# Patient Record
Sex: Female | Born: 1965 | Hispanic: No | Marital: Married | Smoking: Never smoker
Health system: Southern US, Community
[De-identification: ages and names within clinical notes are randomized; demographics above are authoritative.]

## PROBLEM LIST (undated history)

## (undated) DIAGNOSIS — E119 Type 2 diabetes mellitus without complications: Secondary | ICD-10-CM

## (undated) DIAGNOSIS — N200 Calculus of kidney: Secondary | ICD-10-CM

## (undated) HISTORY — PX: APPENDECTOMY: SHX54

---

## 1999-04-11 ENCOUNTER — Ambulatory Visit (HOSPITAL_COMMUNITY): Admission: RE | Admit: 1999-04-11 | Discharge: 1999-04-11 | Payer: Self-pay | Admitting: *Deleted

## 1999-06-27 ENCOUNTER — Encounter: Admission: RE | Admit: 1999-06-27 | Discharge: 1999-09-25 | Payer: Self-pay | Admitting: Obstetrics & Gynecology

## 1999-07-05 ENCOUNTER — Encounter: Admission: RE | Admit: 1999-07-05 | Discharge: 1999-07-05 | Payer: Self-pay | Admitting: Obstetrics & Gynecology

## 1999-07-12 ENCOUNTER — Encounter: Admission: RE | Admit: 1999-07-12 | Discharge: 1999-07-12 | Payer: Self-pay | Admitting: Obstetrics & Gynecology

## 1999-07-19 ENCOUNTER — Encounter: Admission: RE | Admit: 1999-07-19 | Discharge: 1999-07-19 | Payer: Self-pay | Admitting: Obstetrics & Gynecology

## 1999-07-26 ENCOUNTER — Encounter: Admission: RE | Admit: 1999-07-26 | Discharge: 1999-07-26 | Payer: Self-pay | Admitting: Obstetrics & Gynecology

## 1999-08-02 ENCOUNTER — Encounter (HOSPITAL_COMMUNITY): Admission: RE | Admit: 1999-08-02 | Discharge: 1999-09-01 | Payer: Self-pay | Admitting: Obstetrics & Gynecology

## 1999-08-02 ENCOUNTER — Encounter: Admission: RE | Admit: 1999-08-02 | Discharge: 1999-08-02 | Payer: Self-pay | Admitting: Obstetrics & Gynecology

## 1999-08-09 ENCOUNTER — Encounter: Admission: RE | Admit: 1999-08-09 | Discharge: 1999-08-09 | Payer: Self-pay | Admitting: Obstetrics & Gynecology

## 1999-08-16 ENCOUNTER — Encounter: Admission: RE | Admit: 1999-08-16 | Discharge: 1999-08-16 | Payer: Self-pay | Admitting: Obstetrics & Gynecology

## 1999-08-23 ENCOUNTER — Encounter: Admission: RE | Admit: 1999-08-23 | Discharge: 1999-08-23 | Payer: Self-pay | Admitting: Obstetrics & Gynecology

## 1999-08-28 ENCOUNTER — Inpatient Hospital Stay (HOSPITAL_COMMUNITY): Admission: AD | Admit: 1999-08-28 | Discharge: 1999-09-01 | Payer: Self-pay | Admitting: *Deleted

## 2000-01-18 ENCOUNTER — Encounter: Admission: RE | Admit: 2000-01-18 | Discharge: 2000-01-18 | Payer: Self-pay | Admitting: Surgery

## 2000-01-18 ENCOUNTER — Encounter: Payer: Self-pay | Admitting: Surgery

## 2000-02-02 ENCOUNTER — Observation Stay (HOSPITAL_COMMUNITY): Admission: RE | Admit: 2000-02-02 | Discharge: 2000-02-03 | Payer: Self-pay | Admitting: Surgery

## 2000-02-02 ENCOUNTER — Encounter: Payer: Self-pay | Admitting: Surgery

## 2000-02-02 ENCOUNTER — Encounter (INDEPENDENT_AMBULATORY_CARE_PROVIDER_SITE_OTHER): Payer: Self-pay | Admitting: Specialist

## 2001-01-14 ENCOUNTER — Encounter: Admission: RE | Admit: 2001-01-14 | Discharge: 2001-01-14 | Payer: Self-pay | Admitting: Obstetrics & Gynecology

## 2003-01-28 ENCOUNTER — Encounter: Admission: RE | Admit: 2003-01-28 | Discharge: 2003-01-28 | Payer: Self-pay | Admitting: Obstetrics and Gynecology

## 2003-01-28 ENCOUNTER — Other Ambulatory Visit: Admission: RE | Admit: 2003-01-28 | Discharge: 2003-01-28 | Payer: Self-pay | Admitting: *Deleted

## 2003-02-11 ENCOUNTER — Encounter: Admission: RE | Admit: 2003-02-11 | Discharge: 2003-02-11 | Payer: Self-pay | Admitting: Family Medicine

## 2009-01-13 ENCOUNTER — Emergency Department (HOSPITAL_COMMUNITY): Admission: EM | Admit: 2009-01-13 | Discharge: 2009-01-14 | Payer: Self-pay | Admitting: Emergency Medicine

## 2011-02-15 LAB — GLUCOSE, CAPILLARY
Glucose-Capillary: 239 mg/dL — ABNORMAL HIGH (ref 70–99)
Glucose-Capillary: 270 mg/dL — ABNORMAL HIGH (ref 70–99)
Glucose-Capillary: 57 mg/dL — ABNORMAL LOW (ref 70–99)

## 2011-02-15 LAB — COMPREHENSIVE METABOLIC PANEL
ALT: 20 U/L (ref 0–35)
AST: 28 U/L (ref 0–37)
Albumin: 4.1 g/dL (ref 3.5–5.2)
Alkaline Phosphatase: 113 U/L (ref 39–117)
Potassium: 3.5 mEq/L (ref 3.5–5.1)
Sodium: 142 mEq/L (ref 135–145)
Total Protein: 7.3 g/dL (ref 6.0–8.3)

## 2011-02-15 LAB — URINALYSIS, ROUTINE W REFLEX MICROSCOPIC
Bilirubin Urine: NEGATIVE
Glucose, UA: >1000 mg/dL — AB
Hgb urine dipstick: NEGATIVE
Ketones, ur: NEGATIVE mg/dL
Leukocytes, UA: NEGATIVE
Nitrite: NEGATIVE
Protein, ur: NEGATIVE mg/dL
Specific Gravity, Urine: 1.02 (ref 1.005–1.030)
Urobilinogen, UA: 1 mg/dL (ref 0.0–1.0)
pH: 6 (ref 5.0–8.0)

## 2011-02-15 LAB — CBC
Platelets: 290 10*3/uL (ref 150–400)
RDW: 12.6 % (ref 11.5–15.5)

## 2011-02-15 LAB — URINE CULTURE: Colony Count: >=100000

## 2011-02-15 LAB — URINE MICROSCOPIC-ADD ON

## 2011-02-15 LAB — DIFFERENTIAL
Basophils Relative: 0 % (ref 0–1)
Eosinophils Absolute: 0 10*3/uL (ref 0.0–0.7)
Monocytes Absolute: 0.3 10*3/uL (ref 0.1–1.0)
Monocytes Relative: 6 % (ref 3–12)

## 2012-10-14 ENCOUNTER — Other Ambulatory Visit (HOSPITAL_COMMUNITY): Payer: Self-pay | Admitting: Physician Assistant

## 2012-10-14 DIAGNOSIS — Z1231 Encounter for screening mammogram for malignant neoplasm of breast: Secondary | ICD-10-CM

## 2012-11-04 ENCOUNTER — Ambulatory Visit (HOSPITAL_COMMUNITY)
Admission: RE | Admit: 2012-11-04 | Discharge: 2012-11-04 | Disposition: A | Discharge status: Home / Self Care | Payer: Self-pay | Source: Ambulatory Visit | Attending: Physician Assistant | Admitting: Physician Assistant

## 2012-11-04 DIAGNOSIS — Z1231 Encounter for screening mammogram for malignant neoplasm of breast: Secondary | ICD-10-CM

## 2013-05-20 ENCOUNTER — Telehealth: Payer: Self-pay | Admitting: Endocrinology

## 2013-05-20 NOTE — Telephone Encounter (Signed)
Can you please call the patient and find out what dose of the Lantus and Novolog she is on, she only speaks spanish,

## 2013-05-26 ENCOUNTER — Telehealth: Payer: Self-pay | Admitting: Endocrinology

## 2013-05-26 MED ORDER — INSULIN GLARGINE 100 UNIT/ML SOLOSTAR PEN: 25.0000 [IU] | PEN_INJECTOR | Freq: Every day | SUBCUTANEOUS | Status: DC

## 2013-05-26 MED ORDER — INSULIN ASPART 100 UNIT/ML FLEXPEN: 20.0000 [IU] | PEN_INJECTOR | Freq: Three times a day (TID) | SUBCUTANEOUS | Status: DC

## 2013-05-29 ENCOUNTER — Other Ambulatory Visit: Payer: Self-pay | Admitting: *Deleted

## 2013-06-02 ENCOUNTER — Telehealth: Payer: Self-pay | Admitting: *Deleted

## 2013-06-02 NOTE — Telephone Encounter (Signed)
Instructed pt to go back to pharmacy and show her insurance card, her copay should not be as much as she states it was.

## 2013-07-02 ENCOUNTER — Telehealth: Payer: Self-pay | Admitting: Endocrinology

## 2013-07-02 NOTE — Telephone Encounter (Signed)
Needs samples of Novolog and Lantus. Please call / Sherri S.

## 2013-07-02 NOTE — Telephone Encounter (Signed)
Pt will call tomorrow for samples, no lantus today

## 2013-07-03 ENCOUNTER — Telehealth: Payer: Self-pay | Admitting: Endocrinology

## 2013-07-03 NOTE — Telephone Encounter (Signed)
Call #2 - pt calling again for Novolog and Lantus samples. Please call / Sherri

## 2013-07-10 ENCOUNTER — Other Ambulatory Visit: Payer: Self-pay | Admitting: *Deleted

## 2013-07-10 DIAGNOSIS — E1065 Type 1 diabetes mellitus with hyperglycemia: Secondary | ICD-10-CM | POA: Insufficient documentation

## 2013-07-10 DIAGNOSIS — E119 Type 2 diabetes mellitus without complications: Secondary | ICD-10-CM

## 2013-07-10 DIAGNOSIS — E109 Type 1 diabetes mellitus without complications: Secondary | ICD-10-CM | POA: Insufficient documentation

## 2013-07-13 ENCOUNTER — Other Ambulatory Visit: Payer: Self-pay | Admitting: Endocrinology

## 2013-07-14 ENCOUNTER — Other Ambulatory Visit (INDEPENDENT_AMBULATORY_CARE_PROVIDER_SITE_OTHER): Payer: BC Managed Care – PPO

## 2013-07-14 ENCOUNTER — Encounter: Payer: Self-pay | Admitting: Endocrinology

## 2013-07-14 ENCOUNTER — Ambulatory Visit (INDEPENDENT_AMBULATORY_CARE_PROVIDER_SITE_OTHER): Payer: BC Managed Care – PPO | Admitting: Endocrinology

## 2013-07-14 VITALS — BP 124/80 | HR 73 | Temp 97.9°F | Resp 10 | Ht <= 58 in | Wt 118.5 lb

## 2013-07-14 DIAGNOSIS — E119 Type 2 diabetes mellitus without complications: Secondary | ICD-10-CM

## 2013-07-14 DIAGNOSIS — E0590 Thyrotoxicosis, unspecified without thyrotoxic crisis or storm: Secondary | ICD-10-CM

## 2013-07-14 DIAGNOSIS — E785 Hyperlipidemia, unspecified: Secondary | ICD-10-CM

## 2013-07-14 DIAGNOSIS — E782 Mixed hyperlipidemia: Secondary | ICD-10-CM | POA: Insufficient documentation

## 2013-07-14 LAB — MICROALBUMIN / CREATININE URINE RATIO: Microalb Creat Ratio: 3.3 mg/g (ref 0.0–30.0)

## 2013-07-14 LAB — COMPREHENSIVE METABOLIC PANEL
CO2: 28 mEq/L (ref 19–32)
Calcium: 9.5 mg/dL (ref 8.4–10.5)
GFR: 109.65 mL/min (ref >60.00)
Glucose, Bld: 201 mg/dL — ABNORMAL HIGH (ref 70–99)
Sodium: 140 mEq/L (ref 135–145)
Total Bilirubin: 0.5 mg/dL (ref 0.3–1.2)
Total Protein: 7.6 g/dL (ref 6.0–8.3)

## 2013-07-14 LAB — URINALYSIS
Bilirubin Urine: NEGATIVE
Hgb urine dipstick: NEGATIVE
Leukocytes, UA: NEGATIVE
Nitrite: NEGATIVE
Urobilinogen, UA: 0.2 (ref 0.0–1.0)
pH: 6 (ref 5.0–8.0)

## 2013-07-14 MED ORDER — ATORVASTATIN CALCIUM 20 MG PO TABS: 20.0000 mg | ORAL_TABLET | Freq: Every day | ORAL | Status: DC

## 2013-07-14 NOTE — Patient Instructions (Addendum)
Please check blood sugars at least half the time about 2 hours after any meal and as directed on waking up. Please bring blood sugar monitor to each visit  CHANGE LANTUS TO DINNER TIME AND INCREASE TO 28 UNITS, if am sugar still over 150 go uo 2 more units  Check insurance coverage for Humalog, Apidra, Levemir and call if better covered  Add a protein like eggs for breakfast

## 2013-07-14 NOTE — Progress Notes (Signed)
Patient ID: Gabrielle Waller, female   DOB: 1966/08/05, 47 y.o.   MRN: [id number]  Reason for Appointment : Follow up for Type 1 Diabetes  History of Present Illness          Diagnosis: Type 1 diabetes mellitus, date of diagnosis: 2001         Past history: Her blood sugar at diagnosis was 1056 and was started on insulin She is typically having difficulty controlling her diabetes because of cost of her medications and compliance She is also having relatively labile blood sugars Has difficulty understanding instructions for day-to-day management for diabetes and although she claims to be compliant with her insulin doses as prescribed she does not often check her blood sugars as directed and not clear how her diet affects her blood sugars Also because of insurance issues she is not regular with her followup  INSULIN regimen is described as: Lantus 25 units daily, NovoLog 20 units before meals  Recent history: She is checking blood sugars mostly in the morning and these appear to be high. She does not think she is missing her Lantus insulin a usually and is able to get samples for this. Also does not think she is skipping her suppertime coverage or eating late at night    Glucose monitoring:  done times a day         Glucometer: One Touch.      Blood Glucose readings from meter download: readings before breakfast: 154-258 with median 220, afternoon on 8/28: 113  and 81, no readings after supper        Hypoglycemia:  none recently, may occasionally feel a little hypoglycemic if late for a meal Self-care: The diet that the patient has been following is: Usually trying to cut back on carbohydrates  Meals: 2 meals per day. Only bread in am without any protein; lunch 2 pm and dinner 6 p.m.           Physical activity: exercise: She is trying to walk daily this does not cause low sugars.          Dietician visit: Most recent:. ?         Retinal exam: Most recent: ?   No results found for this basename:  HGBA1C    No results found for this basename: Concepcion [place]      Medication List       This list is accurate as of: 07/14/13 11:02 AM.  Always use your most recent med list.               insulin aspart 100 UNIT/ML Sopn FlexPen  Commonly known as:  NOVOLOG FLEXPEN  Inject 20 Units into the skin 3 (three) times daily with meals.     Insulin Glargine 100 UNIT/ML Sopn  Commonly known as:  LANTUS SOLOSTAR  Inject 25 Units into the skin daily.     ONE TOUCH ULTRA TEST test strip  Generic drug:  glucose blood  1 each by Other route as needed for other. Use as instructed        Allergies:  Allergies  Allergen Reactions  . Penicillins Itching    No past medical history on file.  No past surgical history on file.  No family history on file.  Social History:  reports that she has never smoked. She has never used smokeless tobacco. Her alcohol and drug histories are not on file.    Review of Systems:   She has a history of hyperthyroidism  treated with Tapazole in 2001  She has had significant hyperlipidemia including high triglycerides, not taking Crestor because of cost   Physical Examination:  BP 124/80  Pulse 73  Temp(Src) 97.9 F (36.6 C)  Resp 10  Ht 4\' 10"  (1.473 m)  Wt 118 lb 8 oz (53.751 kg)  BMI 24.77 kg/m2  SpO2 98%         ASSESSMENT:  Diabetes type 1: She has significantly higher fasting blood sugars and probably needs more Lantus insulin. Not clear if she is taking this regularly because of cost issues Also has some difficulty following instructions for glucose monitoring, mealtime insulin and consistent meal planning She does appear to have less language barrier to understanding but not able to follow directions consistently  Complications: None  Hyperlipidemia: Currently not on treatment History of hyperthyroidism several years ago with history of mild persistently low TSH levels  PLAN:   Increase Lantus to 28. Discussed needing  to adjust this periodically to keep morning sugar is below 140 Also discussed adding a protein to breakfast daily She was started getting blood sugar test at in between meals and bedtime to help adjust her mealtime dose She will be given samples and assistance with coupons etc. for her medications To check A1c, chemistry panel and urine microalbumin today She will try generic Lipitor for hypercholesterolemia and recheck lipids on treatment  Also will need followup of her thyroid functions  Counseling time over 50% of today's 25 minute visit  Aureliano Oshields 07/14/2013, 11:02 AM   Appointment on 07/14/2013  Component Date Value Range Status  . Hemoglobin A1C 07/14/2013 9.4* 4.6 - 6.5 % Final   Glycemic Control Guidelines for People with Diabetes:Non Diabetic:  <6%Goal of Therapy: <7%Additional Action Suggested:  >8%   . Sodium 07/14/2013 140  135 - 145 mEq/L Final  . Potassium 07/14/2013 3.8  3.5 - 5.1 mEq/L Final  . Chloride 07/14/2013 106  96 - 112 mEq/L Final  . CO2 07/14/2013 28  19 - 32 mEq/L Final  . Glucose, Bld 07/14/2013 201* 70 - 99 mg/dL Final  . BUN 40/98/1191 13  6 - 23 mg/dL Final  . Creatinine, Ser 07/14/2013 0.6  0.4 - 1.2 mg/dL Final  . Total Bilirubin 07/14/2013 0.5  0.3 - 1.2 mg/dL Final  . Alkaline Phosphatase 07/14/2013 93  39 - 117 U/L Final  . AST 07/14/2013 13  0 - 37 U/L Final  . ALT 07/14/2013 14  0 - 35 U/L Final  . Total Protein 07/14/2013 7.6  6.0 - 8.3 g/dL Final  . Albumin 47/82/9562 4.0  3.5 - 5.2 g/dL Final  . Calcium 13/06/6577 9.5  8.4 - 10.5 mg/dL Final  . GFR 46/96/2952 109.65  >60.00 mL/min Final  . Color, Urine 07/14/2013 LT. YELLOW  Yellow;Lt. Yellow Final  . APPearance 07/14/2013 CLEAR  Clear Final  . Specific Gravity, Urine 07/14/2013 1.025  1.000-1.030 Final  . pH 07/14/2013 6.0  5.0 - 8.0 Final  . Total Protein, Urine 07/14/2013 NEGATIVE  Negative Final  . Urine Glucose 07/14/2013 500  Negative Final  . Ketones, ur 07/14/2013 NEGATIVE   Negative Final  . Bilirubin Urine 07/14/2013 NEGATIVE  Negative Final  . Hgb urine dipstick 07/14/2013 NEGATIVE  Negative Final  . Urobilinogen, UA 07/14/2013 0.2  0.0 - 1.0 Final  . Leukocytes, UA 07/14/2013 NEGATIVE  Negative Final  . Nitrite 07/14/2013 NEGATIVE  Negative Final  . Microalb, Ur 07/14/2013 4.0* 0.0 - 1.9 mg/dL Final  . Creatinine,U 84/13/2440 119.7  Final  . Microalb Creat Ratio 07/14/2013 3.3  0.0 - 30.0 mg/g Final

## 2013-07-16 DIAGNOSIS — E0590 Thyrotoxicosis, unspecified without thyrotoxic crisis or storm: Secondary | ICD-10-CM | POA: Insufficient documentation

## 2013-07-20 ENCOUNTER — Telehealth: Payer: Self-pay | Admitting: Endocrinology

## 2013-08-17 ENCOUNTER — Other Ambulatory Visit: Payer: Self-pay | Admitting: *Deleted

## 2013-08-17 MED ORDER — INSULIN ASPART 100 UNIT/ML FLEXPEN: 20.0000 [IU] | PEN_INJECTOR | Freq: Three times a day (TID) | SUBCUTANEOUS | Status: DC

## 2013-08-17 MED ORDER — INSULIN GLARGINE 100 UNIT/ML SOLOSTAR PEN: 25.0000 [IU] | PEN_INJECTOR | Freq: Every day | SUBCUTANEOUS | Status: DC

## 2013-08-24 ENCOUNTER — Telehealth: Payer: Self-pay | Admitting: Endocrinology

## 2013-08-24 NOTE — Telephone Encounter (Signed)
2 samples of novolog left for patient

## 2013-08-24 NOTE — Telephone Encounter (Signed)
Pt calling for Lantus samples. Please call - Sherri

## 2013-09-11 ENCOUNTER — Other Ambulatory Visit (INDEPENDENT_AMBULATORY_CARE_PROVIDER_SITE_OTHER): Payer: BC Managed Care – PPO

## 2013-09-11 DIAGNOSIS — E119 Type 2 diabetes mellitus without complications: Secondary | ICD-10-CM

## 2013-09-11 DIAGNOSIS — E785 Hyperlipidemia, unspecified: Secondary | ICD-10-CM

## 2013-09-11 DIAGNOSIS — E0590 Thyrotoxicosis, unspecified without thyrotoxic crisis or storm: Secondary | ICD-10-CM

## 2013-09-11 LAB — COMPREHENSIVE METABOLIC PANEL
Albumin: 4 g/dL (ref 3.5–5.2)
CO2: 25 mEq/L (ref 19–32)
Chloride: 108 mEq/L (ref 96–112)
GFR: 111.65 mL/min (ref >60.00)
Glucose, Bld: 157 mg/dL — ABNORMAL HIGH (ref 70–99)
Potassium: 4.2 mEq/L (ref 3.5–5.1)
Sodium: 140 mEq/L (ref 135–145)
Total Protein: 7.5 g/dL (ref 6.0–8.3)

## 2013-09-11 LAB — LIPID PANEL: Cholesterol: 320 mg/dL — ABNORMAL HIGH (ref 0–200)

## 2013-09-14 ENCOUNTER — Encounter: Payer: Self-pay | Admitting: Endocrinology

## 2013-09-14 ENCOUNTER — Ambulatory Visit (INDEPENDENT_AMBULATORY_CARE_PROVIDER_SITE_OTHER): Payer: BC Managed Care – PPO | Admitting: Endocrinology

## 2013-09-14 VITALS — BP 130/88 | HR 84 | Temp 98.2°F | Resp 12 | Ht 60.0 in | Wt 120.2 lb

## 2013-09-14 DIAGNOSIS — E785 Hyperlipidemia, unspecified: Secondary | ICD-10-CM

## 2013-09-14 DIAGNOSIS — E1065 Type 1 diabetes mellitus with hyperglycemia: Secondary | ICD-10-CM

## 2013-09-14 NOTE — Progress Notes (Signed)
Patient ID: Gabrielle Waller, female   DOB: May 06, 1966, 47 y.o.   MRN: [id number]  Reason for Appointment : Follow up for Type 1 Diabetes  History of Present Illness          Diagnosis: Type 1 diabetes mellitus, date of diagnosis: 2001         Past history: Her blood sugar at diagnosis was 1056 and was started on insulin She is typically having difficulty controlling her diabetes because of cost of her medications and compliance She is also having relatively labile blood sugars Has difficulty understanding instructions for day-to-day management for diabetes and although she claims to be compliant with her insulin doses as prescribed she does not often check her blood sugars as directed and not clear how her diet affects her blood sugars  INSULIN regimen is described as: Lantus 28 units daily, NovoLog 20 units before meals  Recent history: She is again checking blood sugars mostly in the morning and these are mostly high although variable.  She does not think she is forgetting her Lantus insulin although is still very concerned about the cost She is taking about the same amount of mealtime coverage even though her carbohydrate intake is variable and less at breakfast Not clear why she had a tendency to occasional hypoglycemia late evening      Glucose monitoring:  done 0.6   times a day         Glucometer: One Touch.      Blood Glucose readings from meter download; glucose before breakfast: 153- 356, today 307 with average about 260 Midday 123, 308, 4 PM 130 About 8-9 PM 68-239 and 11 PM 44-127  Hypoglycemia:  none recently, may occasionally feel a little hypoglycemic if late for a meal Self-care: The diet that the patient has been following is: Usually trying to cut back on carbohydrates  Meals: 2 meals per day. Only bread in am without any protein; lunch 2 pm and dinner 6 p.m.           Physical activity: exercise: She is trying to walk daily this does not cause low sugars.          Dietician  visit: Most recent:. ?         Retinal exam: Most recent: ?   Lab Results  Component Value Date   HGBA1C 9.4* 07/14/2013    Lab Results  Component Value Date   MICROALBUR 4.0* 07/14/2013      Medication List       This list is accurate as of: 09/14/13  8:54 AM.  Always use your most recent med list.               atorvastatin 20 MG tablet  Commonly known as:  LIPITOR  Take 1 tablet (20 mg total) by mouth daily.     insulin aspart 100 UNIT/ML Sopn FlexPen  Commonly known as:  NOVOLOG FLEXPEN  Inject 20 Units into the skin 3 (three) times daily with meals.     Insulin Glargine 100 UNIT/ML Sopn  Commonly known as:  LANTUS SOLOSTAR  Inject 25 Units into the skin daily.     ONE TOUCH ULTRA TEST test strip  Generic drug:  glucose blood  1 each by Other route as needed for other. Use as instructed        Allergies:  Allergies  Allergen Reactions  . Penicillins Itching    No past medical history on file.  No past surgical history on file.  No family history on file.  Social History:  reports that she has never smoked. She has never used smokeless tobacco. Her alcohol and drug histories are not on file.    Review of Systems:   She has a history of hyperthyroidism treated with Tapazole in 2001, Continues to have relatively low TSH without recurrence of hyperthyroidism   She has had significant hyperlipidemia including high triglycerides, not taking Lipitor because of cost, LDL is 207   She is asking about some hearing difficulties  Physical Examination:  BP 130/88  Pulse 84  Temp(Src) 98.2 F (36.8 C)  Resp 12  Ht 5' (1.524 m)  Wt 120 lb 3.2 oz (54.522 kg)  BMI 23.47 kg/m2  SpO2 98%         ASSESSMENT/PLAN:   Diabetes type 1:   Blood sugars are still variably controlled especially in the morning Not clear why some days her blood sugars are very high in the morning, may be partially from bedtime snacks or possibly inconsistent use of either of her  insulin this because of cost She has not done enough readings after breakfast or lunch to help adjust the NovoLog However blood sugars at bedtime are usually normal or slightly low  For now will split her Lantus to twice a day to help better overnight control Since she is not eating as much at breakfast will reduce her NovoLog to 15 and also at suppertime by the same amount She will continue 20 units of insulin at lunch and try to check more readings after lunch also Given her co-pay card for her both her insulins to help with cost  HYPERCHOLESTEROLEMIA: She is still not taking her medication because of cost and she will check with her insurance about coverage Also will call to see if her prescription can be covered at the health department  To check A1c, lipids and renal function on next visit   [facility] 09/14/2013, 8:54 AM   Appointment on 09/11/2013  Component Date Value Range Status  . Free T4 09/11/2013 0.67  0.60 - 1.60 ng/dL Final  . TSH 16/08/9603 0.22* 0.35 - 5.50 uIU/mL Final  . Fructosamine 09/11/2013 369* <285 umol/L Final   Comment:                            Variations in levels of serum proteins (albumin and immunoglobulins)                          may affect fructosamine results.                             . Sodium 09/11/2013 140  135 - 145 mEq/L Final  . Potassium 09/11/2013 4.2  3.5 - 5.1 mEq/L Final  . Chloride 09/11/2013 108  96 - 112 mEq/L Final  . CO2 09/11/2013 25  19 - 32 mEq/L Final  . Glucose, Bld 09/11/2013 157* 70 - 99 mg/dL Final  . BUN 54/07/8118 14  6 - 23 mg/dL Final  . Creatinine, Ser 09/11/2013 0.6  0.4 - 1.2 mg/dL Final  . Total Bilirubin 09/11/2013 0.5  0.3 - 1.2 mg/dL Final  . Alkaline Phosphatase 09/11/2013 93  39 - 117 U/L Final  . AST 09/11/2013 16  0 - 37 U/L Final  . ALT 09/11/2013 16  0 - 35 U/L Final  . Total Protein 09/11/2013 7.5  6.0 - 8.3 g/dL Final  . Albumin 09/81/1914 4.0  3.5 - 5.2 g/dL Final  . Calcium 78/29/5621 9.8   8.4 - 10.5 mg/dL Final  . GFR 30/86/5784 111.65  >60.00 mL/min Final  . Cholesterol 09/11/2013 320* 0 - 200 mg/dL Final   ATP III Classification       Desirable:  < 200 mg/dL               Borderline High:  200 - 239 mg/dL          High:  > = 696 mg/dL  . Triglycerides 09/11/2013 286.0* 0.0 - 149.0 mg/dL Final   Normal:  <295 mg/dLBorderline High:  150 - 199 mg/dL  . HDL 09/11/2013 35.50* >39.00 mg/dL Final  . VLDL 28/41/3244 57.2* 0.0 - 40.0 mg/dL Final  . Total CHOL/HDL Ratio 09/11/2013 9   Final                  Men          Women1/2 Average Risk     3.4          3.3Average Risk          5.0          4.42X Average Risk          9.6          7.13X Average Risk          15.0          11.0                      . Direct LDL 09/11/2013 207.2   Final   Optimal:  <100 mg/dLNear or Above Optimal:  100-129 mg/dLBorderline High:  130-159 mg/dLHigh:  160-189 mg/dLVery High:  >190 mg/dL

## 2013-09-14 NOTE — Patient Instructions (Signed)
LANTUS 24 IN AM AND 6 AT DINNER  NOVOLOG AT BREAKFAST 15 UNITS, 20 AT LUNCH AND 15 AT DINNER  START ATORVASTATIN FOR high cholesterol

## 2013-09-17 NOTE — Telephone Encounter (Signed)
Phone note completed ° °

## 2013-10-19 ENCOUNTER — Emergency Department (HOSPITAL_COMMUNITY)
Admission: EM | Admit: 2013-10-19 | Discharge: 2013-10-19 | Disposition: A | Discharge status: Home / Self Care | Payer: BC Managed Care – PPO | Source: Home / Self Care | Attending: Emergency Medicine | Admitting: Emergency Medicine

## 2013-10-19 ENCOUNTER — Encounter (HOSPITAL_COMMUNITY): Payer: Self-pay | Admitting: Emergency Medicine

## 2013-10-19 DIAGNOSIS — J329 Chronic sinusitis, unspecified: Secondary | ICD-10-CM

## 2013-10-19 HISTORY — DX: Type 2 diabetes mellitus without complications: E11.9

## 2013-10-19 MED ORDER — AMOXICILLIN-POT CLAVULANATE 875-125 MG PO TABS: 1.0000 | ORAL_TABLET | Freq: Two times a day (BID) | ORAL | Status: DC

## 2013-10-19 NOTE — ED Notes (Signed)
Cough, congestion, blowing green from nose and it is blood tinged.  Patient has a sore throat.  Denies fever

## 2013-10-19 NOTE — ED Provider Notes (Signed)
Medical screening examination/treatment/procedure(s) were performed by non-physician practitioner and as supervising physician I was immediately available for consultation/collaboration.  Leslee Home, M.D.  Reuben Likes, MD 10/19/13 [phone]

## 2013-10-19 NOTE — ED Provider Notes (Signed)
CSN: [id number]     Arrival date & time 10/19/13  0840 History   First MD Initiated Contact with Patient 10/19/13 1012     Chief Complaint  Patient presents with  . URI   (Consider location/radiation/quality/duration/timing/severity/associated sxs/prior Treatment) HPI Comments: Pt reports nasal drainage is green  Patient is a 47 y.o. female presenting with URI. The history is provided by the patient.  URI Presenting symptoms: congestion and cough   Presenting symptoms: no facial pain, no fever, no rhinorrhea and no sore throat   Severity:  Moderate Onset quality:  Gradual Duration:  2 weeks Timing:  Constant Progression:  Worsening Chronicity:  New Relieved by:  Nothing Worsened by:  Nothing tried Ineffective treatments:  None tried Associated symptoms: headaches and sneezing   Associated symptoms: no sinus pain, no swollen glands and no wheezing   Risk factors: diabetes mellitus     Past Medical History  Diagnosis Date  . Diabetes mellitus without complication    History reviewed. No pertinent past surgical history. History reviewed. No pertinent family history. History  Substance Use Topics  . Smoking status: Never Smoker   . Smokeless tobacco: Never Used  . Alcohol Use: No   OB History   Grav Para Term Preterm Abortions TAB SAB Ect Mult Living                 Review of Systems  Constitutional: Negative for fever and chills.  HENT: Positive for congestion, postnasal drip and sneezing. Negative for rhinorrhea, sinus pressure and sore throat.   Respiratory: Positive for cough. Negative for shortness of breath and wheezing.   Neurological: Positive for headaches.    Allergies  Penicillins  Home Medications   Current Outpatient Rx  Name  Route  Sig  Dispense  Refill  . amoxicillin-clavulanate (AUGMENTIN) 875-125 MG per tablet   Oral   Take 1 tablet by mouth 2 (two) times daily.   20 tablet   0   . atorvastatin (LIPITOR) 20 MG tablet   Oral   Take 1  tablet (20 mg total) by mouth daily.   30 tablet   3   . glucose blood (ONE TOUCH ULTRA TEST) test strip   Other   1 each by Other route as needed for other. Use as instructed         . insulin aspart (NOVOLOG FLEXPEN) 100 UNIT/ML SOPN FlexPen   Subcutaneous   Inject 20 Units into the skin 3 (three) times daily with meals.   5 pen   5   . Insulin Glargine (LANTUS SOLOSTAR) 100 UNIT/ML SOPN   Subcutaneous   Inject 25 Units into the skin daily.   5 pen   5    BP 133/82  Pulse 84  Temp(Src) 98.6 F (37 C) (Oral)  Resp 16  SpO2 99% Physical Exam  Constitutional: She appears well-developed and well-nourished. No distress.  HENT:  Right Ear: External ear normal.  Left Ear: External ear normal.  Nose: Mucosal edema present. No rhinorrhea. Right sinus exhibits no maxillary sinus tenderness and no frontal sinus tenderness. Left sinus exhibits no maxillary sinus tenderness and no frontal sinus tenderness.  Mouth/Throat: Oropharynx is clear and moist and mucous membranes are normal.  B ear canals with cerumen impaction. Purulent drainage in nose.   Cardiovascular: Normal rate and regular rhythm.   Pulmonary/Chest: Effort normal and breath sounds normal.    ED Course  Procedures (including critical care time) Labs Review Labs Reviewed - No data to  display Imaging Review No results found.  EKG Interpretation    Date/Time:    Ventricular Rate:    PR Interval:    QRS Duration:   QT Interval:    QTC Calculation:   R Axis:     Text Interpretation:              MDM   1. Sinusitis   rx augmentin 875/125 BID #20. Recommended half strength peroxide in ear canals daily for 2 weeks for cerumen.     Cathlyn Parsons, NP 10/19/13 1018

## 2013-11-16 ENCOUNTER — Ambulatory Visit: Payer: BC Managed Care – PPO | Admitting: Endocrinology

## 2013-12-02 ENCOUNTER — Encounter: Payer: Self-pay | Admitting: Endocrinology

## 2013-12-02 ENCOUNTER — Other Ambulatory Visit: Payer: Self-pay | Admitting: *Deleted

## 2013-12-02 ENCOUNTER — Ambulatory Visit (INDEPENDENT_AMBULATORY_CARE_PROVIDER_SITE_OTHER): Payer: BC Managed Care – PPO | Admitting: Endocrinology

## 2013-12-02 VITALS — BP 124/72 | HR 88 | Temp 98.2°F | Resp 14 | Ht 60.0 in | Wt 120.0 lb

## 2013-12-02 DIAGNOSIS — E0590 Thyrotoxicosis, unspecified without thyrotoxic crisis or storm: Secondary | ICD-10-CM

## 2013-12-02 DIAGNOSIS — L659 Nonscarring hair loss, unspecified: Secondary | ICD-10-CM

## 2013-12-02 DIAGNOSIS — E785 Hyperlipidemia, unspecified: Secondary | ICD-10-CM

## 2013-12-02 DIAGNOSIS — IMO0002 Reserved for concepts with insufficient information to code with codable children: Secondary | ICD-10-CM

## 2013-12-02 DIAGNOSIS — E1065 Type 1 diabetes mellitus with hyperglycemia: Secondary | ICD-10-CM

## 2013-12-02 LAB — HEMOGLOBIN A1C: Hgb A1c MFr Bld: 8.7 % — ABNORMAL HIGH (ref 4.6–6.5)

## 2013-12-02 LAB — GLUCOSE, RANDOM: Glucose, Bld: 182 mg/dL — ABNORMAL HIGH (ref 70–99)

## 2013-12-02 LAB — LDL CHOLESTEROL, DIRECT: LDL DIRECT: 194.1 mg/dL

## 2013-12-02 MED ORDER — INSULIN LISPRO 100 UNIT/ML (KWIKPEN)
PEN_INJECTOR | SUBCUTANEOUS | Status: DC
Start: 2013-12-02 — End: 2013-12-28

## 2013-12-02 NOTE — Progress Notes (Signed)
Patient ID: Gabrielle Waller, female   DOB: Oct 21, 1966, 48 y.o.   MRN: [id number]  Reason for Appointment : Follow up for Type 1 Diabetes  History of Present Illness          Diagnosis: Type 1 diabetes mellitus, date of diagnosis: 2001         Past history: Her blood sugar at diagnosis was 1056 and was started on insulin She is typically having difficulty controlling her diabetes because of cost of her medications and compliance She is also having relatively labile blood sugars Has difficulty understanding instructions for day-to-day management for diabetes and although she claims to be compliant with her insulin doses as prescribed she does not often check her blood sugars as directed and not clear how her diet affects her blood sugars   INSULIN regimen is described as: Lantus 28 units daily in am, NovoLog 20 units before meals  Recent history: Her blood sugars are still not well controlled and she appears somewhat unclear about her Lantus doses On her last visit she was asked to take Lantus twice a day but has not done so. She may be reducing her Lantus dose in the morning when blood sugar is near-normal Also she admits that occasionally may forget her Lantus and this may have caused a high reading of 406 on Monday night Generally fasting blood sugars are higher except this morning because she took extra 5 NovoLog at bedtime last night Glucose before supper around 6 PM is also over 200 Blood sugars after supper at night are variable as also after breakfast She is again checking blood sugars somewhat sporadically and mostly before midday Hypoglycemia:  none recently  Glucose monitoring:  done 0.6   times a day         Glucometer: One Touch.      Blood Glucose readings from meter download:   PREMEAL  Mornings  Lunch Dinner Bedtime Overall  Glucose range:  114-288   145, 299   203, 218   86-406    Mean/median:  200     158   204    POST-MEAL PC Breakfast PC Lunch PC Dinner  Glucose range: ?   ?   148, 161   Mean/median:       Self-care: The diet that the patient has been following is: Usually trying to cut back on carbohydrates, generally eating 3 tortillas with dinner  Meals: 3 meals per day. Egg, bread at 9 am; lunch 12 pm and dinner 6 p.m.           Physical activity: exercise: She is trying to walk daily this does not cause low sugars.          Dietician visit: Most recent:. ?         Retinal exam: Most recent: ?   Lab Results  Component Value Date   HGBA1C 9.4* 07/14/2013    Lab Results  Component Value Date   MICROALBUR 4.0* 07/14/2013      Medication List       This list is accurate as of: 12/02/13 11:12 AM.  Always use your most recent med list.               amoxicillin-clavulanate 875-125 MG per tablet  Commonly known as:  AUGMENTIN  Take 1 tablet by mouth 2 (two) times daily.     atorvastatin 20 MG tablet  Commonly known as:  LIPITOR  Take 1 tablet (20 mg total) by mouth daily.  insulin aspart 100 UNIT/ML FlexPen  Commonly known as:  NOVOLOG FLEXPEN  Inject 20 Units into the skin 3 (three) times daily with meals.     Insulin Glargine 100 UNIT/ML Solostar Pen  Commonly known as:  LANTUS SOLOSTAR  Inject 25 Units into the skin daily.     ONE TOUCH ULTRA TEST test strip  Generic drug:  glucose blood  1 each by Other route as needed for other. Use as instructed        Allergies:  Allergies  Allergen Reactions  . Penicillins Itching    Past Medical History  Diagnosis Date  . Diabetes mellitus without complication     No past surgical history on file.  No family history on file.  Social History:  reports that she has never smoked. She has never used smokeless tobacco. She reports that she does not drink alcohol or use illicit drugs.    Review of Systems:   She has a history of hyperthyroidism treated with Tapazole in 2001, Continues to have slightly low TSH without recurrence of hyperthyroidism. No unusual fatigue, only mild  cord intolerance   She has had significant hyperlipidemia including high triglycerides, was not taking Lipitor because of cost on the last visit when LDL was 207   She is asking about hair loss. This has been going on for 3-4 months and is some on the front and also on the back No increased facial hair. 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 No facial hirsutism No edema  ASSESSMENT/PLAN:   Diabetes type 1:   Blood sugars are still variably controlled but on an average still high with median at home 204 Her highest readings are late morning, around 6-7 PM and occasionally late night Again difficult to be sure how compliant she is with all her insulin doses She probably needs more basal insulin and although she was supposed to start twice a day Lantus on her last visit she has not done so Postprandial readings are difficult to analyze since she is not doing many of days and not clear if the late morning readings are after eating; most likely her reading of 406 on Monday night was from forgetting the Lantus in the morning  Recommendations made today as follows (translation made in Spanish also):   Given her co-pay card for a free box of Humalog pens  Increase Lantus to 32 and take it every morning consistently  May go up on her NovoLog by 4 units if eating larger meals are more carbohydrates  More consistent glucose monitoring before or 2 hours after meals  HYPERCHOLESTEROLEMIA: Will check LDL. She thinks she is taking are Lipitor  Hair loss: This appears to be androgenic pattern and will check DHEAS and free testosterone Thyroid levels were not significantly abnormal on her last visit   Kmari Brian 12/02/2013, 11:12 AM

## 2013-12-02 NOTE — Patient Instructions (Signed)
Lantus 32 unidades diarias en despertarse NovoLog 20 unidades antes de cada comida y si comer ms almidn o harina grande tomar 4 unidades ms Compruebe algunos azcares 2 horas despus de las comidas, al menos, una vez al da  Lantus 32 units daily on waking up  NovoLog 20 units before each meal and if eating more starch or bigger meal take 4 units more Check some sugars 2 hours after your meals at least once a day

## 2013-12-03 NOTE — Progress Notes (Signed)
Quick Note:  Cholesterol is very high, need to find out if she is taking her atorvastatin ______

## 2013-12-07 ENCOUNTER — Other Ambulatory Visit (HOSPITAL_COMMUNITY): Payer: Self-pay | Admitting: Physician Assistant

## 2013-12-07 DIAGNOSIS — Z1231 Encounter for screening mammogram for malignant neoplasm of breast: Secondary | ICD-10-CM

## 2013-12-10 LAB — TESTOSTERONE, FREE, TOTAL, SHBG
Testosterone, Free: 0.3 pg/mL (ref 0.0–2.2)
Testosterone, total: 53.1 ng/dL

## 2013-12-10 LAB — DHEA-SULFATE: DHEA SO4: 66.5 ug/dL (ref 41.2–243.7)

## 2013-12-16 ENCOUNTER — Ambulatory Visit (HOSPITAL_COMMUNITY)
Admission: RE | Admit: 2013-12-16 | Discharge: 2013-12-16 | Disposition: A | Discharge status: Home / Self Care | Payer: BC Managed Care – PPO | Source: Ambulatory Visit | Attending: Physician Assistant | Admitting: Physician Assistant

## 2013-12-16 DIAGNOSIS — Z1231 Encounter for screening mammogram for malignant neoplasm of breast: Secondary | ICD-10-CM

## 2013-12-21 ENCOUNTER — Other Ambulatory Visit: Payer: Self-pay | Admitting: *Deleted

## 2013-12-21 MED ORDER — INSULIN GLARGINE 100 UNIT/ML SOLOSTAR PEN: 32.0000 [IU] | PEN_INJECTOR | Freq: Every day | SUBCUTANEOUS | Status: DC

## 2013-12-28 ENCOUNTER — Other Ambulatory Visit: Payer: Self-pay | Admitting: *Deleted

## 2013-12-28 MED ORDER — INSULIN LISPRO 100 UNIT/ML (KWIKPEN): PEN_INJECTOR | SUBCUTANEOUS | Status: DC

## 2014-01-15 ENCOUNTER — Telehealth: Payer: Self-pay | Admitting: Endocrinology

## 2014-01-15 NOTE — Telephone Encounter (Signed)
Pt states that the patient is too early to get her Rx refilled

## 2014-01-15 NOTE — Telephone Encounter (Signed)
She is out of her lantus and novolog and the pharmacy is telling her that she is too early.

## 2014-01-19 NOTE — Telephone Encounter (Signed)
Pt calling again regarding lantus and novolog she is runningout

## 2014-01-20 NOTE — Telephone Encounter (Signed)
I spoke with patient this afternoon, a sample of novolog has been left for her

## 2014-01-21 ENCOUNTER — Telehealth: Payer: Self-pay | Admitting: *Deleted

## 2014-01-21 ENCOUNTER — Telehealth: Payer: Self-pay | Admitting: Endocrinology

## 2014-01-21 ENCOUNTER — Other Ambulatory Visit: Payer: Self-pay | Admitting: *Deleted

## 2014-01-21 NOTE — Telephone Encounter (Signed)
Pt would like to speak with Bjorn LoserRhonda regarding her Rx   Call back: [phone]  Thank You :)

## 2014-01-21 NOTE — Telephone Encounter (Signed)
Patient is unable to afford her lantus and novolog, is there a cheaper alternative for her?

## 2014-01-21 NOTE — Telephone Encounter (Signed)
Patient is having difficulty paying for her Ltus and

## 2014-01-22 ENCOUNTER — Other Ambulatory Visit: Payer: Self-pay | Admitting: *Deleted

## 2014-01-22 MED ORDER — INSULIN ASPART 100 UNIT/ML FLEXPEN: 20.0000 [IU] | PEN_INJECTOR | Freq: Three times a day (TID) | SUBCUTANEOUS | Status: DC

## 2014-01-22 MED ORDER — INSULIN NPH (HUMAN) (ISOPHANE) 100 UNIT/ML ~~LOC~~ SUSP: SUBCUTANEOUS | Status: DC

## 2014-01-22 MED ORDER — INSULIN GLARGINE 100 UNIT/ML SOLOSTAR PEN: 32.0000 [IU] | PEN_INJECTOR | Freq: Every day | SUBCUTANEOUS | Status: DC

## 2014-01-22 MED ORDER — INSULIN REGULAR HUMAN 100 UNIT/ML IJ SOLN: 20.0000 [IU] | Freq: Three times a day (TID) | INTRAMUSCULAR | Status: DC

## 2014-01-22 NOTE — Telephone Encounter (Signed)
She will take Humulin regular 20 units before each meal and instead of Lantus take Humulin NPH 16 units with insulin pen before breakfast and 16 units at bedtime. Will need to see her in 2-3 weeks for followup

## 2014-01-22 NOTE — Telephone Encounter (Signed)
Pt  Calling regarding lantus and novolog. Would like follow up on an alternative

## 2014-01-22 NOTE — Telephone Encounter (Signed)
rx sent, patient aware 

## 2014-03-01 ENCOUNTER — Ambulatory Visit (INDEPENDENT_AMBULATORY_CARE_PROVIDER_SITE_OTHER): Payer: BC Managed Care – PPO | Admitting: Endocrinology

## 2014-03-01 ENCOUNTER — Encounter: Payer: Self-pay | Admitting: Endocrinology

## 2014-03-01 VITALS — BP 124/82 | HR 93 | Temp 97.9°F | Resp 12 | Ht 58.75 in | Wt 119.0 lb

## 2014-03-01 DIAGNOSIS — E0590 Thyrotoxicosis, unspecified without thyrotoxic crisis or storm: Secondary | ICD-10-CM

## 2014-03-01 DIAGNOSIS — IMO0002 Reserved for concepts with insufficient information to code with codable children: Secondary | ICD-10-CM

## 2014-03-01 DIAGNOSIS — E1065 Type 1 diabetes mellitus with hyperglycemia: Secondary | ICD-10-CM

## 2014-03-01 DIAGNOSIS — E785 Hyperlipidemia, unspecified: Secondary | ICD-10-CM

## 2014-03-01 LAB — BASIC METABOLIC PANEL
BUN: 20 mg/dL (ref 6–23)
CALCIUM: 9.6 mg/dL (ref 8.4–10.5)
CO2: 26 mEq/L (ref 19–32)
CREATININE: 0.7 mg/dL (ref 0.4–1.2)
Chloride: 106 mEq/L (ref 96–112)
GFR: 98.3 mL/min (ref >60.00)
GLUCOSE: 287 mg/dL — AB (ref 70–99)
Potassium: 4.1 mEq/L (ref 3.5–5.1)
SODIUM: 140 meq/L (ref 135–145)

## 2014-03-01 LAB — T4, FREE: FREE T4: 0.72 ng/dL (ref 0.60–1.60)

## 2014-03-01 LAB — TSH: TSH: 0.27 u[IU]/mL — ABNORMAL LOW (ref 0.35–5.50)

## 2014-03-01 LAB — HEMOGLOBIN A1C: HEMOGLOBIN A1C: 9.3 % — AB (ref 4.6–6.5)

## 2014-03-01 NOTE — Patient Instructions (Signed)
Humulin N take 20 units on waking up and 24 UNITS AT BEDTIME  HUMULIN R 20 UNITS, 30 MIN BEFORE MEALS  CHECK COST  OF METER  Humulina N tomar 20 unidades en el despertar y 24 UNIDADES EN LA HORA DE DORMIR  HUMULIN R 20 unidades, 30 min antes de las comidas  CONSULTAR COSTO DE BOMBA v-GO

## 2014-03-01 NOTE — Progress Notes (Signed)
Patient ID: Gabrielle Gabrielle Waller, female   DOB: Jul 16, 1966, 48 y.o.   MRN: [id number]   Reason for Appointment : Follow up for Type 1 Diabetes  History of Present Illness          Diagnosis: Type 1 diabetes mellitus, date of diagnosis: 2001         Past history: Her blood sugar at diagnosis was 1056 and was started on insulin She is typically having difficulty controlling her diabetes because of cost of her medications and compliance She is also having relatively labile blood sugars Has difficulty understanding instructions for day-to-day management for diabetes and although she claims to be compliant with her insulin doses as prescribed she does not often check her blood sugars as directed and not clear how her diet affects her blood sugars   INSULIN regimen is described as: NPH 16 twice a day, regular insulin 20 units before meals  Recent history: She was switched to NPH and regular insulin about a month ago because of her wanting a less expensive medication compared to Lantus and Humalog However her blood sugars appear to be much higher especially in the mornings She thinks she is compliant with both the NPH doses and also the regular insulin before meals Now is eating mostly 2 meals a day and she thinks she is taking 20 units of regular before each meal Currently not identifying her postprandial readings and not clear which of her readings are after meals; occasionally may have a normal reading after breakfast and supper Recent A1c not available Hypoglycemia:  none recently  Glucose monitoring:  done 0.6   times a day         Glucometer: One Touch ultra 2.      Blood Glucose readings from meter download:   PREMEAL Breakfast Lunch Dinner Bedtime Overall  Glucose range:  255-410    ? 292   252, 300    Mean/median:      316    POST-MEAL PC Breakfast PC Lunch PC Dinner  Glucose range:  77, 194   93   Mean/median:      Self-care:  Meals: 3 meals per day. Egg, bread at 11 am; lunch usually  skipped and dinner 6 p.m.           Physical activity: exercise: She is trying to walk daily this does not cause low sugars.          Dietician visit: Most recent:. ?         Wt Readings from Last 3 Encounters:  03/01/14 119 lb (53.978 kg)  12/02/13 120 lb (54.432 kg)  09/14/13 120 lb 3.2 oz (54.522 kg)   Retinal exam: Most recent: ?   Lab Results  Component Value Date   HGBA1C 8.7* 12/02/2013    Lab Results  Component Value Date   MICROALBUR 4.0* 07/14/2013      Medication List       This list is accurate as of: 03/01/14  8:17 AM.  Always use your most recent med list.               amoxicillin-clavulanate 875-125 MG per tablet  Commonly known as:  AUGMENTIN  Take 1 tablet by mouth 2 (two) times daily.     atorvastatin 20 MG tablet  Commonly known as:  LIPITOR  Take 1 tablet (20 mg total) by mouth daily.     insulin aspart 100 UNIT/ML FlexPen  Commonly known as:  NOVOLOG FLEXPEN  Inject 20 Units  into the skin 3 (three) times daily with meals.     Insulin Glargine 100 UNIT/ML Solostar Pen  Commonly known as:  LANTUS SOLOSTAR  Inject 32 Units into the skin daily.     insulin lispro 100 UNIT/ML KiwkPen  Commonly known as:  HUMALOG  Inject 20 units 3 times daily with meals     insulin NPH Human 100 UNIT/ML injection  Commonly known as:  HUMULIN N  Inject 16 units before breakfast and 16 units at bedtime     insulin regular 100 units/mL injection  Commonly known as:  HUMULIN R  Inject 0.2 mLs (20 Units total) into the skin 3 (three) times daily before meals.     ONE TOUCH ULTRA TEST test strip  Generic drug:  glucose blood  1 each by Other route as needed for other. Use as instructed        Allergies:  Allergies  Allergen Reactions  . Penicillins Itching    Past Medical History  Diagnosis Date  . Diabetes mellitus without complication     History reviewed. No pertinent past surgical history.  History reviewed. No pertinent family  history.  Social History:  reports that she has never smoked. She has never used smokeless tobacco. She reports that she does not drink alcohol or use illicit drugs.    Review of Systems:   She has a prior history of hyperthyroidism treated with Tapazole in 2001, Continues to have slightly low TSH without recurrence of hyperthyroidism. No unusual fatigue, only mild cord intolerance   Lab Results  Component Value Date   TSH 0.22* 09/11/2013    She has had significant hyperlipidemia including high triglycerides, was not taking Lipitor because of cost on the last visit when LDL was 194 and she thinks she is taking it at night daily  Lab Results  Component Value Date   CHOL 320* 09/11/2013   HDL 35.50* 09/11/2013   LDLDIRECT 194.1 12/02/2013   TRIG 286.0* 09/11/2013   CHOLHDL 9 09/11/2013     She has had hair loss. No increased facial hair. She is not having any menstrual cycles at present   Physical Examination:  BP 124/82  Pulse 93  Temp(Src) 97.9 F (36.6 C) (Oral)  Resp 12  Ht 4' 10.75" (1.492 m)  Wt 119 lb (53.978 kg)  BMI 24.25 kg/m2  SpO2 97%      No edema  ASSESSMENT/PLAN:   Diabetes type 1:   Blood sugars are  Worse with switching to NPH and regular insulin and appears to be getting much less basal insulin with current regimen of 16 units twice a day Was switched from a baseline Lantus dose of 32 units  Appears that she will need at least a significantly higher dose of NPH at bedtime Some of her postprandial readings are fairly good since her appetite is somewhat variable  Also for are better overall control she may be a candidate for the V.-go pump and she needs to look into the insurance coverage for this   Recommendations made today as follows (translation  printed in Spanish also):   Given her literature and contact information for the V.-go pump   Increase  NPH to 20 units in the morning and 24 at bedtime  May go up on her  regular insulin by 4 units  if  eating larger meals or more carbohydrates  More consistent glucose monitoring before or 2 hours after meals   followup in one month with A1c  HYPERCHOLESTEROLEMIA:  Will check LDL on the next visit . She states she is taking  her Lipitor  Have also advised her to establish with a PCP for various other problems   Reather Littlerjay Benedicta Sultan 03/01/2014, 8:17 AM

## 2014-03-02 ENCOUNTER — Ambulatory Visit: Payer: BC Managed Care – PPO | Admitting: Endocrinology

## 2014-03-04 ENCOUNTER — Other Ambulatory Visit: Payer: Self-pay | Admitting: *Deleted

## 2014-03-04 MED ORDER — ATORVASTATIN CALCIUM 20 MG PO TABS: 20.0000 mg | ORAL_TABLET | Freq: Every day | ORAL | Status: DC

## 2014-03-25 ENCOUNTER — Other Ambulatory Visit (INDEPENDENT_AMBULATORY_CARE_PROVIDER_SITE_OTHER): Payer: BC Managed Care – PPO

## 2014-03-25 DIAGNOSIS — E785 Hyperlipidemia, unspecified: Secondary | ICD-10-CM

## 2014-03-25 DIAGNOSIS — IMO0002 Reserved for concepts with insufficient information to code with codable children: Secondary | ICD-10-CM

## 2014-03-25 DIAGNOSIS — E1065 Type 1 diabetes mellitus with hyperglycemia: Secondary | ICD-10-CM

## 2014-03-25 LAB — COMPREHENSIVE METABOLIC PANEL
ALK PHOS: 78 U/L (ref 39–117)
ALT: 17 U/L (ref 0–35)
AST: 18 U/L (ref 0–37)
Albumin: 3.9 g/dL (ref 3.5–5.2)
BUN: 17 mg/dL (ref 6–23)
CALCIUM: 9.5 mg/dL (ref 8.4–10.5)
CO2: 26 mEq/L (ref 19–32)
Chloride: 105 mEq/L (ref 96–112)
Creatinine, Ser: 0.8 mg/dL (ref 0.4–1.2)
GFR: 87.76 mL/min (ref >60.00)
GLUCOSE: 357 mg/dL — AB (ref 70–99)
POTASSIUM: 4.3 meq/L (ref 3.5–5.1)
Sodium: 138 mEq/L (ref 135–145)
Total Bilirubin: 0.4 mg/dL (ref 0.2–1.2)
Total Protein: 7.2 g/dL (ref 6.0–8.3)

## 2014-03-25 LAB — LDL CHOLESTEROL, DIRECT: LDL DIRECT: 107.7 mg/dL

## 2014-03-31 LAB — FRUCTOSAMINE: FRUCTOSAMINE: 383 umol/L — AB (ref 190–270)

## 2014-04-01 ENCOUNTER — Ambulatory Visit (INDEPENDENT_AMBULATORY_CARE_PROVIDER_SITE_OTHER): Payer: BC Managed Care – PPO | Admitting: Endocrinology

## 2014-04-01 ENCOUNTER — Encounter: Payer: Self-pay | Admitting: Endocrinology

## 2014-04-01 VITALS — BP 138/84 | HR 100 | Temp 97.7°F | Resp 14 | Ht 58.75 in | Wt 120.0 lb

## 2014-04-01 DIAGNOSIS — E1065 Type 1 diabetes mellitus with hyperglycemia: Secondary | ICD-10-CM

## 2014-04-01 DIAGNOSIS — E785 Hyperlipidemia, unspecified: Secondary | ICD-10-CM

## 2014-04-01 DIAGNOSIS — IMO0002 Reserved for concepts with insufficient information to code with codable children: Secondary | ICD-10-CM

## 2014-04-01 NOTE — Progress Notes (Signed)
Patient ID: Gabrielle Waller, female   DOB: [birth year], 48 y.o.   MRN: [id number]   Reason for Appointment : Follow up for Type 1 Diabetes  History of Present Illness          Diagnosis: Type 1 diabetes mellitus, date of diagnosis: 2001         Past history: Her blood sugar at diagnosis was 1056 and was started on insulin She is typically having difficulty controlling her diabetes because of cost of her medications and compliance She is also having relatively labile blood sugars Has difficulty understanding instructions for day-to-day management for diabetes and although she claims to be compliant with her insulin doses as prescribed she does not often check her blood sugars as directed and not clear how her diet affects her blood sugars   INSULIN regimen is described as: NPH 20 -24 , regular insulin 20 units before meals  Recent history: She has not had good control with using NPH and regular insulin since about 3/15  She had wanted a less expensive medication compared to Lantus and Humalog Again her blood sugars are higher in the morning for unknown reasons even though she thinks she is very compliant with taking her bedtime NPH given in blood sugars are fairly good at bedtime Also rarely may have low sugars at 2 AM which he does not document. Checking blood sugars somewhat erratically in the evenings and mostly around bedtime and these are somewhat variable She was told to look into the V.-go pump but she did not contact the insurance or the company Recent A1c is high Hypoglycemia:  none recently  Glucose monitoring:  done 1.4   times a day         Glucometer: One Touch ultra 2.      Blood Glucose readings from meter download:   PREMEAL Breakfast Lunch Dinner  8-11 PM  Overall  Glucose range:  199-414  ?   111   77-305    Mean/median:  295     170   274    Self-care:  Meals: 3 meals per day. Egg, bread at 11 am; lunch usually skipped and dinner 6 p.m.           Physical activity:  exercise: She is trying to walk daily this does not cause low sugars.          Dietician visit: Most recent:. ?         Wt Readings from Last 3 Encounters:  04/01/14 120 lb (54.432 kg)  03/01/14 119 lb (53.978 kg)  12/02/13 120 lb (54.432 kg)   Retinal exam: Most recent: ?   Lab Results  Component Value Date   HGBA1C 9.3* 03/01/2014    Lab Results  Component Value Date   MICROALBUR 4.0* 07/14/2013      Medication List       This list is accurate as of: 04/01/14  8:41 AM.  Always use your most recent med list.               amoxicillin-clavulanate 875-125 MG per tablet  Commonly known as:  AUGMENTIN  Take 1 tablet by mouth 2 (two) times daily.     atorvastatin 20 MG tablet  Commonly known as:  LIPITOR  Take 1 tablet (20 mg total) by mouth daily.     insulin aspart 100 UNIT/ML FlexPen  Commonly known as:  NOVOLOG FLEXPEN  Inject 20 Units into the skin 3 (three) times daily with meals.  Insulin Glargine 100 UNIT/ML Solostar Pen  Commonly known as:  LANTUS SOLOSTAR  Inject 32 Units into the skin daily.     insulin lispro 100 UNIT/ML KiwkPen  Commonly known as:  HUMALOG  Inject 20 units 3 times daily with meals     insulin NPH Human 100 UNIT/ML injection  Commonly known as:  HUMULIN N  Inject 16 units before breakfast and 16 units at bedtime     insulin regular 100 units/mL injection  Commonly known as:  HUMULIN R  Inject 0.2 mLs (20 Units total) into the skin 3 (three) times daily before meals.     ONE TOUCH ULTRA TEST test strip  Generic drug:  glucose blood  1 each by Other route as needed for other. Use as instructed        Allergies:  Allergies  Allergen Reactions  . Penicillins Itching    Past Medical History  Diagnosis Date  . Diabetes mellitus without complication     No past surgical history on file.  No family history on file.  Social History:  reports that she has never smoked. She has never used smokeless tobacco. She reports that  she does not drink alcohol or use illicit drugs.    Review of Systems:   She has a prior history of hyperthyroidism treated with Tapazole in 2001, Continues to have slightly low TSH and low normal free T4 without recurrence of hyperthyroidism. No unusual fatigue  Lab Results  Component Value Date   FREET4 0.72 03/01/2014   FREET4 0.67 09/11/2013   TSH 0.27* 03/01/2014   TSH 0.22* 09/11/2013     She has had significant hyperlipidemia including high triglycerides, was not taking Lipitor because of cost on the last visit when LDL was 194 and she is taking it at night daily recently  Lab Results  Component Value Date   CHOL 320* 09/11/2013   HDL 35.50* 09/11/2013   LDLDIRECT 107.7 03/25/2014   TRIG 286.0* 09/11/2013   CHOLHDL 9 09/11/2013     She has had hair loss. No increased facial hair. Testosterone and DHEAS normal She is not having any menstrual cycles at present   Physical Examination:  BP 138/84  Pulse 100  Temp(Src) 97.7 F (36.5 C)  Resp 14  Ht 4' 10.75" (1.492 m)  Wt 120 lb (54.432 kg)  BMI 24.45 kg/m2  SpO2 94%      No edema  ASSESSMENT/PLAN:   Diabetes type 1:   Blood sugars are  still poor with regimen of NPH and regular insulin and appears to be getting much less basal insulin with current regimen  Was switched from a baseline Lantus dose of 32 units   Appears that she will need a higher dose of NPH at bedtime although not clear why she sometimes has a low sugar at 2 AM Some of her postprandial readings are fairly good but not checking after her lunch Again discussed that for better overall control she may be a candidate for the V.-go pump and she needs to look into the insurance coverage for this. Discussed again how this would benefit her and how it works.  Recommendations made today as follows (translation  printed in Spanish also):   Advised her to contact insurance/fracture for the V.-go pump   Change NPH to 16 units in the morning and 28 at  bedtime  May go up on her  regular insulin by 4 units  if eating larger meals or more carbohydrates  More consistent  glucose monitoring before or 2 hours after meals   followup in 2 months  Will start her V.-go pump if it is available sooner  HYPERCHOLESTEROLEMIA: LDL is better with taking  her Lipitor regularly  Counseling time over 50% of today's 25 minute visit  Reather LittlerAjay Janayla Marik 04/01/2014, 8:41 AM

## 2014-04-01 NOTE — Patient Instructions (Addendum)
N insulin 28 at bedtime and 16 units at 9 am daily Regular insulin 20 units before meals  Have a snack at bedtime with some protein   More sugars after noon meal and at 7-8 pm

## 2014-04-07 ENCOUNTER — Telehealth: Payer: Self-pay | Admitting: *Deleted

## 2014-04-07 ENCOUNTER — Other Ambulatory Visit: Payer: Self-pay | Admitting: Endocrinology

## 2014-04-07 NOTE — Telephone Encounter (Signed)
She need medication insulin novolog

## 2014-04-08 ENCOUNTER — Other Ambulatory Visit: Payer: Self-pay | Admitting: *Deleted

## 2014-04-08 MED ORDER — INSULIN ASPART 100 UNIT/ML FLEXPEN: 20.0000 [IU] | PEN_INJECTOR | Freq: Three times a day (TID) | SUBCUTANEOUS | Status: DC

## 2014-04-08 NOTE — Telephone Encounter (Signed)
rx sent

## 2014-04-08 NOTE — Telephone Encounter (Signed)
Pt needs the novolog called into the [facility] [phone]

## 2014-04-29 ENCOUNTER — Other Ambulatory Visit: Payer: Self-pay | Admitting: Endocrinology

## 2014-05-10 ENCOUNTER — Other Ambulatory Visit: Payer: BC Managed Care – PPO

## 2014-05-13 ENCOUNTER — Ambulatory Visit: Payer: BC Managed Care – PPO | Admitting: Endocrinology

## 2014-06-03 ENCOUNTER — Telehealth: Payer: Self-pay

## 2014-06-03 NOTE — Telephone Encounter (Signed)
Diabetic Bundle. Lvom for pt to call back and schedule follow up with Dr. Lucianne MussKumar.

## 2014-06-24 ENCOUNTER — Other Ambulatory Visit: Payer: BC Managed Care – PPO

## 2014-06-28 ENCOUNTER — Ambulatory Visit (INDEPENDENT_AMBULATORY_CARE_PROVIDER_SITE_OTHER): Payer: BC Managed Care – PPO | Admitting: Endocrinology

## 2014-06-28 ENCOUNTER — Encounter: Payer: Self-pay | Admitting: Endocrinology

## 2014-06-28 VITALS — BP 124/78 | HR 94 | Temp 98.1°F | Resp 14 | Ht 58.75 in | Wt 119.0 lb

## 2014-06-28 DIAGNOSIS — E785 Hyperlipidemia, unspecified: Secondary | ICD-10-CM

## 2014-06-28 DIAGNOSIS — E1065 Type 1 diabetes mellitus with hyperglycemia: Secondary | ICD-10-CM

## 2014-06-28 DIAGNOSIS — IMO0002 Reserved for concepts with insufficient information to code with codable children: Secondary | ICD-10-CM

## 2014-06-28 NOTE — Progress Notes (Signed)
Patient ID: Gabrielle Waller, female   DOB: 08-May-1966, 48 y.o.   MRN: [id number]   Reason for Appointment : Follow up for Type 1 Diabetes  History of Present Illness          Diagnosis: Type 1 diabetes mellitus, date of diagnosis: 2001         Past history: Her blood sugar at diagnosis was 1056 and was started on insulin She is typically having difficulty controlling her diabetes because of cost of her medications and compliance She is also having relatively labile blood sugars Has difficulty understanding instructions for day-to-day management for diabetes and although she claims to be compliant with her insulin doses as prescribed she does not often check her blood sugars as directed and not clear how her diet affects her blood sugars   INSULIN regimen is described as: NPH 20 units a.m. -24 bedtime , regular insulin 20 units before meals n  Recent history:  She still has not had good control with using NPH and regular insulin which she had started in 3/15 instead of Lantus and Humalog because of the cost Her blood sugar patterns are about the same as on the last visit frequently very high readings before her first meal and relatively better readings at bedtime Blood sugar may be over 300 and rarely over 500 before her first meal which can be between 9 AM-12 noon She thinks she is compliant with all her insulin doses as prescribed although has some trouble remembering the exact doses She is checking blood sugars again very sporadically and frequently not after her meals during the day Overall control has been persistently poor She does not think she can afford the V.-go pump   A1c is pending  Hypoglycemia: Once at 3 AM  Glucose monitoring:  done 1.4   times a day         Glucometer: One Touch ultra 2.      Blood Glucose readings from meter download:   PREMEAL Breakfast Lunch Dinner Bedtime Overall  Glucose range:  157-532       Mean/median:        POST-MEAL PC Breakfast PC Lunch PC  Dinner  Glucose range:     Mean/median:        PREMEAL Breakfast Lunch Dinner  8-11 PM  Overall  Glucose range:  199-414  ?   111   77-305    Mean/median:  295     170   274    Self-care:  Meals: 3 meals per day. Egg, bread at 11 am; lunch sometimes skipped and dinner 6 p.m.           Physical activity: exercise: She is trying to walk fairly regularly and this does not cause low sugars.          Dietician visit: Most recent:. ?         Wt Readings from Last 3 Encounters:  06/28/14 119 lb (53.978 kg)  04/01/14 120 lb (54.432 kg)  03/01/14 119 lb (53.978 kg)   Retinal exam: Most recent: ?   Lab Results  Component Value Date   HGBA1C 9.3* 03/01/2014    Lab Results  Component Value Date   MICROALBUR 4.0* 07/14/2013      Medication List       This list is accurate as of: 06/28/14  3:45 PM.  Always use your most recent med list.               atorvastatin 20  MG tablet  Commonly known as:  LIPITOR  Take 1 tablet (20 mg total) by mouth daily.     HUMULIN N 100 UNIT/ML injection  Generic drug:  insulin NPH Human  INJECT SIXTEEN UNITS SUBCUTANEOUSLY BEFORE BREAKFAST AND AT BEDTIME     HUMULIN R 100 units/mL injection  Generic drug:  insulin regular  INJECT TWENTY UNITS THREE TIMES DAILY BEFORE MEAL(S)     insulin lispro 100 UNIT/ML KiwkPen  Commonly known as:  HUMALOG  Inject 20 units 3 times daily with meals     ONE TOUCH ULTRA TEST test strip  Generic drug:  glucose blood  1 each by Other route as needed for other. Use as instructed        Allergies:  Allergies  Allergen Reactions  . Penicillins Itching    Past Medical History  Diagnosis Date  . Diabetes mellitus without complication     No past surgical history on file.  No family history on file.  Social History:  reports that she has never smoked. She has never used smokeless tobacco. She reports that she does not drink alcohol or use illicit drugs.    Review of Systems:   She has a prior  history of hyperthyroidism treated with Tapazole in 2001, Continues to have slightly low TSH and low normal free T4 without recurrence of hyperthyroidism. No unusual fatigue  Lab Results  Component Value Date   FREET4 0.72 03/01/2014   FREET4 0.67 09/11/2013   TSH 0.27* 03/01/2014   TSH 0.22* 09/11/2013     She has had significant hyperlipidemia including high triglycerides, was not taking Lipitor because of cost on the last visit when LDL was 194 and she is taking it at night daily recently  Lab Results  Component Value Date   CHOL 320* 09/11/2013   HDL 35.50* 09/11/2013   LDLDIRECT 107.7 03/25/2014   TRIG 286.0* 09/11/2013   CHOLHDL 9 09/11/2013     She has had hair loss.  Testosterone and DHEAS normal She is not having any menstrual cycles at present   Physical Examination:  BP 124/78  Pulse 94  Temp(Src) 98.1 F (36.7 C)  Resp 14  Ht 4' 10.75" (1.492 m)  Wt 119 lb (53.978 kg)  BMI 24.25 kg/m2  SpO2 96%       Exam not indicated  ASSESSMENT/PLAN:   Diabetes type 1:   Blood sugars are  still poor with regimen of NPH and regular insulin  Her NPH is peaking early during the night and not controlling fasting hyperglycemia  Will increase her morning NPH and reduce her bedtime slightly Most likely will need a little less insulin at suppertime also She is a better candidate for an insulin pump or other kind of basal insulin  Discussed that her control will be suboptimal with current regimen  She also is somewhat noncompliant with her glucose monitoring and not checking after breakfast and lunch Discussed again how V- go pump works and how this would benefit her   Recommendations made today as follows (translation done by interpreter)  Advised her to look into the V.-go pump with the help of the co-pay card  No change in insulin as yet  NPH 24 units in am and 22 at bedtime  Regular insulin 20 units before Breakfast and lunch and 18 before dinner  Contact nurse educator  for a trial of the pump, most likely can use 30 units basal and she can try 14-16 units for her boluses based  on meal size  Check fructosamine and LDL Counseling time over 50% of today's 25 minute visit    Valisa Karpel 06/28/2014, 3:45 PM    Addendum: LDL mildly increased, glucose significantly high as well as fructosamine  No visits with results within 1 Week(s) from this visit. Latest known visit with results is:  Appointment on 03/25/2014  Component Date Value Ref Range Status  . Direct LDL 03/25/2014 107.7   Final   Optimal:  <100 mg/dLNear or Above Optimal:  100-129 mg/dLBorderline High:  130-159 mg/dLHigh:  160-189 mg/dLVery High:  >190 mg/dL  . Sodium 03/25/2014 138  135 - 145 mEq/L Final  . Potassium 03/25/2014 4.3  3.5 - 5.1 mEq/L Final  . Chloride 03/25/2014 105  96 - 112 mEq/L Final  . CO2 03/25/2014 26  19 - 32 mEq/L Final  . Glucose, Bld 03/25/2014 357* 70 - 99 mg/dL Final  . BUN 16/08/9603 17  6 - 23 mg/dL Final  . Creatinine, Ser 03/25/2014 0.8  0.4 - 1.2 mg/dL Final  . Total Bilirubin 03/25/2014 0.4  0.2 - 1.2 mg/dL Final  . Alkaline Phosphatase 03/25/2014 78  39 - 117 U/L Final  . AST 03/25/2014 18  0 - 37 U/L Final  . ALT 03/25/2014 17  0 - 35 U/L Final  . Total Protein 03/25/2014 7.2  6.0 - 8.3 g/dL Final  . Albumin 54/07/8118 3.9  3.5 - 5.2 g/dL Final  . Calcium 14/78/2956 9.5  8.4 - 10.5 mg/dL Final  . GFR 21/30/8657 87.76  >60.00 mL/min Final  . Fructosamine 03/25/2014 383* 190 - 270 umol/L Final

## 2014-06-28 NOTE — Patient Instructions (Signed)
NPH 24 units in am and 22 at bedtime  Regular insulin 20 units before Breakfast and lunch and 18 before dinner  Get V-go pump and have Bonita Quin start it

## 2014-06-30 ENCOUNTER — Encounter: Payer: BC Managed Care – PPO | Admitting: Nutrition

## 2014-07-09 ENCOUNTER — Other Ambulatory Visit: Payer: Self-pay | Admitting: Endocrinology

## 2014-07-28 ENCOUNTER — Encounter: Payer: Self-pay | Admitting: Endocrinology

## 2014-07-28 ENCOUNTER — Ambulatory Visit (INDEPENDENT_AMBULATORY_CARE_PROVIDER_SITE_OTHER): Payer: BC Managed Care – PPO | Admitting: Endocrinology

## 2014-07-28 VITALS — BP 128/82 | HR 110 | Temp 98.6°F | Resp 12 | Wt 116.0 lb

## 2014-07-28 DIAGNOSIS — E1065 Type 1 diabetes mellitus with hyperglycemia: Secondary | ICD-10-CM

## 2014-07-28 DIAGNOSIS — E785 Hyperlipidemia, unspecified: Secondary | ICD-10-CM

## 2014-07-28 DIAGNOSIS — IMO0002 Reserved for concepts with insufficient information to code with codable children: Secondary | ICD-10-CM

## 2014-07-28 NOTE — Progress Notes (Signed)
Patient ID: Gabrielle Waller, female   DOB: Mar 20, 1966, 48 y.o.   MRN: [id number]   Reason for Appointment : Follow up for Type 1 Diabetes  History of Present Illness          Diagnosis: Type 1 diabetes mellitus, date of diagnosis: 2001         Past history: Her blood sugar at diagnosis was 1056 and was started on insulin She is typically having difficulty controlling her diabetes because of cost of her medications and compliance She is also having relatively labile blood sugars Has difficulty understanding instructions for day-to-day management for diabetes and although she claims to be compliant with her insulin doses as prescribed she does not often check her blood sugars as directed and not clear how her diet affects her blood sugars   INSULIN regimen is described as: NPH 20 units a.m. -24 at 11pm , regular insulin 20 units before meals   Recent history:  She was advised to start the V go pump but apparently this was too expensive and did not do so She still is using twice a day NPH and pre-meal regular insulin which she had started in 3/15 instead of Lantus and Humalog because of the cost Her blood sugar patterns are about the same as on the last visit:  Not clear which readings in the mornings are fasting and sometimes she does not eat until 12 noon but she thinks most of them are before any food. Has only one good reading in the morning otherwise they are significantly high  Checking blood sugars after breakfast and lunch very rarely but appears to have relatively higher readings before supper  Blood sugars after supper are relatively higher but not consistent, recently relatively higher  She got hypoglycemic after her evening walk which is about 2 hours after dinner and after drinking Coke she had a rebound up to 382. With taking extra 15 units of regular insulin she got hypoglycemic at 3 AM  No other hypoglycemia   frequently very high readings before her first meal and relatively  better readings at bedtime  A1c is pending but previously 9.3  Hypoglycemia: Once at 3 AM  Glucose monitoring:  done 1.4   times a day         Glucometer: One Touch ultra 2.      Blood Glucose readings from meter download:   PREMEAL Breakfast Lunch Dinner Bedtime Overall  Glucose range:  131-446  ?   184, 257   64-254    Mean/median:      184    Self-care:  Meals: 2-3 meals per day. Egg, bread at 10 am; lunch sometimes skipped otherwise 1 PM and dinner 5- 6 p.m.           Physical activity: exercise: She is trying to walk fairly regularly      Dietician visit: Most recent:. ?         Wt Readings from Last 3 Encounters:  07/28/14 116 lb (52.617 kg)  06/28/14 119 lb (53.978 kg)  04/01/14 120 lb (54.432 kg)   Retinal exam: Most recent: ?   Lab Results  Component Value Date   HGBA1C 9.3* 03/01/2014    Lab Results  Component Value Date   MICROALBUR 4.0* 07/14/2013      Medication List       This list is accurate as of: 07/28/14  4:43 PM.  Always use your most recent med list.  atorvastatin 20 MG tablet  Commonly known as:  LIPITOR  TAKE ONE TABLET BY MOUTH ONCE DAILY     HUMULIN N 100 UNIT/ML injection  Generic drug:  insulin NPH Human  INJECT SIXTEEN UNITS SUBCUTANEOUSLY BEFORE BREAKFAST AND AT BEDTIME     HUMULIN R 100 units/mL injection  Generic drug:  insulin regular  INJECT TWENTY UNITS THREE TIMES DAILY BEFORE MEAL(S)     insulin lispro 100 UNIT/ML KiwkPen  Commonly known as:  HUMALOG  Inject 20 units 3 times daily with meals     ONE TOUCH ULTRA TEST test strip  Generic drug:  glucose blood  1 each by Other route as needed for other. Use as instructed        Allergies:  Allergies  Allergen Reactions  . Penicillins Itching    Past Medical History  Diagnosis Date  . Diabetes mellitus without complication     No past surgical history on file.  No family history on file.  Social History:  reports that she has never smoked. She  has never used smokeless tobacco. She reports that she does not drink alcohol or use illicit drugs.    Review of Systems:   She has a prior history of hyperthyroidism treated with Tapazole in 2001, Continues to have slightly low TSH and low normal free T4 without recurrence of hyperthyroidism. No unusual fatigue  Lab Results  Component Value Date   FREET4 0.72 03/01/2014   FREET4 0.67 09/11/2013   TSH 0.27* 03/01/2014   TSH 0.22* 09/11/2013     She has had significant hyperlipidemia including high triglycerides. she is taking Lipitor at night daily   Lab Results  Component Value Date   CHOL 320* 09/11/2013   HDL 35.50* 09/11/2013   LDLDIRECT 107.7 03/25/2014   TRIG 286.0* 09/11/2013   CHOLHDL 9 09/11/2013     She has had hair loss.  Testosterone and DHEAS normal She is not having any menstrual cycles at present   Physical Examination:  BP 128/82  Pulse 110  Temp(Src) 98.6 F (37 C) (Oral)  Resp 12  Wt 116 lb (52.617 kg)  SpO2 97%       Exam not indicated  ASSESSMENT/PLAN:   Diabetes type 1:   Blood sugars are  still poor with regimen of twice a day NPH and regular insulin  Difficult to assess her pattern that she is mostly checking blood sugars late in the evening See history of present illness for detailed discussion of her current management, blood sugar patterns, diet and problems identified Although she is taking her insulin doses as directed she is having variable readings in the evenings She also is somewhat noncompliant with her glucose monitoring and discussed checking blood sugars at various times Discussed again to check on the V- go pump through her insurance She will also need to make changes in her insulin regimen and start adjusting evening dose of regular insulin based on her meal size and activity Will need to increase her bedtime NPH since fasting readings are mostly high May need more diabetes education through educator  Counseling time over 50% of  today's 25 minute visit  Patient Instructions  No rapid insulin at bedtime  Reduce rapid insulin to 16 at dinner if eating less or going for a walk  Check more sugars after breakfast  Increase cloudy to 28 at night and reduce am to 22  More sugars after Breakfast    Sin la insulina rpida antes de acostarse  Reducir  la insulina rpida a las 16 en la cena si comer menos o ir a dar un paseo  Vea ms azcares despus del desayuno  Aumentar nublado con 28 en la noche y reducir la maana a 22  Ms azcares despus del desayuno       Braelynn Benning 07/28/2014, 4:43 PM

## 2014-07-28 NOTE — Patient Instructions (Addendum)
No rapid insulin at bedtime  Reduce rapid insulin to 16 at dinner if eating less or going for a walk  Check more sugars after breakfast  Increase cloudy to 28 at night and reduce am to 22  More sugars after Breakfast

## 2014-09-03 ENCOUNTER — Other Ambulatory Visit: Payer: BC Managed Care – PPO

## 2014-09-08 ENCOUNTER — Ambulatory Visit: Payer: BC Managed Care – PPO | Admitting: Endocrinology

## 2014-09-12 ENCOUNTER — Other Ambulatory Visit: Payer: Self-pay | Admitting: Endocrinology

## 2014-09-20 ENCOUNTER — Other Ambulatory Visit (INDEPENDENT_AMBULATORY_CARE_PROVIDER_SITE_OTHER): Payer: BC Managed Care – PPO

## 2014-09-20 DIAGNOSIS — E785 Hyperlipidemia, unspecified: Secondary | ICD-10-CM

## 2014-09-20 DIAGNOSIS — E1065 Type 1 diabetes mellitus with hyperglycemia: Secondary | ICD-10-CM

## 2014-09-20 DIAGNOSIS — IMO0002 Reserved for concepts with insufficient information to code with codable children: Secondary | ICD-10-CM

## 2014-09-20 LAB — LIPID PANEL
CHOL/HDL RATIO: 6
CHOLESTEROL: 187 mg/dL (ref 0–200)
HDL: 33 mg/dL — AB (ref >39.00)
NonHDL: 154
TRIGLYCERIDES: 216 mg/dL — AB (ref 0.0–149.0)
VLDL: 43.2 mg/dL — AB (ref 0.0–40.0)

## 2014-09-20 LAB — MICROALBUMIN / CREATININE URINE RATIO
Creatinine,U: 48.1 mg/dL
Microalb Creat Ratio: 1.9 mg/g (ref 0.0–30.0)
Microalb, Ur: 0.9 mg/dL (ref 0.0–1.9)

## 2014-09-20 LAB — HEMOGLOBIN A1C: HEMOGLOBIN A1C: 8.9 % — AB (ref 4.6–6.5)

## 2014-09-20 LAB — BASIC METABOLIC PANEL
BUN: 17 mg/dL (ref 6–23)
CHLORIDE: 106 meq/L (ref 96–112)
CO2: 22 mEq/L (ref 19–32)
Calcium: 9.7 mg/dL (ref 8.4–10.5)
Creatinine, Ser: 0.6 mg/dL (ref 0.4–1.2)
GFR: 120.21 mL/min (ref >60.00)
Glucose, Bld: 333 mg/dL — ABNORMAL HIGH (ref 70–99)
POTASSIUM: 4.3 meq/L (ref 3.5–5.1)
SODIUM: 141 meq/L (ref 135–145)

## 2014-09-21 LAB — LDL CHOLESTEROL, DIRECT: Direct LDL: 119.9 mg/dL

## 2014-09-23 ENCOUNTER — Encounter: Payer: Self-pay | Admitting: Endocrinology

## 2014-09-23 ENCOUNTER — Ambulatory Visit (INDEPENDENT_AMBULATORY_CARE_PROVIDER_SITE_OTHER): Payer: BC Managed Care – PPO | Admitting: Endocrinology

## 2014-09-23 VITALS — BP 128/78 | HR 104 | Temp 97.6°F | Resp 14 | Ht <= 58 in | Wt 116.0 lb

## 2014-09-23 DIAGNOSIS — E1165 Type 2 diabetes mellitus with hyperglycemia: Secondary | ICD-10-CM

## 2014-09-23 DIAGNOSIS — IMO0002 Reserved for concepts with insufficient information to code with codable children: Secondary | ICD-10-CM

## 2014-09-23 DIAGNOSIS — E782 Mixed hyperlipidemia: Secondary | ICD-10-CM

## 2014-09-23 MED ORDER — ATORVASTATIN CALCIUM 40 MG PO TABS: 40.0000 mg | ORAL_TABLET | Freq: Every day | ORAL | Status: DC

## 2014-09-23 MED ORDER — INSULIN GLARGINE 300 UNIT/ML ~~LOC~~ SOPN: 50.0000 [IU] | PEN_INJECTOR | Freq: Every day | SUBCUTANEOUS | Status: DC

## 2014-09-23 NOTE — Patient Instructions (Addendum)
If starting Toujeo take 50 units on waking up once a day and stop Humulin N insulin  Regular insulin 15-30 min before every meal, same dose  Si a partir Toujeo tomar 50 unidades al despertar una vez al da y dejar de Humulina N insulina  Regular la insulina 15-30 minutos antes de cada comida, misma dosis  Please check blood sugars at least half the time about 2 hours after any meal and times per week on waking up. Please bring blood sugar diary to each visit  Increase Atorvastatin to 40 mg

## 2014-09-23 NOTE — Progress Notes (Signed)
Patient ID: Gabrielle Waller, female   DOB: 28-May-1966, 48 y.o.   MRN: [id number]   Reason for Appointment : Follow up for Type 1 Diabetes  History of Present Illness          Diagnosis: Type 1 diabetes mellitus, date of diagnosis: 2001         Past history: Her blood sugar at diagnosis was 1056 and was started on insulin She is typically having difficulty controlling her diabetes because of cost of her medications and compliance She is also having relatively labile blood sugars Has difficulty understanding instructions for day-to-day management for diabetes and although she claims to be compliant with her insulin doses as prescribed she does not often check her blood sugars as directed and not clear how her diet affects her blood sugars   INSULIN regimen is described as: NPH 28 units a.m. -22 at 11pm , regular insulin 16-25 units before meals   Recent history:  She still is using twice a day NPH and pre-meal regular insulin which she had started in 3/15 instead of Lantus and Humalog   Difficult to get consistent history from her even through interpreter  Her blood sugar patterns are   Difficult to identify since she is  Not using One Touch monitor and using the [employer] brand  Since last month and not keeping a diary.  Also her monitor may have incorrect date  Programmed as some readings are  From August and some from October  current patterns:   fasting blood sugars appear to be mostly high   she has sporadic high readings in the afternoons and especially if she is not eating supper till 7 PM   Again not clear which readings our before our which are after eating   Bedtime readings appear to be variable  But has only 2 recent readings   only one episode of mild hypoglycemia in the afternoon  No further episodes of nocturnal hypoglycemia since she was told not to take any regular insulin late at night Mealtime insulin: She states that she can take this up to 3 hours after eating and does not  have any fixed time to take her regular insulin She was advised to start the V go pump but apparently this was too expensive and  Does not want to do this  A1c is still nearly 9%  Glucose monitoring:  done about once a day         Glucometer: [employer] brand     Blood Glucose readings from meter download:  Am 219  308 acs 83, 172, 320, 349 3 pm 54 Hs 133, 223  Self-care:  Meals: 2-3 meals per day. Egg, bread at 10 am; lunch sometimes skipped otherwise 1 PM and dinner 6-7 p.m.           Physical activity: exercise: She is trying to walk fairly often      Dietician visit: Most recent:. ?     She has seen diabetes educator several times      Wt Readings from Last 3 Encounters:  09/23/14 116 lb (52.617 kg)  07/28/14 116 lb (52.617 kg)  06/28/14 119 lb (53.978 kg)   Retinal exam: Most recent: ?   Lab Results  Component Value Date   HGBA1C 8.9* 09/20/2014   HGBA1C 9.3* 03/01/2014   HGBA1C 8.7* 12/02/2013   Lab Results  Component Value Date   MICROALBUR 0.9 09/20/2014   CREATININE 0.6 09/20/2014       Medication List  This list is accurate as of: 09/23/14  9:15 PM.  Always use your most recent med list.               atorvastatin 20 MG tablet  Commonly known as:  LIPITOR  TAKE ONE TABLET BY MOUTH ONCE DAILY     HUMULIN N 100 UNIT/ML injection  Generic drug:  insulin NPH Human  INJECT SIXTEEN UNITS SUBCUTANEOUSLY BEFORE BREAKFAST AND AT BEDTIME     Insulin Glargine 300 UNIT/ML Sopn  Commonly known as:  TOUJEO SOLOSTAR  Inject 50 Units into the skin daily.     insulin lispro 100 UNIT/ML KiwkPen  Commonly known as:  HUMALOG  Inject 20 units 3 times daily with meals     NOVOLIN R RELION 100 units/mL injection  Generic drug:  insulin regular  INJECT 20 UNITS SUBCUTANEOUSLY THREE TIMES DAILY BEFORE MEALS     ONE TOUCH ULTRA TEST test strip  Generic drug:  glucose blood  1 each by Other route as needed for other. Use as instructed        Allergies:   Allergies  Allergen Reactions  . Penicillins Itching    Past Medical History  Diagnosis Date  . Diabetes mellitus without complication     No past surgical history on file.  No family history on file.  Social History:  reports that she has never smoked. She has never used smokeless tobacco. She reports that she does not drink alcohol or use illicit drugs.    Review of Systems:   She has a prior history of hyperthyroidism treated with Tapazole in 2001, Continues to have slightly low TSH and low normal free T4 without recurrence of hyperthyroidism. No unusual fatigue  Lab Results  Component Value Date   FREET4 0.72 03/01/2014   FREET4 0.67 09/11/2013   TSH 0.27* 03/01/2014   TSH 0.22* 09/11/2013     She has had significant hyperlipidemia including high triglycerides. she is taking Lipitor at night daily but her LDL is still high   Lab Results  Component Value Date   CHOL 187 09/20/2014   HDL 33.00* 09/20/2014   LDLDIRECT 119.9 09/20/2014   TRIG 216.0* 09/20/2014   CHOLHDL 6 09/20/2014     She has had history of hair loss.  Testosterone and DHEAS normal previously She is not having any menstrual cycles at present   Physical Examination:  BP 128/78 mmHg  Pulse 104  Temp(Src) 97.6 F (36.4 C)  Resp 14  Ht 4\' 10"  (1.473 m)  Wt 116 lb (52.617 kg)  BMI 24.25 kg/m2  SpO2 97%       Exam not indicated  ASSESSMENT/PLAN:   Diabetes type 1:   Blood sugars are  still poor with regimen of twice a day NPH and regular insulin  See history of present illness for detailed discussion of her current management, blood sugar patterns, diet and problems identified Difficult to control of fasting readings with NPH and previously had tended to have low sugars overnight if was given higher doses Blood sugar before supper time are variable also She appears to be needing a true basal insulin for better control Also she appears to be taking her regular insulin randomly instead  of 15-30 minutes before eating Diet is variable and this may be causing fluctuation in her sugars  Since she does not want to try the V-go pump she will be given a trial of Toujeo instead of NPH with the co-pay card Detailed instructions given and explained  to her how this is to be used and to call if blood sugars are not well-controlled She will also change her regular insulin to be taken 15-30 minutes before meals Given blood sugar diary to keep a record  Counseling time over 50% of today's 25 minute visit  Patient Instructions  If starting Toujeo take 50 units on waking up once a day and stop Humulin N insulin  Regular insulin 15-30 min before every meal, same dose  Si a partir Toujeo tomar 50 unidades al despertar una vez al da y dejar de Humulina N insulina  Regular la insulina 15-30 minutos antes de cada comida, misma dosis  Please check blood sugars at least half the time about 2 hours after any meal and times per week on waking up. Please bring blood sugar diary to each visit  Increase Atorvastatin to 40 mg      Rick Warnick 09/23/2014, 9:15 PM

## 2014-10-20 ENCOUNTER — Telehealth: Payer: Self-pay | Admitting: *Deleted

## 2014-10-20 NOTE — Telephone Encounter (Signed)
The rx was originally written for 50 units daily, she doubled it on her own. And her insurance won't cover another rx until the 27th.

## 2014-10-20 NOTE — Telephone Encounter (Signed)
She cannot take that much, can go back on Novolon N till 27th then use 60 Toujeo

## 2014-10-20 NOTE — Telephone Encounter (Signed)
[facility] pharmacist called, patient went in for a refill of her Toujeo but it is to early to be refilled, patient told pharmacist she was taking 100 units of Toujeo instead of the 50 units as written by you.  Patient will not be able to get a refill until 12/27, please advise.

## 2014-10-20 NOTE — Telephone Encounter (Signed)
Needs another rx 50 u daily

## 2014-10-21 ENCOUNTER — Other Ambulatory Visit: Payer: Self-pay | Admitting: *Deleted

## 2014-10-21 MED ORDER — INSULIN REGULAR HUMAN 100 UNIT/ML IJ SOLN: INTRAMUSCULAR | Status: DC

## 2014-10-21 NOTE — Telephone Encounter (Signed)
Noted, I had the translator relay the message to her and a new rx was sent in for the Novolin.

## 2014-11-10 ENCOUNTER — Other Ambulatory Visit: Payer: Self-pay | Admitting: Endocrinology

## 2014-11-10 DIAGNOSIS — Z1231 Encounter for screening mammogram for malignant neoplasm of breast: Secondary | ICD-10-CM

## 2014-11-23 ENCOUNTER — Encounter: Payer: Self-pay | Admitting: Endocrinology

## 2014-11-23 ENCOUNTER — Ambulatory Visit (INDEPENDENT_AMBULATORY_CARE_PROVIDER_SITE_OTHER): Payer: BLUE CROSS/BLUE SHIELD | Admitting: Endocrinology

## 2014-11-23 VITALS — BP 135/83 | HR 107 | Temp 97.9°F | Resp 14 | Ht <= 58 in | Wt 111.2 lb

## 2014-11-23 DIAGNOSIS — IMO0002 Reserved for concepts with insufficient information to code with codable children: Secondary | ICD-10-CM

## 2014-11-23 DIAGNOSIS — R634 Abnormal weight loss: Secondary | ICD-10-CM

## 2014-11-23 DIAGNOSIS — E1165 Type 2 diabetes mellitus with hyperglycemia: Secondary | ICD-10-CM

## 2014-11-23 NOTE — Patient Instructions (Addendum)
Start Regular ( Novolon R clear insulin) about 30 min before meals:  Take 6 units before light breakfast; 12 units before lunch and dinner or larger breakfast  Have a protein with every meal  Reduce Toujeo to 46 units on waking up ( 9 am)  Please check blood sugars at least half the time about 2 hours after any meal and 5 times per week on waking up. Please bring blood sugar monitor to each visit  Call if sugar getting low

## 2014-11-23 NOTE — Progress Notes (Signed)
Patient ID: Gabrielle Waller, female   DOB: 1965-12-14, 49 y.o.   MRN: [id number]   Reason for Appointment : Follow up for Type 1 Diabetes  History of Present Illness          Diagnosis: Type 1 diabetes mellitus, date of diagnosis: 2001         Past history: Her blood sugar at diagnosis was 1056 and was started on insulin She is typically having difficulty controlling her diabetes because of cost of her medications and compliance She is also having relatively labile blood sugars Has difficulty understanding instructions for day-to-day management for diabetes and although she claims to be compliant with her insulin doses as prescribed she does not often check her blood sugars as directed and not clear how her diet affects her blood sugars   INSULIN regimen is described as: Toujeo 50 q am since 11/15/13 Previously was recommended NPH 28 units a.m. -22 at 11pm , regular insulin 20 units before meals   Recent history:  Her blood sugars have been very difficult to control especially with her trying NPH and Regular Insulin previously She also tends to have inconsistent compliance with her instructions Although she was asked to start Toujeo on her last visit in 11/15 she only started this about a week ago Also she was told to continue her regular insulin at mealtimes but she has not done this She also has not checked her blood sugars after meals as directed She does not want to use a brand name monitor because of the high cost of the strips to her Blood sugars today were reviewed from her monitor  Current problems and glucose  patterns:   fasting blood sugars appear to be significantly better in the last week with starting Toujeo, previously were ranging from 79-462 and now they are ranging from 126-183 with only a couple of readings over 150  No hypoglycemia with taking Toujeo in the mornings  As above no blood sugars are being checked after meals but glucose is higher in the office today after  breakfast even without much food  She denies any excessive thirst but her weight has gone down with recent high sugars  No recent A1c available  Her mealtimes are somewhat erratic and her meal content is also inconsistent and she thinks she is eating small portions and her lunch and dinner are similar in size  Glucose monitoring:  done about once a day         Glucometer: [employer] brand     Blood Glucose readings from meter review as above   Self-care:  Meals: 2-3 meals per day.  Quesadilla or bread at 9-10 AM; lunch sometimes skipped otherwise 1 PM and dinner 5-6 PM p.m.           Physical activity: exercise: She is trying to walk daily 11 am   Dietician visit: Most recent:. ?     She has seen diabetes educator several times      Wt Readings from Last 3 Encounters:  11/23/14 111 lb 3.2 oz (50.44 kg)  09/23/14 116 lb (52.617 kg)  07/28/14 116 lb (52.617 kg)   Retinal exam: Most recent: ?   Lab Results  Component Value Date   HGBA1C 8.9* 09/20/2014   HGBA1C 9.3* 03/01/2014   HGBA1C 8.7* 12/02/2013   Lab Results  Component Value Date   MICROALBUR 0.9 09/20/2014   CREATININE 0.6 09/20/2014       Medication List  This list is accurate as of: 11/23/14 12:08 PM.  Always use your most recent med list.               atorvastatin 40 MG tablet  Commonly known as:  LIPITOR  Take 1 tablet (40 mg total) by mouth daily.     Insulin Glargine 300 UNIT/ML Sopn  Commonly known as:  TOUJEO SOLOSTAR  Inject 50 Units into the skin daily.     insulin regular 100 units/mL injection  Commonly known as:  NOVOLIN R RELION  INJECT 20 UNITS SUBCUTANEOUSLY THREE TIMES DAILY BEFORE MEALS     ONE TOUCH ULTRA TEST test strip  Generic drug:  glucose blood  1 each by Other route as needed for other. Use as instructed        Allergies:  Allergies  Allergen Reactions  . Penicillins Itching    Past Medical History  Diagnosis Date  . Diabetes mellitus without complication      No past surgical history on file.  No family history on file.  Social History:  reports that she has never smoked. She has never used smokeless tobacco. She reports that she does not drink alcohol or use illicit drugs.    Review of Systems:   She has a prior history of hyperthyroidism treated with Tapazole in 2001.  Previously has had slightly low TSH and low normal free T4 without recurrence of hyperthyroidism.   Lab Results  Component Value Date   FREET4 0.72 03/01/2014   FREET4 0.67 09/11/2013   TSH 0.27* 03/01/2014   TSH 0.22* 09/11/2013     She has had significant hyperlipidemia including high triglycerides. she is taking Lipitor at night daily but her LDL has not been controlled  Lab Results  Component Value Date   CHOL 187 09/20/2014   HDL 33.00* 09/20/2014   LDLDIRECT 119.9 09/20/2014   TRIG 216.0* 09/20/2014   CHOLHDL 6 09/20/2014     She has had history of hair loss.  Testosterone and DHEAS normal previously She is not having any menstrual cycles at present   Physical Examination:  BP 135/83 mmHg  Pulse 107  Temp(Src) 97.9 F (36.6 C)  Resp 14  Ht 4\' 10"  (1.473 m)  Wt 111 lb 3.2 oz (50.44 kg)  BMI 23.25 kg/m2  SpO2 95%       Exam not indicated  ASSESSMENT/PLAN:   Diabetes type 1:   Blood sugars are looking much better in the morning with starting Toujeo recently Overall she is still not clear about her insulin regimen and has not taken her regular insulin as directed Although she has been instructed on glucose monitoring and timing of insulin she is still inconsistent with her directions Some of her high readings may be related to unbalanced meals with more carbohydrate  For now she can start taking the regular insulin and will use smaller doses since her fasting readings are consistently near normal now Details as in patient instructions As a precaution will lower her Toujeo dose by 4 units for now Her interpreter will review her  instructions given on the printout Reminded her to take her regular insulin 15-30 minutes before meals Given blood sugar diary to keep a record She will also follow-up with nurse educator for review of her compliance and help with insulin adjustment if needed  Weight loss: We will recheck thyroid level on her next visit.  Counseling time over 50% of today's 25 minute visit  Patient Instructions  Start Regular (  Novolon R clear insulin) about 30 min before meals:  Take 6 units before light breakfast; 12 units before lunch and dinner or larger breakfast  Have a protein with every meal  Reduce Toujeo to 46 units on waking up ( 9 am)  Please check blood sugars at least half the time about 2 hours after any meal and 5 times per week on waking up. Please bring blood sugar monitor to each visit  Call if sugar getting low      Kirklin Mcduffee 11/23/2014, 12:08 PM

## 2014-12-08 ENCOUNTER — Other Ambulatory Visit: Payer: Self-pay | Admitting: Endocrinology

## 2014-12-14 ENCOUNTER — Telehealth: Payer: Self-pay | Admitting: Nutrition

## 2014-12-14 ENCOUNTER — Encounter: Payer: BLUE CROSS/BLUE SHIELD | Attending: Endocrinology | Admitting: Nutrition

## 2014-12-14 ENCOUNTER — Encounter: Payer: Self-pay | Admitting: Nutrition

## 2014-12-14 VITALS — Wt 116.0 lb

## 2014-12-14 DIAGNOSIS — E1065 Type 1 diabetes mellitus with hyperglycemia: Secondary | ICD-10-CM | POA: Insufficient documentation

## 2014-12-14 DIAGNOSIS — Z713 Dietary counseling and surveillance: Secondary | ICD-10-CM | POA: Diagnosis not present

## 2014-12-14 DIAGNOSIS — E108 Type 1 diabetes mellitus with unspecified complications: Secondary | ICD-10-CM | POA: Insufficient documentation

## 2014-12-14 DIAGNOSIS — Z794 Long term (current) use of insulin: Secondary | ICD-10-CM | POA: Insufficient documentation

## 2014-12-14 DIAGNOSIS — IMO0002 Reserved for concepts with insufficient information to code with codable children: Secondary | ICD-10-CM

## 2014-12-14 NOTE — Patient Instructions (Signed)
Move Toujeo to HS (9PM).  reduce R insulin acL to 8 and acS to 10.   Take 2u R before eating oatmeal.

## 2014-12-14 NOTE — Telephone Encounter (Signed)
Saw her today. Fastings: 152-341  AcL: 197,  2hrpcS: 79-108,   HS: 291  Says she is getting very hungry 2 hours after lunch and extremely hungry before and after dinner.  Does not test at this time.   Eats a bowl of oatmeal at 9PM with no R insulin  Taking Toujeo 46u at 9AM and R insulin 12acL and acS.   Walks daily after lunch and supper for 30 min.

## 2014-12-14 NOTE — Progress Notes (Signed)
Patient is here with her husband and interpreter.   Typical day: 7AM up, drinks coffee with milk 12PM: 2 eggs, with 2 pieces of toast with butter 2-3 PM  Gets hungry and has piece of fruit--orange or apple 5PM: gets very hungry, husband says she is eating "ferociously"  She never tests her blood sugar acS.  4ounces protein 2-3 servings of carbs, and diet soda  9PM bowl of plain oatmeal  Insulin doses:  9AM: 46u of Toujeo,   R 12u before lunch and supper (can be 15 min. Before or up to 1 hour before)  Exercise:  Walking 30 min.  (2PM), and  6:30PM: for 30 min.  Plan:  Per Dr. Remus BlakeKumar's order: Move Toujeo to HS (9PM).  reduce R insulin acL to 8 and acS to 10.   Take 2u R before eating oatmeal.

## 2014-12-14 NOTE — Telephone Encounter (Signed)
Change Toujeo to bedtime and reduce regular insulin to 8 units at lunch and 10 at suppertime, 2 units for oatmeal at bedtime

## 2014-12-15 NOTE — Telephone Encounter (Signed)
Patient phoned and told of new insulin doses.  She re verbalized the correct doses.

## 2014-12-17 ENCOUNTER — Ambulatory Visit (HOSPITAL_COMMUNITY)
Admission: RE | Admit: 2014-12-17 | Discharge: 2014-12-17 | Disposition: A | Discharge status: Home / Self Care | Payer: BLUE CROSS/BLUE SHIELD | Source: Ambulatory Visit | Attending: Endocrinology | Admitting: Endocrinology

## 2014-12-17 DIAGNOSIS — Z1231 Encounter for screening mammogram for malignant neoplasm of breast: Secondary | ICD-10-CM | POA: Insufficient documentation

## 2014-12-30 ENCOUNTER — Other Ambulatory Visit (INDEPENDENT_AMBULATORY_CARE_PROVIDER_SITE_OTHER): Payer: BLUE CROSS/BLUE SHIELD

## 2014-12-30 DIAGNOSIS — E1165 Type 2 diabetes mellitus with hyperglycemia: Secondary | ICD-10-CM

## 2014-12-30 DIAGNOSIS — IMO0002 Reserved for concepts with insufficient information to code with codable children: Secondary | ICD-10-CM

## 2014-12-30 DIAGNOSIS — R634 Abnormal weight loss: Secondary | ICD-10-CM

## 2014-12-30 LAB — HEMOGLOBIN A1C: Hgb A1c MFr Bld: 9.7 % — ABNORMAL HIGH (ref 4.6–6.5)

## 2014-12-30 LAB — TSH: TSH: 0.15 u[IU]/mL — ABNORMAL LOW (ref 0.35–4.50)

## 2015-01-04 ENCOUNTER — Other Ambulatory Visit: Payer: Self-pay | Admitting: *Deleted

## 2015-01-04 ENCOUNTER — Ambulatory Visit (INDEPENDENT_AMBULATORY_CARE_PROVIDER_SITE_OTHER): Payer: BLUE CROSS/BLUE SHIELD | Admitting: Endocrinology

## 2015-01-04 ENCOUNTER — Encounter: Payer: Self-pay | Admitting: Endocrinology

## 2015-01-04 VITALS — BP 148/82 | HR 112 | Temp 98.0°F | Resp 14 | Ht <= 58 in | Wt 116.6 lb

## 2015-01-04 DIAGNOSIS — E0590 Thyrotoxicosis, unspecified without thyrotoxic crisis or storm: Secondary | ICD-10-CM

## 2015-01-04 DIAGNOSIS — E1165 Type 2 diabetes mellitus with hyperglycemia: Secondary | ICD-10-CM

## 2015-01-04 DIAGNOSIS — IMO0002 Reserved for concepts with insufficient information to code with codable children: Secondary | ICD-10-CM

## 2015-01-04 DIAGNOSIS — E782 Mixed hyperlipidemia: Secondary | ICD-10-CM

## 2015-01-04 MED ORDER — INSULIN GLARGINE 300 UNIT/ML ~~LOC~~ SOPN
52.0000 [IU] | PEN_INJECTOR | Freq: Every day | SUBCUTANEOUS | Status: DC
Start: 2015-01-04 — End: 2015-04-08

## 2015-01-04 MED ORDER — ATORVASTATIN CALCIUM 40 MG PO TABS: 40.0000 mg | ORAL_TABLET | Freq: Every day | ORAL | Status: DC

## 2015-01-04 NOTE — Patient Instructions (Addendum)
Toujeo 52 q am since 11/15/13;  regular insulin 12 units before breakfast, 15 before lunch and 12 before dinner   Call sugars in 1 week  Please check blood sugars at least half the time about 2 hours after any meal and 7 times per week on waking up. Please bring blood sugar monitor to each visit. Recommended blood sugar levels about 2 hours after meal is 140-180 and on waking up 90-130

## 2015-01-04 NOTE — Progress Notes (Signed)
Patient ID: Gabrielle ReelMaria Georgi, female   DOB: [birth year], 49 y.o.   MRN: [id number]   Reason for Appointment : Follow up for Type 1 Diabetes  History of Present Illness          Diagnosis: Type 1 diabetes mellitus, date of diagnosis: 2001         Past history: Her blood sugar at diagnosis was 1056 and was started on insulin She is typically having difficulty controlling her diabetes because of cost of her medications and compliance She is also having relatively labile blood sugars Has difficulty understanding instructions for day-to-day management for diabetes and although she claims to be compliant with her insulin doses as prescribed she does not often check her blood sugars as directed and not clear how her diet affects her blood sugars   INSULIN regimen is described as: Toujeo 46 q am since 11/15/13;  regular insulin 9--10--9 units before meals   Recent history:  Her blood sugars have been very difficult to control and A1c has been usually 9% or more She is usually requiring large doses of insulin despite not being obese. Since starting Toujeo insulin in 11/2014 her blood sugars initially improved significantly; also was able to cut back on her mealtime coverage Although her blood sugars were fairly good in the mornings they have been significantly high for the last 2 weeks or so despite her being compliant with her dose.  Her dose was reduced from 50 down to 46 since morning sugars were starting to get low normal Postprandial sugars: These had been better also earlier this month but now they are mostly high. She has started using a FreeStyle monitor now but has not bought the strips for checking her glucose in the last week   Current problems and glucose  patterns:   Fasting blood sugars have started to increasing recently and consistently over 200.  She says her glucose was 303 this morning  Checking blood sugar somewhat sporadically and difficult to assess postprandial readings especially  after breakfast when she is not monitoring.  Glucose after breakfast today was 405 but was over 300 is morning; she did not take any extra insulin for high reading  Blood sugars after lunch has been as high as 394 with only one recent reading and after supper ranging from 63-373 with no consistent pattern  Hypoglycemia: She had a glucose of 58 after supper on 2/10 but none since then  She is not watching her diet and sometimes she will eat candy bars including last night.  She says that at time she was started eating her food while she is cooking before she has taken any insulin instead of waiting 30 minutes to eat after the insulin   She has about 2-3 servings of carbohydrate at most meals  Glucose monitoring:  done about once a day         Glucometer:  FreeStyle    Blood Glucose readings from meter review download as above  Self-care:  Meals: 2-3 meals per day.  Quesadilla or oatmeal at 9-10 AM; lunch sometimes skipped otherwise 1 PM and dinner 5-6 PM p.m.           Physical activity: exercise: She is trying to walk daily 11 am   Dietician visit: Most recent:. ?     She has seen diabetes educator several times      Wt Readings from Last 3 Encounters:  01/04/15 116 lb 9.6 oz (52.889 kg)  12/14/14 116 lb (52.617 kg)  11/23/14 111 lb 3.2 oz (50.44 kg)   Retinal exam: Most recent: ?   Lab Results  Component Value Date   HGBA1C 9.7* 12/30/2014   HGBA1C 8.9* 09/20/2014   HGBA1C 9.3* 03/01/2014   Lab Results  Component Value Date   MICROALBUR 0.9 09/20/2014   CREATININE 0.6 09/20/2014       Medication List       This list is accurate as of: 01/04/15  5:06 PM.  Always use your most recent med list.               atorvastatin 40 MG tablet  Commonly known as:  LIPITOR  Take 1 tablet (40 mg total) by mouth daily.     Insulin Glargine 300 UNIT/ML Sopn  Commonly known as:  TOUJEO SOLOSTAR  Inject 52 Units into the skin daily.     insulin regular 100 units/mL injection   Commonly known as:  NOVOLIN R RELION  INJECT 20 UNITS SUBCUTANEOUSLY THREE TIMES DAILY BEFORE MEALS     ONE TOUCH ULTRA TEST test strip  Generic drug:  glucose blood  1 each by Other route as needed for other. Use as instructed        Allergies:  Allergies  Allergen Reactions  . Penicillins Itching    Past Medical History  Diagnosis Date  . Diabetes mellitus without complication     No past surgical history on file.  No family history on file.  Social History:  reports that she has never smoked. She has never used smokeless tobacco. She reports that she does not drink alcohol or use illicit drugs.    Review of Systems:   She has a prior history of hyperthyroidism treated with Tapazole in 2001.   She has had slightly low TSH fairly consistently and low normal free T4 without recurrence of hyperthyroidism.  Her labs are about the same now  Lab Results  Component Value Date   FREET4 0.72 03/01/2014   FREET4 0.67 09/11/2013   TSH 0.15* 12/30/2014   TSH 0.27* 03/01/2014   TSH 0.22* 09/11/2013     She has had significant hyperlipidemia including high triglycerides. she is taking Lipitor at night daily but her pharmacy refill her 20 mg dose insert of the 40 mg that she was supposed to be taking.  She thinks she is taking both the doses now  Lab Results  Component Value Date   CHOL 187 09/20/2014   HDL 33.00* 09/20/2014   LDLDIRECT 119.9 09/20/2014   TRIG 216.0* 09/20/2014   CHOLHDL 6 09/20/2014    She is not having any menstrual cycles at present   Physical Examination:  BP 148/82 mmHg  Pulse 112  Temp(Src) 98 F (36.7 C)  Resp 14  Ht  (1.473 m)  Wt 116 lb 9.6 oz (52.889 kg)  BMI 24.38 kg/m2  SpO2 98%       Exam not indicated  ASSESSMENT/PLAN:   Diabetes type 1:   Blood sugars were significantly improved initially with starting Toujeo instead of NPH insulin twice a day However most of her blood sugars are significantly high recently  including fasting Difficult to assess her readings more recently because of infrequent glucose monitoring See history of present illness for discussion on current problems, management and blood sugar patterns  Most likely her poor control is related to her not following her diet recently and eating more carbohydrates and some sweets For now she will need to start increasing all her insulin doses She  will try to take her mealtime insulin 30 minutes before eating consistently Will need to check blood sugar more often to help adjust her mealtime dose Instructed on how to adjust her Toujeo every day days to keep fasting blood sugars below at least 140, flow sheet given All instructions were reviewed with the patient through the interpreter  Abnormal thyroid levels: She still has persistently low TSH without hyperthyroidism.  To check free T3 also on the next visit  Hyperlipidemia: Advised her to take only 40 mg Lipitor daily  Counseling time over 50% of today's 25 minute visit  Patient Instructions  Toujeo 52 q am since 11/15/13;  regular insulin 12 units before breakfast, 15 before lunch and 12 before dinner   Call sugars in 1 week  Please check blood sugars at least half the time about 2 hours after any meal and 7 times per week on waking up. Please bring blood sugar monitor to each visit. Recommended blood sugar levels about 2 hours after meal is 140-180 and on waking up 90-130       Toshi Ishii 01/04/2015, 5:06 PM

## 2015-01-05 ENCOUNTER — Other Ambulatory Visit: Payer: Self-pay | Admitting: *Deleted

## 2015-01-05 MED ORDER — GLUCOSE BLOOD VI STRP: ORAL_STRIP | Status: DC

## 2015-01-13 ENCOUNTER — Other Ambulatory Visit: Payer: Self-pay | Admitting: Endocrinology

## 2015-01-14 ENCOUNTER — Other Ambulatory Visit: Payer: Self-pay | Admitting: Endocrinology

## 2015-01-14 MED ORDER — INSULIN NPH (HUMAN) (ISOPHANE) 100 UNIT/ML ~~LOC~~ SUSP: SUBCUTANEOUS | Status: DC

## 2015-02-10 ENCOUNTER — Other Ambulatory Visit (INDEPENDENT_AMBULATORY_CARE_PROVIDER_SITE_OTHER): Payer: BLUE CROSS/BLUE SHIELD

## 2015-02-10 DIAGNOSIS — E1165 Type 2 diabetes mellitus with hyperglycemia: Secondary | ICD-10-CM

## 2015-02-10 DIAGNOSIS — E782 Mixed hyperlipidemia: Secondary | ICD-10-CM | POA: Diagnosis not present

## 2015-02-10 DIAGNOSIS — E0590 Thyrotoxicosis, unspecified without thyrotoxic crisis or storm: Secondary | ICD-10-CM | POA: Diagnosis not present

## 2015-02-10 DIAGNOSIS — IMO0002 Reserved for concepts with insufficient information to code with codable children: Secondary | ICD-10-CM

## 2015-02-10 LAB — LIPID PANEL
CHOLESTEROL: 191 mg/dL (ref 0–200)
HDL: 36.9 mg/dL — AB (ref >39.00)
LDL Cholesterol: 117 mg/dL — ABNORMAL HIGH (ref 0–99)
NonHDL: 154.1
TRIGLYCERIDES: 184 mg/dL — AB (ref 0.0–149.0)
Total CHOL/HDL Ratio: 5
VLDL: 36.8 mg/dL (ref 0.0–40.0)

## 2015-02-10 LAB — T3, FREE: T3, Free: 3.9 pg/mL (ref 2.3–4.2)

## 2015-02-10 LAB — GLUCOSE, RANDOM: Glucose, Bld: 400 mg/dL — ABNORMAL HIGH (ref 70–99)

## 2015-02-11 LAB — FRUCTOSAMINE: FRUCTOSAMINE: 381 umol/L — AB (ref 0–285)

## 2015-02-15 ENCOUNTER — Ambulatory Visit (INDEPENDENT_AMBULATORY_CARE_PROVIDER_SITE_OTHER): Payer: BLUE CROSS/BLUE SHIELD | Admitting: Endocrinology

## 2015-02-15 ENCOUNTER — Encounter: Payer: Self-pay | Admitting: Endocrinology

## 2015-02-15 VITALS — BP 128/70 | HR 102 | Temp 98.3°F | Ht <= 58 in | Wt 119.0 lb

## 2015-02-15 DIAGNOSIS — E1165 Type 2 diabetes mellitus with hyperglycemia: Secondary | ICD-10-CM | POA: Diagnosis not present

## 2015-02-15 DIAGNOSIS — E782 Mixed hyperlipidemia: Secondary | ICD-10-CM | POA: Diagnosis not present

## 2015-02-15 DIAGNOSIS — IMO0002 Reserved for concepts with insufficient information to code with codable children: Secondary | ICD-10-CM

## 2015-02-15 NOTE — Progress Notes (Signed)
Pre visit review using our clinic review tool, if applicable. No additional management support is needed unless otherwise documented below in the visit note. 

## 2015-02-15 NOTE — Patient Instructions (Addendum)
Change Toujeo to 56 units and take it at night not in am Reduce dose to 52 if the am sugar goes below 100  Novolon R, take 24 units if eating starchy foods or bigger meal  Cambiar Toujeo a 56 unidades y tomarlo por la noche no en la maana Reducir la dosis a 52 de la maana si el azcar est por debajo de 100  Novolon R, tener 24 unidades si el consumo de alimentos ricos en almidn

## 2015-02-15 NOTE — Progress Notes (Signed)
Patient ID: Gabrielle Waller, female   DOB: Mar 17, 1966, 49 y.o.   MRN: [id number]   Reason for Appointment : Follow up for Type 1 Diabetes  History of Present Illness          Diagnosis: Type 1 diabetes mellitus, date of diagnosis: 2001         Past history: Her blood sugar at diagnosis was 1056 and was started on insulin She is typically having difficulty controlling her diabetes because of cost of her medications and compliance She is also having relatively labile blood sugars Has difficulty understanding instructions for day-to-day management for diabetes and although she claims to be compliant with her insulin doses as prescribed she does not often check her blood sugars as directed and not clear how her diet affects her blood sugars   Recent history:   INSULIN regimen is described as: Toujeo 52 q am since 11/15/13;  regular insulin 20 units before meals   She is back for 2 month follow-up Her blood sugars have been very difficult to control and A1c has been usually 9% or more On her last visit she was having moderately high readings in the mornings but mostly high readings after meals She was continued on Toujeo in the morning and all her insulin doses were increased by 4-6 units She is usually requiring large doses of insulin despite not being obese.  She has started using a FreeStyle monitor   Current problems and glucose  patterns:   Fasting blood sugars are significantly high most of the time although variable and she does not know why.  Lab glucose was 400   She has done some readings in the afternoons around supper time but none after breakfast or lunch  She has mostly HIGH readings at breakfast and suppertime, on an average higher in the morning  She has relatively better readings after supper when she checks them  Hypoglycemia has been infrequent and only once after walking a lot.  She does not take any extra snack or check her sugar before going walking  Her mealtime  carbohydrate intake is quite variable and sometimes will have a little but is still requiring large doses of REGULAR insulin  She has not adjusted her dose of Regular Insulin unless the blood sugar is high and then will take 25 units  Glucose monitoring:  done about 1-2 times a day         Glucometer:  FreeStyle    Blood Glucose readings from meter review download as above  PRE-MEAL Breakfast Lunch Dinner Bedtime Overall  Glucose range:  158-353    120-276   55-501    Mean/median:  235    200   160  202   Self-care:  Meals: 2-3 meals per day.  Quesadilla or oatmeal at 9-10 AM; lunch sometimes skipped otherwise 1 PM and dinner 5-6  p.m.           Physical activity: exercise: She is trying to walk daily at different times  Dietician visit: Most recent:. ?     She has seen diabetes educator several times      Wt Readings from Last 3 Encounters:  02/15/15 119 lb (53.978 kg)  01/04/15 116 lb 9.6 oz (52.889 kg)  12/14/14 116 lb (52.617 kg)   Retinal exam: Most recent: ?   Lab Results  Component Value Date   HGBA1C 9.7* 12/30/2014   HGBA1C 8.9* 09/20/2014   HGBA1C 9.3* 03/01/2014   Lab Results  Component Value  Date   MICROALBUR 0.9 09/20/2014   LDLCALC 117* 02/10/2015   CREATININE 0.6 09/20/2014       Medication List       This list is accurate as of: 02/15/15  9:00 PM.  Always use your most recent med list.               atorvastatin 40 MG tablet  Commonly known as:  LIPITOR  Take 1 tablet (40 mg total) by mouth daily.     glucose blood test strip  Commonly known as:  FREESTYLE LITE  Use as instructed to check blood sugar 4 times per day dx code E10.65     Insulin Glargine 300 UNIT/ML Sopn  Commonly known as:  TOUJEO SOLOSTAR  Inject 52 Units into the skin daily.     NOVOLIN R RELION 100 units/mL injection  Generic drug:  insulin regular  INJECT 20 UNITS SUBCUTANEOUSLY THREE TIMES DAILY BEFORE MEALS        Allergies:  Allergies  Allergen Reactions  .  Penicillins Itching    Past Medical History  Diagnosis Date  . Diabetes mellitus without complication     No past surgical history on file.  No family history on file.  Social History:  reports that she has never smoked. She has never used smokeless tobacco. She reports that she does not drink alcohol or use illicit drugs.    Review of Systems:   She has a prior history of hyperthyroidism treated with Tapazole in 2001.   She has had slightly low TSH fairly consistently and low normal free T4 without recurrence of hyperthyroidism.    Lab Results  Component Value Date   FREET4 0.72 03/01/2014   FREET4 0.67 09/11/2013   TSH 0.15* 12/30/2014   TSH 0.27* 03/01/2014   TSH 0.22* 09/11/2013     She has had significant hyperlipidemia including high triglycerides. she is taking Lipitor at night daily and has been given 40 mg recently However LDL is still not at target  Lab Results  Component Value Date   CHOL 191 02/10/2015   HDL 36.90* 02/10/2015   LDLCALC 117* 02/10/2015   LDLDIRECT 119.9 09/20/2014   TRIG 184.0* 02/10/2015   CHOLHDL 5 02/10/2015    She is not having any menstrual cycles at present   Physical Examination:  BP 128/70 mmHg  Pulse 102  Temp(Src) 98.3 F (36.8 C) (Oral)  Ht 4\' 10"  (1.473 m)  Wt 119 lb (53.978 kg)  BMI 24.88 kg/m2  SpO2 97%       Exam not indicated  ASSESSMENT/PLAN:   Diabetes type 1:   Blood sugars were relatively better previously with switching from NPH to Toujeo However blood sugars have started going up again despite increasing her doses on her visit about 6 weeks ago She thinks she is not eating excessive amount of carbohydrate but is requiring large amounts of mealtime insulin now Fasting blood sugars are variable and not clear of her Toujeo is lasting 24 hours  Although she has increase her mealtime dose since her last visit she has not understood how to adjust her basal insulin based on fasting readings  For now will  change her Toujeo to the evening and increase the dose 4 units Discussed probably needing to adjust the dose further based on fasting blood sugar trend She will add 4-6 units of regular insulin more for a higher carbohydrate intake along with extra insulin for high readings More consistent monitoring at various times  All  instructions were reviewed with the patient through the interpreter   Hyperlipidemia: Not controlled, doubt if she is compliant with her medication consistently  Counseling time over 50% of today's 25 minute visit  Patient Instructions  Change Toujeo to 56 units and take it at night not in am Reduce dose to 52 if the am sugar goes below 100  Novolon R, take 24 units if eating starchy foods or bigger meal  Cambiar Toujeo a 56 unidades y tomarlo por la noche no en la maana Reducir la dosis a 52 de la maana si el azcar est por debajo de 100  Novolon R, tener 24 unidades si el consumo de alimentos ricos en almidn       Shamar Kracke 02/15/2015, 9:00 PM

## 2015-04-08 ENCOUNTER — Other Ambulatory Visit: Payer: Self-pay | Admitting: Endocrinology

## 2015-04-08 MED ORDER — INSULIN REGULAR HUMAN 100 UNIT/ML IJ SOLN: INTRAMUSCULAR | Status: DC

## 2015-04-08 MED ORDER — INSULIN GLARGINE 300 UNIT/ML ~~LOC~~ SOPN: 56.0000 [IU] | PEN_INJECTOR | Freq: Every day | SUBCUTANEOUS | Status: DC

## 2015-04-08 NOTE — Telephone Encounter (Signed)
Patient called and would like a refill on her medications   Rx: Novolin  Toujeo   Please make sure the dosage instructions are on the Rx   [facility] [place]    Thank you

## 2015-04-08 NOTE — Telephone Encounter (Signed)
Rx sent per pt's request.  

## 2015-04-13 ENCOUNTER — Other Ambulatory Visit: Payer: BLUE CROSS/BLUE SHIELD

## 2015-04-18 ENCOUNTER — Ambulatory Visit (INDEPENDENT_AMBULATORY_CARE_PROVIDER_SITE_OTHER): Payer: BLUE CROSS/BLUE SHIELD | Admitting: Endocrinology

## 2015-04-18 ENCOUNTER — Other Ambulatory Visit: Payer: Self-pay | Admitting: *Deleted

## 2015-04-18 ENCOUNTER — Encounter: Payer: Self-pay | Admitting: Endocrinology

## 2015-04-18 ENCOUNTER — Other Ambulatory Visit (INDEPENDENT_AMBULATORY_CARE_PROVIDER_SITE_OTHER): Payer: BLUE CROSS/BLUE SHIELD

## 2015-04-18 VITALS — BP 122/80 | HR 84 | Temp 98.0°F | Resp 16 | Ht 60.0 in | Wt 113.0 lb

## 2015-04-18 DIAGNOSIS — E1165 Type 2 diabetes mellitus with hyperglycemia: Secondary | ICD-10-CM

## 2015-04-18 DIAGNOSIS — IMO0002 Reserved for concepts with insufficient information to code with codable children: Secondary | ICD-10-CM

## 2015-04-18 DIAGNOSIS — E782 Mixed hyperlipidemia: Secondary | ICD-10-CM

## 2015-04-18 LAB — BASIC METABOLIC PANEL WITH GFR
BUN: 16 mg/dL (ref 6–23)
CO2: 28 meq/L (ref 19–32)
Calcium: 9.6 mg/dL (ref 8.4–10.5)
Chloride: 102 meq/L (ref 96–112)
Creatinine, Ser: 0.63 mg/dL (ref 0.40–1.20)
GFR: 106.84 mL/min
Glucose, Bld: 395 mg/dL — ABNORMAL HIGH (ref 70–99)
Potassium: 4.2 meq/L (ref 3.5–5.1)
Sodium: 137 meq/L (ref 135–145)

## 2015-04-18 LAB — HEMOGLOBIN A1C: Hgb A1c MFr Bld: 9.2 % — ABNORMAL HIGH (ref 4.6–6.5)

## 2015-04-18 MED ORDER — INSULIN GLARGINE 300 UNIT/ML ~~LOC~~ SOPN: 50.0000 [IU] | PEN_INJECTOR | Freq: Every day | SUBCUTANEOUS | Status: DC

## 2015-04-18 MED ORDER — ATORVASTATIN CALCIUM 40 MG PO TABS: 40.0000 mg | ORAL_TABLET | Freq: Every day | ORAL | Status: DC

## 2015-04-18 NOTE — Patient Instructions (Signed)
Toujeo 48 in am   Take regular insulin 20 units before lunch and 16 units other meals  Check blood sugars on waking up .Marland Kitchen 3-4 .Marland Kitchen times a week Also check blood sugars about 2 hours after a meal and do this after different meals by rotation Mark readings as before and after meals  Recommended blood sugar levels on waking up is 90-130 and about 2 hours after meal is 140-180 Please bring blood sugar monitor to each visit.   Toujeo 48 en la maana  Tomar insulina regular antes del almuerzo 20 unidades y 16 unidades de otras comidas  Compruebe azcar en la sangre durante el despertar .Marland Kitchen .. 3-4 veces a la semana Tambin puedes ver los niveles de azcar aproximadamente 2 horas despus de una comida y hacer esto despus de diferentes comidas por la rotacin [employer] como lecturas antes y despus de las comidas  Recomendado niveles de International aid/development worker en la sangre durante el despertar es 90-130 y aproximadamente 2 horas despus de la comida es de 140-180 Favor de traer monitor de Banker a cada visita.

## 2015-04-18 NOTE — Progress Notes (Signed)
Patient ID: Gabrielle Waller, female   DOB: Aug 21, 1966, 49 y.o.   MRN: [id number]   Reason for Appointment : Follow up for Type 1 Diabetes  History of Present Illness          Diagnosis: Type 1 diabetes mellitus, date of diagnosis: 2001         Past history: Her blood sugar at diagnosis was 1056 and was started on insulin She is typically having difficulty controlling her diabetes because of cost of her medications and compliance She is also having relatively labile blood sugars Has difficulty understanding instructions for day-to-day management for diabetes and although she claims to be compliant with her insulin doses as prescribed she does not often check her blood sugars as directed and not clear how her diet affects her blood sugars   Recent history:   INSULIN regimen is described as: Toujeo 52 q am since 11/15/13;  regular insulin 20 units before meals   Her blood sugars have been very difficult to control and A1c has been usually 9% or more On her last visit she was having somewhat higher readings in the mornings and was told to try taking Toujeo in the evenings However she is still taking this in the mornings and has reduced her dose to 52 from 56 as she had a couple of episodes of low blood sugars overnight about 3-4 weeks ago Also checking blood sugar very sporadically No recent A1c available   moderately high readings in the mornings but mostly high readings after meals She was continued on Toujeo in the morning and all her insulin doses were increased by 4-6 units She is usually requiring large doses of insulin despite not being obese.  She has started using a FreeStyle Insulinx monitor   Current problems and glucose  patterns:   Fasting blood sugars have not been checked for at least 2 weeks and the last reading was 140  She is complaining about the cost of Toujeo as the co-pay card is not applicable and not clear if she is taking this recently  She says she is not  checking her blood sugar much now because of her working long hours  Because of language barrier it is difficult to get a complete history from her; not clear if she is consistently taking her mealtime insulin  She thinks her largest meal is at lunchtime but has no blood sugars in the afternoons  She also has had a couple of low sugars at noon and 9 PM last month, not clear which readings are after meals as she does not label these  Also her mealtimes are now variable in the evenings and sometimes will eat at 9 PM in the evening  She has not adjusted her dose of Regular Insulin based on meal size or blood sugar level  Glucose monitoring:  done about 1-2 times a day         Glucometer:  FreeStyle    Blood Glucose readings from meter review download as below, has only 7 readings in the last month  Mean values apply above for all meters except median for One Touch  PRE-MEAL Fasting Lunch Dinner Bedtime Overall  Glucose range: 140  54, 157 68, 120   Mean/median:     88   Self-care:  Meals: 2-3 meals per day.  Quesadilla or oatmeal at 9-10 AM; lunch sometimes skipped otherwise 1 PM and dinner 5-6  p.m.  Physical activity: exercise: She is active at work  Dietician visit: Most recent:. ?     She has seen diabetes educator several times      Wt Readings from Last 3 Encounters:  04/18/15 113 lb (51.256 kg)  02/15/15 119 lb (53.978 kg)  01/04/15 116 lb 9.6 oz (52.889 kg)   Retinal exam: Most recent: ?   Lab Results  Component Value Date   HGBA1C 9.2* 04/18/2015   HGBA1C 9.7* 12/30/2014   HGBA1C 8.9* 09/20/2014   Lab Results  Component Value Date   MICROALBUR 0.9 09/20/2014   LDLCALC 117* 02/10/2015   CREATININE 0.63 04/18/2015       Medication List       This list is accurate as of: 04/18/15  9:42 PM.  Always use your most recent med list.               atorvastatin 40 MG tablet  Commonly known as:  LIPITOR  Take 1 tablet (40 mg total) by mouth daily.      glucose blood test strip  Commonly known as:  FREESTYLE LITE  Use as instructed to check blood sugar 4 times per day dx code E10.65     Insulin Glargine 300 UNIT/ML Sopn  Commonly known as:  TOUJEO SOLOSTAR  Inject 50 Units into the skin daily.     insulin regular 100 units/mL injection  Commonly known as:  NOVOLIN R RELION  INJECT 24 UNITS SUBCUTANEOUSLY THREE TIMES DAILY BEFORE MEALS        Allergies:  Allergies  Allergen Reactions  . Penicillins Itching    Past Medical History  Diagnosis Date  . Diabetes mellitus without complication     No past surgical history on file.  No family history on file.  Social History:  reports that she has never smoked. She has never used smokeless tobacco. She reports that she does not drink alcohol or use illicit drugs.    Review of Systems:   She has a prior history of hyperthyroidism treated with Tapazole in 2001.   She has had slightly low TSH fairly consistently and low normal free T4 without recurrence of hyperthyroidism.    Lab Results  Component Value Date   FREET4 0.72 03/01/2014   FREET4 0.67 09/11/2013   TSH 0.15* 12/30/2014   TSH 0.27* 03/01/2014   TSH 0.22* 09/11/2013     She has had significant hyperlipidemia including high triglycerides. she says she is taking Lipitor at night daily and has been given 40 mg  However LDL is still not at target  Lab Results  Component Value Date   CHOL 191 02/10/2015   HDL 36.90* 02/10/2015   LDLCALC 117* 02/10/2015   LDLDIRECT 119.9 09/20/2014   TRIG 184.0* 02/10/2015   CHOLHDL 5 02/10/2015    She is not having any menstrual cycles    Physical Examination:  BP 122/80 mmHg  Pulse 84  Temp(Src) 98 F (36.7 C) (Oral)  Resp 16  Ht 5' (1.524 m)  Wt 113 lb (51.256 kg)  BMI 22.07 kg/m2  SpO2 97%       Exam not indicated  ASSESSMENT/PLAN:   Diabetes type 1:  See history of present illness for detailed discussion of his current management, blood sugar patterns and  problems identified  Blood sugars were relatively better previously with switching from NPH to Toujeo However blood sugars have been quite variable and were periodic low last month for no apparent reason She has not checked any blood  sugars since 5/27 and not clear how compliant she is with insulin currently A1c needs to be checked today to help assess her overall control  For now since her blood sugars have been periodically low overnight will reduce her Toujeo from 52 down to 48 units Also since she is more active with work she will reduce her breakfast and supper dose by 4 units More consistent monitoring at various times as discussed today, blood sugar targets were also discussed Will need to further reassess her insulin doses when she is monitoring more blood sugars and call back for follow-up She was shown how to mark the readings after meals on her new monitor as before or after meals  All instructions were reviewed with the patient through the interpreter and printout was given in Spanish also Co-pay card for Toujeo given  Hyperlipidemia: To check lipid levels again  Counseling time over 50% of today's 25 minute visit  Patient Instructions  Toujeo 48 in am   Take regular insulin 20 units before lunch and 16 units other meals  Check blood sugars on waking up .Marland Kitchen 3-4 .Marland Kitchen times a week Also check blood sugars about 2 hours after a meal and do this after different meals by rotation Mark readings as before and after meals  Recommended blood sugar levels on waking up is 90-130 and about 2 hours after meal is 140-180 Please bring blood sugar monitor to each visit.   Toujeo 48 en la maana  Tomar insulina regular antes del almuerzo 20 unidades y 16 unidades de otras comidas  Compruebe azcar en la sangre durante el despertar .Marland Kitchen .. 3-4 veces a la semana Tambin puedes ver los niveles de azcar aproximadamente 2 horas despus de una comida y hacer esto despus de diferentes comidas  por la rotacin Cabin crew como lecturas antes y despus de las comidas  Recomendado niveles de International aid/development worker en la sangre durante el despertar es 90-130 y aproximadamente 2 horas despus de la comida es de 140-180 Favor de traer monitor de Banker a cada visita.       Mimi Debellis 04/18/2015, 9:42 PM    Addendum: A1c and glucose are high, suspect noncompliance Will probably need her to take higher doses of mealtime insulin at breakfast and lunch at least  Lab on 04/18/2015  Component Date Value Ref Range Status  . Hgb A1c MFr Bld 04/18/2015 9.2* 4.6 - 6.5 % Final   Glycemic Control Guidelines for People with Diabetes:Non Diabetic:  <6%Goal of Therapy: <7%Additional Action Suggested:  >8%   . Sodium 04/18/2015 137  135 - 145 mEq/L Final  . Potassium 04/18/2015 4.2  3.5 - 5.1 mEq/L Final  . Chloride 04/18/2015 102  96 - 112 mEq/L Final  . CO2 04/18/2015 28  19 - 32 mEq/L Final  . Glucose, Bld 04/18/2015 395* 70 - 99 mg/dL Final  . BUN 16/08/9603 16  6 - 23 mg/dL Final  . Creatinine, Ser 04/18/2015 0.63  0.40 - 1.20 mg/dL Final  . Calcium 54/07/8118 9.6  8.4 - 10.5 mg/dL Final  . GFR 14/78/2956 106.84  >60.00 mL/min Final

## 2015-05-25 ENCOUNTER — Other Ambulatory Visit: Payer: BLUE CROSS/BLUE SHIELD

## 2015-05-30 ENCOUNTER — Encounter: Payer: Self-pay | Admitting: *Deleted

## 2015-05-30 ENCOUNTER — Telehealth: Payer: Self-pay | Admitting: Endocrinology

## 2015-05-30 ENCOUNTER — Ambulatory Visit: Payer: BLUE CROSS/BLUE SHIELD | Admitting: Endocrinology

## 2015-05-30 DIAGNOSIS — Z0289 Encounter for other administrative examinations: Secondary | ICD-10-CM

## 2015-05-30 NOTE — Telephone Encounter (Signed)
Letter mailed

## 2015-05-30 NOTE — Telephone Encounter (Signed)
Patient no showed today's appt. Please advise on how to follow up. °A. No follow up necessary. °B. Follow up urgent. Contact patient immediately. °C. Follow up necessary. Contact patient and schedule visit in ___ days. °D. Follow up advised. Contact patient and schedule visit in ____weeks. ° °

## 2015-05-30 NOTE — Telephone Encounter (Signed)
Needs to be rescheduled at earliest possible 

## 2015-06-14 ENCOUNTER — Encounter: Payer: Self-pay | Admitting: Endocrinology

## 2015-07-01 ENCOUNTER — Other Ambulatory Visit (INDEPENDENT_AMBULATORY_CARE_PROVIDER_SITE_OTHER): Payer: BLUE CROSS/BLUE SHIELD

## 2015-07-01 DIAGNOSIS — E1165 Type 2 diabetes mellitus with hyperglycemia: Secondary | ICD-10-CM

## 2015-07-01 DIAGNOSIS — IMO0002 Reserved for concepts with insufficient information to code with codable children: Secondary | ICD-10-CM

## 2015-07-01 LAB — BASIC METABOLIC PANEL
BUN: 12 mg/dL (ref 6–23)
CALCIUM: 9.4 mg/dL (ref 8.4–10.5)
CO2: 28 meq/L (ref 19–32)
Chloride: 107 mEq/L (ref 96–112)
Creatinine, Ser: 0.54 mg/dL (ref 0.40–1.20)
GFR: 127.54 mL/min (ref >60.00)
Glucose, Bld: 88 mg/dL (ref 70–99)
Potassium: 4.3 mEq/L (ref 3.5–5.1)
SODIUM: 143 meq/L (ref 135–145)

## 2015-07-02 LAB — FRUCTOSAMINE: Fructosamine: 323 umol/L — ABNORMAL HIGH (ref 0–285)

## 2015-07-06 ENCOUNTER — Encounter: Payer: Self-pay | Admitting: Endocrinology

## 2015-07-06 ENCOUNTER — Ambulatory Visit (INDEPENDENT_AMBULATORY_CARE_PROVIDER_SITE_OTHER): Payer: BLUE CROSS/BLUE SHIELD | Admitting: Endocrinology

## 2015-07-06 ENCOUNTER — Other Ambulatory Visit: Payer: Self-pay | Admitting: *Deleted

## 2015-07-06 VITALS — BP 134/86 | HR 98 | Temp 97.8°F | Resp 14 | Ht 60.0 in | Wt 114.0 lb

## 2015-07-06 DIAGNOSIS — IMO0002 Reserved for concepts with insufficient information to code with codable children: Secondary | ICD-10-CM

## 2015-07-06 DIAGNOSIS — E1165 Type 2 diabetes mellitus with hyperglycemia: Secondary | ICD-10-CM | POA: Diagnosis not present

## 2015-07-06 DIAGNOSIS — E782 Mixed hyperlipidemia: Secondary | ICD-10-CM

## 2015-07-06 MED ORDER — INSULIN DEGLUDEC 200 UNIT/ML ~~LOC~~ SOPN: 44.0000 [IU] | PEN_INJECTOR | Freq: Every day | SUBCUTANEOUS | Status: DC

## 2015-07-06 NOTE — Patient Instructions (Addendum)
Toujeo or TRESIBA N0W 44 units  Regular insulin 20 units before Bfst; 16 before lunch and 24 at dinner

## 2015-07-06 NOTE — Progress Notes (Signed)
Patient ID: Gabrielle Waller, female   DOB: Mar 23, 1966, 49 y.o.   MRN: [id number]   Reason for Appointment : Follow up for Type 1 Diabetes  History of Present Illness          Diagnosis: Type 1 diabetes mellitus, date of diagnosis: 2001         Past history: Her blood sugar at diagnosis was 1056 and was started on insulin She is typically having difficulty controlling her diabetes because of cost of her medications and compliance She is also having relatively labile blood sugars Has difficulty understanding instructions for day-to-day management for diabetes and although she claims to be compliant with her insulin doses as prescribed she does not often check her blood sugars as directed and not clear how her diet affects her blood sugars   Recent history:   INSULIN regimen is described as: Toujeo 48 q am; regular insulin 20 units before meals   Her blood sugars have been very difficult to control and A1c has been usually 9% or more Her Toujeo dose was reduced on her last visit because of tendency to overnight hypoglycemia She still continues to take this in the mornings She has checked her blood sugar somewhat more compared to her last visit but difficult to get a consistent pattern again  She is again complaining about the cost of her basal insulin, was given a co-pay card for free insulin previously Last A1c was still high at 9.2  She is usually requiring large doses of insulin despite not being obese.  She has started using a FreeStyle Insulinx monitor   Current problems and glucose  patterns:   Fasting blood sugars  are quite variable and she does not know how to explain these  She has had a few blood sugar tests during the night and these appear to be relatively low  She has a few readings at lunchtime and has couple of very high readings were also a low reading around 11 AM.  Blood sugars at suppertime are relatively better  She has mostly high readings at bedtime  although again not very consistent and has not checked many of these readings recently  She has not adjusted her dose of Regular Insulin based on meal size or blood sugar level.  She thinks she is taking her insulin up to 30 minutes before eating and does not take any at bedtime given when blood sugars are high    hypoglycemia: She has low readings on her monitor during the night, sporadically at 11 AM or 5 PM with the lowest reading 37 overnight  Glucose monitoring:  done about 1-2 times a day         Glucometer:  FreeStyle    Blood Glucose readings from meter review download as below, has only 7 readings in the last month  Mean values apply above for all meters except median for One Touch  PRE-MEAL Fasting  11 AM-1 PM  Dinner Bedtime Overall  Glucose range:  70-325   47-452   54-127   75-390   Mean/median:  179    210  182    Self-care:  Meals: 2-3 meals per day.  Quesadilla or oatmeal at 6-7 AM; lunch 12 PM and dinner 7  p.m.           Physical activity: exercise: She is active at work  Dietician visit: Most recent:. ?     She has seen diabetes educator several times  Wt Readings from Last 3 Encounters:  07/06/15 114 lb (51.71 kg)  04/18/15 113 lb (51.256 kg)  02/15/15 119 lb (53.978 kg)   Retinal exam: Most recent: ?   Lab Results  Component Value Date   HGBA1C 9.2* 04/18/2015   HGBA1C 9.7* 12/30/2014   HGBA1C 8.9* 09/20/2014   Lab Results  Component Value Date   MICROALBUR 0.9 09/20/2014   LDLCALC 117* 02/10/2015   CREATININE 0.54 07/01/2015       Medication List       This list is accurate as of: 07/06/15  4:02 PM.  Always use your most recent med list.               atorvastatin 40 MG tablet  Commonly known as:  LIPITOR  Take 1 tablet (40 mg total) by mouth daily.     glucose blood test strip  Commonly known as:  FREESTYLE LITE  Use as instructed to check blood sugar 4 times per day dx code E10.65     Insulin Degludec 200 UNIT/ML Sopn  Commonly  known as:  TRESIBA FLEXTOUCH  Inject 44 Units into the skin daily.     Insulin Glargine 300 UNIT/ML Sopn  Commonly known as:  TOUJEO SOLOSTAR  Inject 50 Units into the skin daily.     insulin regular 100 units/mL injection  Commonly known as:  NOVOLIN R RELION  INJECT 24 UNITS SUBCUTANEOUSLY THREE TIMES DAILY BEFORE MEALS        Allergies:  Allergies  Allergen Reactions  . Penicillins Itching    Past Medical History  Diagnosis Date  . Diabetes mellitus without complication     No past surgical history on file.  No family history on file.  Social History:  reports that she has never smoked. She has never used smokeless tobacco. She reports that she does not drink alcohol or use illicit drugs.    Review of Systems:   She has a prior history of hyperthyroidism treated with Tapazole in 2001.   She has had slightly low TSH fairly consistently and low normal free T4 without recurrence of hyperthyroidism.    Lab Results  Component Value Date   FREET4 0.72 03/01/2014   FREET4 0.67 09/11/2013   TSH 0.15* 12/30/2014   TSH 0.27* 03/01/2014   TSH 0.22* 09/11/2013     She has had significant hyperlipidemia including high triglycerides. she says she is taking Lipitor at night daily and has been given 40 mg  However LDL was over 100  Lab Results  Component Value Date   CHOL 191 02/10/2015   HDL 36.90* 02/10/2015   LDLCALC 117* 02/10/2015   LDLDIRECT 119.9 09/20/2014   TRIG 184.0* 02/10/2015   CHOLHDL 5 02/10/2015    She is not having any menstrual cycles    Physical Examination:  BP 134/86 mmHg  Pulse 98  Temp(Src) 97.8 F (36.6 C)  Resp 14  Ht 5' (1.524 m)  Wt 114 lb (51.71 kg)  BMI 22.26 kg/m2  SpO2 98%       Exam not indicated  ASSESSMENT/PLAN:   Diabetes type 1:  See history of present illness for detailed discussion of his current management, blood sugar patterns and problems identified  Blood sugars were relatively better previously with  switching from NPH to Toujeo  Recently she has tried to check her blood sugars more often as directed but difficult to get a consistent pattern She is still not marking her postprandial readings as directed She is stating  that she has having low sugars during the night periodically and has found readings as low as 37   However blood sugars have been quite variable  at all times Overall the highest blood sugars are after supper and occasionally at lunch She thinks she is compliant with her insulin but previous history indicates that she does not always do so   For now we'll reduce her insulin at lunch because of lower readings in the afternoon and her being more active and also reduce her basal insulin, see instructions:  All instructions were reviewed with the patient through the interpreter and printout was given in Spanish also Co-pay card for [place] given, she may be able to get this better covered on her insurance  Discussed balanced meals and timing of insulin She will call if she has unusually high readings or continued hypoglycemia   Hyperlipidemia: To check lipid levels again on the next visit   Counseling time on subjects discussed above is over 50% of today's 25 minute visit   Patient Instructions  Toujeo or TRESIBA N0W 44 units  Regular insulin 20 units before Bfst; 16 before lunch and 24 at dinner      Erricka Falkner 07/06/2015, 4:02 PM    Addendum: A1c and glucose are high, suspect noncompliance Will probably need her to take higher doses of mealtime insulin at breakfast and lunch at least  Appointment on 07/01/2015  Component Date Value Ref Range Status  . Sodium 07/01/2015 143  135 - 145 mEq/L Final  . Potassium 07/01/2015 4.3  3.5 - 5.1 mEq/L Final  . Chloride 07/01/2015 107  96 - 112 mEq/L Final  . CO2 07/01/2015 28  19 - 32 mEq/L Final  . Glucose, Bld 07/01/2015 88  70 - 99 mg/dL Final  . BUN 16/08/9603 12  6 - 23 mg/dL Final  . Creatinine, Ser 07/01/2015 0.54   0.40 - 1.20 mg/dL Final  . Calcium 54/07/8118 9.4  8.4 - 10.5 mg/dL Final  . GFR 14/78/2956 127.54  >60.00 mL/min Final  . Fructosamine 07/01/2015 323* 0 - 285 umol/L Final   Comment: Published reference interval for apparently healthy subjects between age 57 and 11 is 23 - 285 umol/L and in a poorly controlled diabetic population is 228 - 563 umol/L with a mean of 396 umol/L.

## 2015-08-31 ENCOUNTER — Other Ambulatory Visit (INDEPENDENT_AMBULATORY_CARE_PROVIDER_SITE_OTHER): Payer: BLUE CROSS/BLUE SHIELD

## 2015-08-31 DIAGNOSIS — E782 Mixed hyperlipidemia: Secondary | ICD-10-CM | POA: Diagnosis not present

## 2015-08-31 DIAGNOSIS — IMO0002 Reserved for concepts with insufficient information to code with codable children: Secondary | ICD-10-CM

## 2015-08-31 DIAGNOSIS — E1165 Type 2 diabetes mellitus with hyperglycemia: Secondary | ICD-10-CM

## 2015-08-31 LAB — BASIC METABOLIC PANEL
BUN: 15 mg/dL (ref 6–23)
CALCIUM: 9.7 mg/dL (ref 8.4–10.5)
CO2: 29 mEq/L (ref 19–32)
Chloride: 107 mEq/L (ref 96–112)
Creatinine, Ser: 0.62 mg/dL (ref 0.40–1.20)
GFR: 108.67 mL/min (ref >60.00)
GLUCOSE: 169 mg/dL — AB (ref 70–99)
Potassium: 4.4 mEq/L (ref 3.5–5.1)
SODIUM: 143 meq/L (ref 135–145)

## 2015-08-31 LAB — LIPID PANEL
CHOLESTEROL: 154 mg/dL (ref 0–200)
HDL: 39.5 mg/dL (ref >39.00)
LDL Cholesterol: 86 mg/dL (ref 0–99)
NONHDL: 114.35
Total CHOL/HDL Ratio: 4
Triglycerides: 142 mg/dL (ref 0.0–149.0)
VLDL: 28.4 mg/dL (ref 0.0–40.0)

## 2015-08-31 LAB — HEMOGLOBIN A1C: Hgb A1c MFr Bld: 9.5 % — ABNORMAL HIGH (ref 4.6–6.5)

## 2015-09-05 ENCOUNTER — Encounter: Payer: Self-pay | Admitting: Endocrinology

## 2015-09-05 ENCOUNTER — Ambulatory Visit (INDEPENDENT_AMBULATORY_CARE_PROVIDER_SITE_OTHER): Payer: BLUE CROSS/BLUE SHIELD | Admitting: Endocrinology

## 2015-09-05 VITALS — BP 130/68 | HR 94 | Temp 98.3°F | Ht 60.0 in | Wt 112.0 lb

## 2015-09-05 DIAGNOSIS — E1065 Type 1 diabetes mellitus with hyperglycemia: Secondary | ICD-10-CM

## 2015-09-05 DIAGNOSIS — E0590 Thyrotoxicosis, unspecified without thyrotoxic crisis or storm: Secondary | ICD-10-CM

## 2015-09-05 DIAGNOSIS — E782 Mixed hyperlipidemia: Secondary | ICD-10-CM | POA: Diagnosis not present

## 2015-09-05 DIAGNOSIS — Z23 Encounter for immunization: Secondary | ICD-10-CM | POA: Diagnosis not present

## 2015-09-05 NOTE — Patient Instructions (Signed)
Toujeo 46 units at dinner time not am  Regular insulin 20 before dinner and if sugar at bedtime still over 200 take 22, also take 22 or 24 with more starchy foods   Check blood sugars on waking up 3  times a week Also check blood sugars about 2 hours after a meal and do this after different meals by rotation  Recommended blood sugar levels on waking up is 90-130 and about 2 hours after meal is 130-160  Please bring your blood sugar monitor to each visit, thank you    Toujeo 46 unidades en el momento de la cena no soy  La insulina regular 20 antes de la cena y si el azcar antes de dormir todava ms de 200 toma 22, tambin tomar 22 o 24 con ms alimentos con almidn   Compruebe azcar en la sangre durante el despertar 3 veces a la semana Tambin puedes ver los niveles de azcar aproximadamente 2 horas despus de Physiological scientistuna comida y hacer esto despus de diferentes comidas por la rotacin  Recomendado niveles de azcar en la sangre durante el despertar es 90-130 y aproximadamente 2 horas despus de la comida es 130-160  Por favor traiga su monitoreo de Psychologist, counsellingazcar en la sangre a cada visita, gracias

## 2015-09-05 NOTE — Progress Notes (Signed)
Patient ID: Gabrielle Waller, female   DOB: 06-27-1966, 49 y.o.   MRN: [id number]   Reason for Appointment : Follow up for Type 1 Diabetes  History of Present Illness          Diagnosis: Type 1 diabetes mellitus, date of diagnosis: 2001         Past history: Her blood sugar at diagnosis was 1056 and was started on insulin She is typically having difficulty controlling her diabetes because of cost of her medications and compliance She is also having relatively labile blood sugars Has difficulty understanding instructions for day-to-day management for diabetes and although she claims to be compliant with her insulin doses as prescribed she does not often check her blood sugars as directed and not clear how her diet affects her blood sugars   Recent history:   INSULIN regimen is described as: Toujeo 44 q am; regular insulin 20 units -15-15  Her blood sugars have been very difficult to control and A1c has been usually 9% or more Her Toujeo dose was reduced on her last visit because of tendency to overnight hypoglycemia She has checked her blood sugar very sporadically again despite reminders Last A1c was still high at 9.5 She is usually requiring large doses of insulin despite not being obese.  She has started using a FreeStyle Insulinx monitor   Current problems and glucose  patterns:   Fasting blood sugars are variable, has only about 4 readings for the last month and 2 of them are high  Does not report overnight hypoglycemia as before  She now says that since she felt her blood sugar getting lower in the afternoon she reduced her insulin doses at lunch and dinner on her own.  She was supposed to take 24 units of regular insulin at suppertime  She thinks she does try to take a regular insulin 30 units before planning to eat  She has not checked her blood sugars before after lunch at all  She says that once she did not eat any food in the morning and with her Toujeo felt  hypoglycemic during the day  She has mostly high readings at bedtime although again and has 3 readings to look at  She has not adjusted her dose of Regular Insulin based on meal size or blood sugar level.    Glucose monitoring:  done less than 1 times a day         Glucometer:  FreeStyle    Blood Glucose readings from meter review download as below, has only 6 readings in the last month  Mean values apply above for all meters except median for One Touch  PRE-MEAL Fasting Lunch Dinner Bedtime Overall  Glucose range:  68-228     198-314    Mean/median:      190    Self-care:  Meals: 2-3 meals per day.  Quesadilla or oatmeal at 6-7 AM; lunch 12 PM and dinner 7  p.m.           Physical activity: exercise: She is active at work but not working as much  Dietician visit: Most recent:. ?     She has seen diabetes educator several times      Wt Readings from Last 3 Encounters:  09/05/15 112 lb (50.803 kg)  07/06/15 114 lb (51.71 kg)  04/18/15 113 lb (51.256 kg)     Lab Results  Component Value Date   HGBA1C 9.5* 08/31/2015   HGBA1C 9.2* 04/18/2015  HGBA1C 9.7* 12/30/2014   Lab Results  Component Value Date   MICROALBUR 0.9 09/20/2014   LDLCALC 86 08/31/2015   CREATININE 0.62 08/31/2015       Medication List       This list is accurate as of: 09/05/15  8:06 AM.  Always use your most recent med list.               atorvastatin 40 MG tablet  Commonly known as:  LIPITOR  Take 1 tablet (40 mg total) by mouth daily.     glucose blood test strip  Commonly known as:  FREESTYLE LITE  Use as instructed to check blood sugar 4 times per day dx code E10.65     Insulin Degludec 200 UNIT/ML Sopn  Commonly known as:  TRESIBA FLEXTOUCH  Inject 44 Units into the skin daily.     insulin regular 100 units/mL injection  Commonly known as:  NOVOLIN R RELION  INJECT 24 UNITS SUBCUTANEOUSLY THREE TIMES DAILY BEFORE MEALS        Allergies:  Allergies  Allergen Reactions  .  Penicillins Itching    Past Medical History  Diagnosis Date  . Diabetes mellitus without complication (HCC)     No past surgical history on file.  No family history on file.  Social History:  reports that she has never smoked. She has never used smokeless tobacco. She reports that she does not drink alcohol or use illicit drugs.    Review of Systems:   She has a prior history of hyperthyroidism treated with Tapazole in 2001.   She has had slightly low TSH fairly consistently and low normal free T4 without recurrence of hyperthyroidism.    Lab Results  Component Value Date   FREET4 0.72 03/01/2014   FREET4 0.67 09/11/2013   TSH 0.15* 12/30/2014   TSH 0.27* 03/01/2014   TSH 0.22* 09/11/2013     She has had significant hyperlipidemia including high triglycerides. she says she is taking Lipitor at night daily and has been given 40 mg  LDL is now improved  Lab Results  Component Value Date   CHOL 154 08/31/2015   HDL 39.50 08/31/2015   LDLCALC 86 08/31/2015   LDLDIRECT 119.9 09/20/2014   TRIG 142.0 08/31/2015   CHOLHDL 4 08/31/2015    She is not having any menstrual cycles    Physical Examination:  BP 142/86 mmHg  Pulse 94  Temp(Src) 98.3 F (36.8 C) (Oral)  Ht 5' (1.524 m)  Wt 112 lb (50.803 kg)  BMI 21.87 kg/m2  SpO2 93%      Repeat blood pressure normal No edema  ASSESSMENT/PLAN:   Diabetes type 1:  See history of present illness for detailed discussion of his current management, blood sugar patterns and problems identified  Blood sugars still not controlled with A1c consistently over 9% Difficult to make adjustments in her doses as she does not check her blood sugars as directed and relatively infrequently Has some readings before breakfast and at bedtime only Discussed need to check blood sugars consistently at least 2 weeks before her next visit  She has on her own reduce her suppertime coverage without realizing that this would raise her sugars  at night Also not clear if sugars are high after breakfast She may do better with taking Toujeo in the evening instead of morning as she has occasional tendency to low sugars during the day without food  Advised her to increase her Toujeo by 2 units and suppertime  regular by 4 units at least; also needs to adjust her dose based on carbohydrate intake.  Discussed blood sugar targets at least after suppertime to be at least under 200 She will call if she has unusually high readings or hypoglycemia  Insulin doses recommended:  Toujeo 46 units at dinner time not am  Regular insulin 20 before dinner and if sugar at bedtime still over 200 take 22, also take 22 or 24 with more starchy foods   Hyperlipidemia: To continue Lipitor 40 mg without change  Counseling time on subjects discussed above is over 50% of today's 25 minute visit  Influenza vaccine given  There are no Patient Instructions on file for this visit.    Mckoy Bhakta 09/05/2015, 8:06 AM      Appointment on 08/31/2015  Component Date Value Ref Range Status  . Hgb A1c MFr Bld 08/31/2015 9.5* 4.6 - 6.5 % Final   Glycemic Control Guidelines for People with Diabetes:Non Diabetic:  <6%Goal of Therapy: <7%Additional Action Suggested:  >8%   . Sodium 08/31/2015 143  135 - 145 mEq/L Final  . Potassium 08/31/2015 4.4  3.5 - 5.1 mEq/L Final  . Chloride 08/31/2015 107  96 - 112 mEq/L Final  . CO2 08/31/2015 29  19 - 32 mEq/L Final  . Glucose, Bld 08/31/2015 169* 70 - 99 mg/dL Final  . BUN 11/91/478210/26/2016 15  6 - 23 mg/dL Final  . Creatinine, Ser 08/31/2015 0.62  0.40 - 1.20 mg/dL Final  . Calcium 95/62/130810/26/2016 9.7  8.4 - 10.5 mg/dL Final  . GFR 65/78/469610/26/2016 108.67  >60.00 mL/min Final  . Cholesterol 08/31/2015 154  0 - 200 mg/dL Final   ATP III Classification       Desirable:  < 200 mg/dL               Borderline High:  200 - 239 mg/dL          High:  > = 295240 mg/dL  . Triglycerides 08/31/2015 142.0  0.0 - 149.0 mg/dL Final   Normal:  <284<150  mg/dLBorderline High:  150 - 199 mg/dL  . HDL 08/31/2015 39.50  >39.00 mg/dL Final  . VLDL 13/24/401010/26/2016 28.4  0.0 - 40.0 mg/dL Final  . LDL Cholesterol 08/31/2015 86  0 - 99 mg/dL Final  . Total CHOL/HDL Ratio 08/31/2015 4   Final                  Men          Women1/2 Average Risk     3.4          3.3Average Risk          5.0          4.42X Average Risk          9.6          7.13X Average Risk          15.0          11.0                      . NonHDL 08/31/2015 114.35   Final   NOTE:  Non-HDL goal should be 30 mg/dL higher than patient's LDL goal (i.e. LDL goal of < 70 mg/dL, would have non-HDL goal of < 100 mg/dL)

## 2015-10-01 ENCOUNTER — Other Ambulatory Visit: Payer: Self-pay | Admitting: Endocrinology

## 2015-12-02 ENCOUNTER — Other Ambulatory Visit (INDEPENDENT_AMBULATORY_CARE_PROVIDER_SITE_OTHER): Payer: BLUE CROSS/BLUE SHIELD

## 2015-12-02 DIAGNOSIS — E1065 Type 1 diabetes mellitus with hyperglycemia: Secondary | ICD-10-CM | POA: Diagnosis not present

## 2015-12-02 LAB — URINALYSIS, ROUTINE W REFLEX MICROSCOPIC
BILIRUBIN URINE: NEGATIVE
HGB URINE DIPSTICK: NEGATIVE
KETONES UR: NEGATIVE
NITRITE: POSITIVE — AB
SPECIFIC GRAVITY, URINE: 1.02 (ref 1.000–1.030)
Urine Glucose: NEGATIVE
Urobilinogen, UA: 0.2 (ref 0.0–1.0)
pH: 6 (ref 5.0–8.0)

## 2015-12-02 LAB — GLUCOSE, RANDOM: GLUCOSE: 171 mg/dL — AB (ref 70–99)

## 2015-12-02 LAB — MICROALBUMIN / CREATININE URINE RATIO
CREATININE, U: 171.3 mg/dL
MICROALB UR: 7.5 mg/dL — AB (ref 0.0–1.9)
Microalb Creat Ratio: 4.4 mg/g (ref 0.0–30.0)

## 2015-12-02 LAB — HEMOGLOBIN A1C: HEMOGLOBIN A1C: 9.5 % — AB (ref 4.6–6.5)

## 2015-12-05 ENCOUNTER — Ambulatory Visit (INDEPENDENT_AMBULATORY_CARE_PROVIDER_SITE_OTHER): Payer: BLUE CROSS/BLUE SHIELD | Admitting: Endocrinology

## 2015-12-05 ENCOUNTER — Encounter: Payer: Self-pay | Admitting: Endocrinology

## 2015-12-05 VITALS — BP 132/72 | HR 86 | Temp 98.0°F | Resp 14 | Ht 60.0 in | Wt 113.2 lb

## 2015-12-05 DIAGNOSIS — E1065 Type 1 diabetes mellitus with hyperglycemia: Secondary | ICD-10-CM | POA: Diagnosis not present

## 2015-12-05 DIAGNOSIS — R7989 Other specified abnormal findings of blood chemistry: Secondary | ICD-10-CM | POA: Diagnosis not present

## 2015-12-05 NOTE — Patient Instructions (Addendum)
Must take regular insulin before eating all meals, regardless of mealtime. Try taking 30 minutes before eating  Stay with 15 units before lunch daily unless not working and eating larger meal  May take 20 units R in am unless sugar <100  Check blood sugars on waking up  3  times a week Also check blood sugars about 2 hours after a meal and do this after different meals by rotation  Recommended blood sugar levels on waking up is 90-130 and about 2 hours after meal is 130-160  Please bring your blood sugar monitor to each visit, thank you   Debe tomar insulina regular antes de comer todas las comidas, independientemente de la hora de la comida. Trate de tomar 30 minutos antes de comer  Qudese con 15 unidades antes del almuerzo diario a menos que no trabaje y coma una comida ms grande  Puede tomar 20 unidades R en am a menos que el azcar <100  Compruebe los niveles de azcar en la sangre al despertarse 3 veces por semana [place] compruebe los azcares de la sangre aproximadamente 2 horas despus de [place] comida y haga esto despus de diversas comidas por la rotacin  Los niveles recomendados de azcar en la sangre al despertar es de 90-130 y aproximadamente 2 horas despus de la comida es de 130-160  Por favor, traiga su monitor de azcar en la sangre a cada visita, gracias

## 2015-12-05 NOTE — Progress Notes (Signed)
Patient ID: Gabrielle Waller, female   DOB: 07-Jun-1966, 50 y.o.   MRN: [id number]   Reason for Appointment : Follow up for Type 1 Diabetes  History of Present Illness          Diagnosis: Type 1 diabetes mellitus, date of diagnosis: 2001         Past history: Her blood sugar at diagnosis was 1056 and was started on insulin She is typically having difficulty controlling her diabetes because of cost of her medications and compliance She is also having relatively labile blood sugars Has difficulty understanding instructions for day-to-day management for diabetes and although she claims to be compliant with her insulin doses as prescribed she does not often check her blood sugars as directed and not clear how her diet affects her blood sugars   Recent history:   INSULIN regimen is described as: Toujeo 44 q am; regular insulin 15-20 units at meals  History was obtained with the help of the interpreter today  Her blood sugars have been very difficult to control and A1c has been usually 9% or more She has checked her blood sugar  irregularly and she thinks she lost her meter about a week ago and does not remember exact readings Last A1c was still high at 9.5 She is usually requiring large doses of insulin despite not being obese.  She was using a FreeStyle Insulinx monitor   Current problems and glucose  patterns:   Fasting blood sugars are variable, however she thinks they are not below usually but can be over 300 at times and she does not know why  Not clear what time her blood sugars are the highest, she thinks they may be higher at lunch frequently  She now says that she is taking her lunchtime dose at 1:00 regularly regardless of whether or not she is eating  Usually not taking insulin 30 minutes before eating as directed and sometimes after  If her sugar is high she will take 20 units at lunchtime otherwise 15  She thinks she is now getting low blood sugars about 3 times a  week after lunch but mostly when she is taking 20 units for her lunch  She is fairly active during the day with work, 5 days a week  Does not think her sugars are consistently high after supper but usually not checking at that time   Glucose monitoring:  done less than 1 times a day         Glucometer:  FreeStyle    Blood Glucose readings from recall:  Mean values apply above for all meters except median for One Touch  PRE-MEAL Fasting Lunch Dinner Bedtime Overall  Glucose range: 57-200+   ?  200    Mean/median:        POST-MEAL PC Breakfast PC Lunch PC Dinner  Glucose range:  Low-165   Mean/median:       Self-care:  Meals: 2-3 meals per day.  Quesadilla or oatmeal at 6-7 AM; lunch 12-2 PM and dinner 7  p.m.           Physical activity: exercise: She is active at work  Dietician visit: Most recent:. ?     She has seen diabetes educator several times      Wt Readings from Last 3 Encounters:  12/05/15 113 lb 3.2 oz (51.347 kg)  09/05/15 112 lb (50.803 kg)  07/06/15 114 lb (51.71 kg)     Lab Results  Component Value Date  HGBA1C 9.5* 12/02/2015   HGBA1C 9.5* 08/31/2015   HGBA1C 9.2* 04/18/2015   Lab Results  Component Value Date   MICROALBUR 7.5* 12/02/2015   LDLCALC 86 08/31/2015   CREATININE 0.62 08/31/2015      Lab Results  Component Value Date   MICRALBCREAT 4.4 12/02/2015   MICRALBCREAT 1.9 09/20/2014       Medication List       This list is accurate as of: 12/05/15  9:42 AM.  Always use your most recent med list.               atorvastatin 40 MG tablet  Commonly known as:  LIPITOR  TAKE ONE TABLET BY MOUTH ONCE DAILY     glucose blood test strip  Commonly known as:  FREESTYLE LITE  Use as instructed to check blood sugar 4 times per day dx code E10.65     insulin regular 100 units/mL injection  Commonly known as:  NOVOLIN R RELION  INJECT 24 UNITS SUBCUTANEOUSLY THREE TIMES DAILY BEFORE MEALS     TOUJEO SOLOSTAR 300 UNIT/ML Sopn  Generic  drug:  Insulin Glargine  24 Units.        Allergies:  Allergies  Allergen Reactions  . Penicillins Itching    Past Medical History  Diagnosis Date  . Diabetes mellitus without complication (HCC)     No past surgical history on file.  No family history on file.  Social History:  reports that she has never smoked. She has never used smokeless tobacco. She reports that she does not drink alcohol or use illicit drugs.    Review of Systems:   She has a prior history of hyperthyroidism treated with Tapazole in 2001.   She has had slightly low TSH consistently and normal free T4 without recurrence of hyperthyroidism.    Lab Results  Component Value Date   FREET4 0.72 03/01/2014   FREET4 0.67 09/11/2013   TSH 0.15* 12/30/2014   TSH 0.27* 03/01/2014   TSH 0.22* 09/11/2013     She has had significant hyperlipidemia including high triglycerides. she says she is taking Lipitor at night daily and has been given 40 mg  LDL is improved  Lab Results  Component Value Date   CHOL 154 08/31/2015   HDL 39.50 08/31/2015   LDLCALC 86 08/31/2015   LDLDIRECT 119.9 09/20/2014   TRIG 142.0 08/31/2015   CHOLHDL 4 08/31/2015    She is not having any menstrual cycles    Physical Examination:  BP 132/72 mmHg  Pulse 86  Temp(Src) 98 F (36.7 C)  Resp 14  Ht 5' (1.524 m)  Wt 113 lb 3.2 oz (51.347 kg)  BMI 22.11 kg/m2  SpO2 98%        ASSESSMENT/PLAN:   Diabetes type 1:  See history of present illness for detailed discussion of his current management, blood sugar patterns and problems identified  Her blood sugars are again difficult to regulate with issues with compliance, timing of insulin, dietary variability and activity level. She probably still has some lability of her blood sugars which may be not adequately controlled because of taking regular insulin instead of analog rapid acting insulin She is probably not taking her regular insulin as directed 30 minute before  eating and at variable times at lunch She does not understand the need to take insulin before eating Also not clear why her fasting readings are so variable She does not consistently follow instructions for insulin changes also  She is trying  to increase her insulin at lunchtime when the blood sugars high but because of her activity level she tends to get hypoglycemic Since she is claiming that her highest blood sugars before lunch will need to increase her breakfast dose to 20 units for now She was given a new glucose monitor and will need to review her blood sugar patterns before making any further adjustment especially basal insulin She needs more diabetes education and will have her see nurse educator in one month   THYROID: Will recheck her TSH and   Free T3 on the next visit , do for follow-up  Counseling time on subjects discussed above is over 50% of today's 25 minute visit    Patient Instructions  Must take regular insulin before eating all meals, regardless of mealtime. Try taking 30 minutes before eating  Stay with 15 units before lunch daily unless not working and eating larger meal  May take 20 units R in am unless sugar <100  Check blood sugars on waking up  3  times a week Also check blood sugars about 2 hours after a meal and do this after different meals by rotation  Recommended blood sugar levels on waking up is 90-130 and about 2 hours after meal is 130-160  Please bring your blood sugar monitor to each visit, thank you   Debe tomar insulina regular antes de comer todas las comidas, independientemente de la hora de la comida. Trate de tomar 30 minutos antes de comer  Qudese con 15 unidades antes del almuerzo diario a menos que no trabaje y coma una comida ms grande  Puede tomar 20 unidades R en am a menos que el azcar <100  Compruebe los niveles de azcar en la sangre al despertarse 3 veces por semana [place] compruebe los azcares de la sangre  aproximadamente 2 horas despus de [place] comida y haga esto despus de diversas comidas por la rotacin  Los niveles recomendados de azcar en la sangre al despertar es de 90-130 y aproximadamente 2 horas despus de la comida es de 130-160  Por favor, traiga su monitor de azcar en la sangre a cada visita, gracias         Keshun Berrett 12/05/2015, 9:42 AM      Lab on 12/02/2015  Component Date Value Ref Range Status  . Hgb A1c MFr Bld 12/02/2015 9.5* 4.6 - 6.5 % Final   Glycemic Control Guidelines for People with Diabetes:Non Diabetic:  <6%Goal of Therapy: <7%Additional Action Suggested:  >8%   . Glucose, Bld 12/02/2015 171* 70 - 99 mg/dL Final  . Color, Urine 16/08/9603 YELLOW  Yellow;Lt. Yellow Final  . APPearance 12/02/2015 Sl Cloudy* Clear Final  . Specific Gravity, Urine 12/02/2015 1.020  1.000-1.030 Final  . pH 12/02/2015 6.0  5.0 - 8.0 Final  . Total Protein, Urine 12/02/2015 TRACE* Negative Final  . Urine Glucose 12/02/2015 NEGATIVE  Negative Final  . Ketones, ur 12/02/2015 NEGATIVE  Negative Final  . Bilirubin Urine 12/02/2015 NEGATIVE  Negative Final  . Hgb urine dipstick 12/02/2015 NEGATIVE  Negative Final  . Urobilinogen, UA 12/02/2015 0.2  0.0 - 1.0 Final  . Leukocytes, UA 12/02/2015 MODERATE* Negative Final  . Nitrite 12/02/2015 POSITIVE* Negative Final  . WBC, UA 12/02/2015 7-10/hpf* 0-2/hpf Final  . RBC / HPF 12/02/2015 0-2/hpf  0-2/hpf Final  . Squamous Epithelial / LPF 12/02/2015 Rare(0-4/hpf)  Rare(0-4/hpf) Final  . Bacteria, UA 12/02/2015 Few(10-50/hpf)* None Final  . Microalb, Ur 12/02/2015 7.5* 0.0 - 1.9 mg/dL Final  .  Creatinine,U 12/02/2015 171.3   Final  . Microalb Creat Ratio 12/02/2015 4.4  0.0 - 30.0 mg/g Final

## 2015-12-19 ENCOUNTER — Other Ambulatory Visit: Payer: Self-pay | Admitting: Endocrinology

## 2015-12-29 ENCOUNTER — Emergency Department (HOSPITAL_COMMUNITY)
Admission: EM | Admit: 2015-12-29 | Discharge: 2015-12-29 | Disposition: A | Discharge status: Home / Self Care | Payer: BLUE CROSS/BLUE SHIELD | Source: Home / Self Care | Attending: Family Medicine | Admitting: Family Medicine

## 2015-12-29 ENCOUNTER — Encounter (HOSPITAL_COMMUNITY): Payer: Self-pay | Admitting: Emergency Medicine

## 2015-12-29 DIAGNOSIS — J111 Influenza due to unidentified influenza virus with other respiratory manifestations: Secondary | ICD-10-CM | POA: Diagnosis not present

## 2015-12-29 HISTORY — DX: Calculus of kidney: N20.0

## 2015-12-29 MED ORDER — ONDANSETRON HCL 4 MG PO TABS: 4.0000 mg | ORAL_TABLET | Freq: Three times a day (TID) | ORAL | Status: DC | PRN

## 2015-12-29 NOTE — ED Notes (Signed)
Onset Sunday of symptoms.  Complains of cough, sore throat, bilateral popping ears, fever, nausea and vomiting, no diarrhea.

## 2015-12-29 NOTE — ED Provider Notes (Signed)
CSN: [id number]     Arrival date & time 12/29/15  1301 History   First MD Initiated Contact with Patient 12/29/15 1315     Chief Complaint  Patient presents with  . Fever  . Sore Throat  . Cough   (Consider location/radiation/quality/duration/timing/severity/associated sxs/prior Treatment) HPI  Fever, cough, nasal congestion, body aches, , nausea with occasional emesis. Fever is high as 100.5. Ongoing for days. Motrin 400 mg with some improvement. Symptoms are constant and are no better. Friends at work with similar symptoms. Denies any diarrhea, chest pain, shortness of breath, palpitations, rash, LOC. Denies sore throat  Past Medical History  Diagnosis Date  . Diabetes mellitus without complication (HCC)   . Kidney stones    Past Surgical History  Procedure Laterality Date  . Appendectomy     Family History  Problem Relation Age of Onset  . Diabetes Mother    Social History  Substance Use Topics  . Smoking status: Never Smoker   . Smokeless tobacco: Never Used  . Alcohol Use: No   OB History    No data available     Review of Systems Per HPI with all other pertinent systems negative.   Allergies  Penicillins  Home Medications   Prior to Admission medications   Medication Sig Start Date End Date Taking? Authorizing Provider  acetaminophen (TYLENOL) 325 MG tablet Take 650 mg by mouth every 6 (six) hours as needed.   Yes Historical Provider, MD  atorvastatin (LIPITOR) 40 MG tablet TAKE ONE TABLET BY MOUTH ONCE DAILY 10/01/15   Reather Littler, MD  glucose blood (FREESTYLE LITE) test strip Use as instructed to check blood sugar 4 times per day dx code E10.65 01/05/15   Reather Littler, MD  NOVOLIN R RELION 100 UNIT/ML injection INJECT 24 UNITS SUBCUTANEOUSLY THREE TIMES DAILY BEFORE  MEALS 12/20/15   Reather Littler, MD  ondansetron (ZOFRAN) 4 MG tablet Take 1 tablet (4 mg total) by mouth every 8 (eight) hours as needed for nausea or vomiting. 12/29/15   Ozella Rocks, MD  TOUJEO  SOLOSTAR 300 UNIT/ML SOPN 24 Units.  08/20/15   Historical Provider, MD  TOUJEO SOLOSTAR 300 UNIT/ML SOPN INJECT 50 UNITS SUBCUTANEOUSLY ONCE DAILY 12/20/15   Reather Littler, MD   Meds Ordered and Administered this Visit  Medications - No data to display  BP 146/78 mmHg  Pulse 86  Temp(Src) 98.3 F (36.8 C) (Oral)  Resp 18  SpO2 97% No data found.   Physical Exam Physical Exam  Constitutional: oriented to person, place, and time. appears well-developed and well-nourished. No distress.  HENT:  Head: Normocephalic and atraumatic.  Eyes: EOMI. PERRL.  Neck: Normal range of motion.  Cardiovascular: RRR, no m/r/g, 2+ distal pulses,  Pulmonary/Chest: Effort normal and breath sounds normal. No respiratory distress.  Abdominal: Soft. Bowel sounds are normal. NonTTP, no distension.  Musculoskeletal: Normal range of motion. Non ttp, no effusion.  Neurological: alert and oriented to person, place, and time.  Skin: Skin is warm. No rash noted. non diaphoretic.  Psychiatric: normal mood and affect. behavior is normal. Judgment and thought content normal.    ED Course  Procedures (including critical care time)  Labs Review Labs Reviewed - No data to display  Imaging Review No results found.   Visual Acuity Review  Right Eye Distance:   Left Eye Distance:   Bilateral Distance:    Right Eye Near:   Left Eye Near:    Bilateral Near:  MDM   1. Flu syndrome    Should likely be flu. Outside window for Tamiflu. Motrin, Tylenol, Zofran, fluids, rest.    Ozella Rocks, MD 12/29/15 [phone]

## 2015-12-29 NOTE — Discharge Instructions (Signed)
You likely have the flu This should get better in another 2-3 days Please use ibuprofen  every 6 hours for pain and fever Please use tyelnol  every 8 hours for pain and fever Please use the zofran for nausea

## 2016-01-03 ENCOUNTER — Encounter: Payer: BLUE CROSS/BLUE SHIELD | Admitting: Nutrition

## 2016-02-13 ENCOUNTER — Other Ambulatory Visit: Payer: Self-pay | Admitting: Endocrinology

## 2016-02-22 ENCOUNTER — Other Ambulatory Visit: Payer: Self-pay

## 2016-02-22 DIAGNOSIS — Z1231 Encounter for screening mammogram for malignant neoplasm of breast: Secondary | ICD-10-CM

## 2016-02-28 ENCOUNTER — Other Ambulatory Visit (INDEPENDENT_AMBULATORY_CARE_PROVIDER_SITE_OTHER): Payer: BLUE CROSS/BLUE SHIELD

## 2016-02-28 DIAGNOSIS — E1065 Type 1 diabetes mellitus with hyperglycemia: Secondary | ICD-10-CM

## 2016-02-28 DIAGNOSIS — R7989 Other specified abnormal findings of blood chemistry: Secondary | ICD-10-CM | POA: Diagnosis not present

## 2016-02-28 LAB — BASIC METABOLIC PANEL
BUN: 10 mg/dL (ref 6–23)
CALCIUM: 9.5 mg/dL (ref 8.4–10.5)
CO2: 31 meq/L (ref 19–32)
CREATININE: 0.6 mg/dL (ref 0.40–1.20)
Chloride: 106 mEq/L (ref 96–112)
GFR: 112.63 mL/min (ref >60.00)
GLUCOSE: 143 mg/dL — AB (ref 70–99)
Potassium: 3.9 mEq/L (ref 3.5–5.1)
SODIUM: 142 meq/L (ref 135–145)

## 2016-02-28 LAB — TSH: TSH: 0.2 u[IU]/mL — ABNORMAL LOW (ref 0.35–4.50)

## 2016-02-28 LAB — HEMOGLOBIN A1C: Hgb A1c MFr Bld: 9.8 % — ABNORMAL HIGH (ref 4.6–6.5)

## 2016-02-28 LAB — T3, FREE: T3, Free: 3.5 pg/mL (ref 2.3–4.2)

## 2016-03-01 ENCOUNTER — Encounter: Payer: Self-pay | Admitting: Endocrinology

## 2016-03-01 ENCOUNTER — Ambulatory Visit (INDEPENDENT_AMBULATORY_CARE_PROVIDER_SITE_OTHER): Payer: BLUE CROSS/BLUE SHIELD | Admitting: Endocrinology

## 2016-03-01 VITALS — BP 126/80 | HR 92 | Temp 97.9°F | Resp 14 | Ht 60.0 in | Wt 112.6 lb

## 2016-03-01 DIAGNOSIS — E1065 Type 1 diabetes mellitus with hyperglycemia: Secondary | ICD-10-CM

## 2016-03-01 DIAGNOSIS — N951 Menopausal and female climacteric states: Secondary | ICD-10-CM

## 2016-03-01 NOTE — Patient Instructions (Addendum)
20 units Regular for lighter meals  Check blood sugars on waking up 3-4  times a week Also check blood sugars about 2 hours after a meal and do this after different meals by rotation  Recommended blood sugar levels on waking up is 90-130 and about 2 hours after meal is 130-160  Please bring your blood sugar monitor to each visit, thank you  20 unidades Regular para comidas ms ligeras  Compruebe los niveles de azcar en la sangre al despertar 3-4 veces a la semana [place] compruebe los azcares de la sangre aproximadamente 2 horas despus de [place] y haga esto despus de comidas diferentes por la rotacin  Los niveles recomendados de azcar en la sangre al despertar es de 90-130 y aproximadamente 2 horas despus de la comida es de 130-160  Por favor, traiga su monitor de azcar en la sangre a cada visita, gracias

## 2016-03-01 NOTE — Progress Notes (Signed)
Patient ID: Gabrielle Waller, female   DOB: Oct 15, 1966, 50 y.o.   MRN: [id number]   Reason for Appointment : Follow up for Type 1 Diabetes  History of Present Illness          Diagnosis: Type 1 diabetes mellitus, date of diagnosis: 2001         Past history: Her blood sugar at diagnosis was 1056 and was started on insulin She is typically having difficulty controlling her diabetes because of cost of her medications and compliance She is also having relatively labile blood sugars Has difficulty understanding instructions for day-to-day management for diabetes and although she claims to be compliant with her insulin doses as prescribed she does not often check her blood sugars as directed and not clear how her diet affects her blood sugars   Recent history:   INSULIN regimen is described as: Toujeo 44 q am; regular insulin 24 units at meals  History was obtained with the help of the interpreter today  Her blood sugars have been very difficult to control and A1c has been usually 9% or more, slightly higher at 9.8 now She is usually requiring large doses of insulin despite not being obese.  She was using a FreeStyle Insulinx monitor   Current problems and glucose  patterns:   Fasting blood sugars are variable, however there are not consistently high and has had nocturnal hypoglycemia only once  She is checking her blood sugars sporadically in the afternoon, mostly before supper   Not clear why she has increased her insulin at mealtimes on her own and does not adjusted based on what she is eating  Usually eating at [employer] with braided chicken sandwich and french fries  Not clear if her sugars are consistently high after supper but usually not checking at that time   Last night had a low sugar late night/early morning and apparently had a smaller supper  Glucose monitoring:  done less than 1 times a day         Glucometer:  FreeStyle    Blood Glucose readings from   Mean  values apply above for all meters except median for One Touch  PRE-MEAL Fasting Lunch Dinner Bedtime Overall  Glucose range: 69-230  145-351    Mean/median: 155   235   178    Self-care:  Meals: 2-3 meals per day.  Bfst 10 AM; lunch  2 PM and dinner 8  p.m.           Physical activity: exercise: She is Not doing any specific activity Usually less active at work now  [employer] visit: Most recent:. ?     She has seen diabetes educator several times      Wt Readings from Last 3 Encounters:  03/01/16 112 lb 9.6 oz (51.075 kg)  12/05/15 113 lb 3.2 oz (51.347 kg)  09/05/15 112 lb (50.803 kg)     Lab Results  Component Value Date   HGBA1C 9.8* 02/28/2016   HGBA1C 9.5* 12/02/2015   HGBA1C 9.5* 08/31/2015   Lab Results  Component Value Date   MICROALBUR 7.5* 12/02/2015   LDLCALC 86 08/31/2015   CREATININE 0.60 02/28/2016      Lab Results  Component Value Date   MICRALBCREAT 4.4 12/02/2015   MICRALBCREAT 1.9 09/20/2014       Medication List       This list is accurate as of: 03/01/16  4:59 PM.  Always use your most recent med list.  acetaminophen 325 MG tablet  Commonly known as:  TYLENOL  Take 650 mg by mouth every 6 (six) hours as needed.     atorvastatin 40 MG tablet  Commonly known as:  LIPITOR  TAKE ONE TABLET BY MOUTH ONCE DAILY     glucose blood test strip  Commonly known as:  FREESTYLE LITE  Use as instructed to check blood sugar 4 times per day dx code E10.65     NOVOLIN R RELION 100 units/mL injection  Generic drug:  insulin regular  INJECT 24 UNITS SUBCUTANEOUSLY THREE TIMES DAILY BEFORE  MEALS     ondansetron 4 MG tablet  Commonly known as:  ZOFRAN  Take 1 tablet (4 mg total) by mouth every 8 (eight) hours as needed for nausea or vomiting.     TOUJEO SOLOSTAR 300 UNIT/ML Sopn  Generic drug:  Insulin Glargine  44 Units.        Allergies:  Allergies  Allergen Reactions  . Penicillins Itching    Past Medical History    Diagnosis Date  . Diabetes mellitus without complication (HCC)   . Kidney stones     Past Surgical History  Procedure Laterality Date  . Appendectomy      Family History  Problem Relation Age of Onset  . Diabetes Mother     Social History:  reports that she has never smoked. She has never used smokeless tobacco. She reports that she does not drink alcohol or use illicit drugs.    Review of Systems:   She has a prior history of hyperthyroidism treated with Tapazole in 2001.   She has had slightly low TSH consistently and normal free T4 and T3 without recurrence of hyperthyroidism.    Lab Results  Component Value Date   FREET4 0.72 03/01/2014   FREET4 0.67 09/11/2013   TSH 0.20* 02/28/2016   TSH 0.15* 12/30/2014   TSH 0.27* 03/01/2014     She has had significant hyperlipidemia including high triglycerides. she is taking Lipitor at night daily and has been given 40 mg  LDL is As follows  Lab Results  Component Value Date   CHOL 154 08/31/2015   HDL 39.50 08/31/2015   LDLCALC 86 08/31/2015   LDLDIRECT 119.9 09/20/2014   TRIG 142.0 08/31/2015   CHOLHDL 4 08/31/2015    She is not having any menstrual cycles But is complaining of hot flashes and sweating spells but has not talked to her PCP, still has IUD   Physical Examination:  BP 126/80 mmHg  Pulse 92  Temp(Src) 97.9 F (36.6 C)  Resp 14  Ht 5' (1.524 m)  Wt 112 lb 9.6 oz (51.075 kg)  BMI 21.99 kg/m2  SpO2 98%        ASSESSMENT/PLAN:   Diabetes type 1:  See history of present illness for detailed discussion of his current management, blood sugar patterns and problems identified  Her blood sugars are again difficult to regulate with issues with compliance, high-fat meals at lunch, using regular insulin with variable timing as well as inherent variability She is eating at fast food places at lunchtime and this is causing high readings in the afternoon Not checking readings after breakfast or supper  and encouraged her to do those.  She does need to adjust her mealtime insulin based on type of meal and carbohydrates and probably will need 20 units only for lighter meals Consider continuous glucose monitoring if not eating adequate information on her next visit from her home readings  THYROID:  Minimal persistent decrease in TSH without Hyperthyroidism  Early menopause: She will talk to her PCP about hormone patches for symptomatic control  Counseling time on subjects discussed above is over 50% of today's 25 minute visit    Patient Instructions  20 units Regular for lighter meals  Check blood sugars on waking up 3-4  times a week Also check blood sugars about 2 hours after a meal and do this after different meals by rotation  Recommended blood sugar levels on waking up is 90-130 and about 2 hours after meal is 130-160  Please bring your blood sugar monitor to each visit, thank you  20 unidades Regular para comidas ms ligeras  Compruebe los niveles de azcar en la sangre al despertar 3-4 veces a la semana Tambin compruebe los azcares de la sangre aproximadamente 2 horas despus de una comida y haga esto despus de comidas diferentes por la rotacin  Los niveles recomendados de azcar en la sangre al despertar es de 90-130 y aproximadamente 2 horas despus de la comida es de 130-160  Por favor, traiga su monitor de azcar en la sangre a cada visita, Johny Blamer 03/01/2016, 4:59 PM      Lab on 02/28/2016  Component Date Value Ref Range Status  . Hgb A1c MFr Bld 02/28/2016 9.8* 4.6 - 6.5 % Final   Glycemic Control Guidelines for People with Diabetes:Non Diabetic:  <6%Goal of Therapy: <7%Additional Action Suggested:  >8%   . T3, Free 02/28/2016 3.5  2.3 - 4.2 pg/mL Final  . Sodium 02/28/2016 142  135 - 145 mEq/L Final  . Potassium 02/28/2016 3.9  3.5 - 5.1 mEq/L Final  . Chloride 02/28/2016 106  96 - 112 mEq/L Final  . CO2 02/28/2016 31  19 - 32  mEq/L Final  . Glucose, Bld 02/28/2016 143* 70 - 99 mg/dL Final  . BUN 16/08/9603 10  6 - 23 mg/dL Final  . Creatinine, Ser 02/28/2016 0.60  0.40 - 1.20 mg/dL Final  . Calcium 54/07/8118 9.5  8.4 - 10.5 mg/dL Final  . GFR 14/78/2956 112.63  >60.00 mL/min Final  . TSH 02/28/2016 0.20* 0.35 - 4.50 uIU/mL Final

## 2016-03-02 ENCOUNTER — Ambulatory Visit: Payer: BLUE CROSS/BLUE SHIELD | Admitting: Endocrinology

## 2016-03-08 ENCOUNTER — Encounter: Payer: Self-pay | Admitting: Family

## 2016-03-08 ENCOUNTER — Ambulatory Visit (INDEPENDENT_AMBULATORY_CARE_PROVIDER_SITE_OTHER): Payer: BLUE CROSS/BLUE SHIELD | Admitting: Family

## 2016-03-08 VITALS — BP 168/92 | HR 105 | Temp 98.0°F | Resp 16 | Ht 60.0 in | Wt 113.8 lb

## 2016-03-08 DIAGNOSIS — R030 Elevated blood-pressure reading, without diagnosis of hypertension: Secondary | ICD-10-CM

## 2016-03-08 DIAGNOSIS — Z1231 Encounter for screening mammogram for malignant neoplasm of breast: Secondary | ICD-10-CM

## 2016-03-08 DIAGNOSIS — E109 Type 1 diabetes mellitus without complications: Secondary | ICD-10-CM

## 2016-03-08 DIAGNOSIS — J309 Allergic rhinitis, unspecified: Secondary | ICD-10-CM | POA: Diagnosis not present

## 2016-03-08 DIAGNOSIS — Z0000 Encounter for general adult medical examination without abnormal findings: Secondary | ICD-10-CM | POA: Insufficient documentation

## 2016-03-08 DIAGNOSIS — Z23 Encounter for immunization: Secondary | ICD-10-CM | POA: Diagnosis not present

## 2016-03-08 DIAGNOSIS — E785 Hyperlipidemia, unspecified: Secondary | ICD-10-CM | POA: Diagnosis not present

## 2016-03-08 DIAGNOSIS — E1065 Type 1 diabetes mellitus with hyperglycemia: Secondary | ICD-10-CM

## 2016-03-08 DIAGNOSIS — IMO0001 Reserved for inherently not codable concepts without codable children: Secondary | ICD-10-CM | POA: Insufficient documentation

## 2016-03-08 MED ORDER — LORATADINE 10 MG PO TABS: 10.0000 mg | ORAL_TABLET | Freq: Every day | ORAL | Status: DC

## 2016-03-08 NOTE — Progress Notes (Signed)
Pre visit review using our clinic review tool, if applicable. No additional management support is needed unless otherwise documented below in the visit note. 

## 2016-03-08 NOTE — Assessment & Plan Note (Signed)
Symptoms and exam consistent with allergic rhinitis. Start Claritin. May also consider nasal corticosteroid as needed for symptom relief. Follow-up if symptoms worsen or do not improve.

## 2016-03-08 NOTE — Assessment & Plan Note (Signed)
Type 1 diabetes remains uncontrolled with A1c of 9.5. Currently managed by endocrinology. Pneumovax updated today. Continue current dosage of Tujeo and Novolin R. Follow-up and changes per endocrinology. Encouraged to complete diabetic eye exam when due. Maintained on atorvastatin for CAD risk reduction.

## 2016-03-08 NOTE — Patient Instructions (Addendum)
Thank you for choosing [facility].  Summary/Instructions:  Please start taking the Claratin for your allergies.   May also try Flonase, Nasacort, Rhinocort nasal sprays as needed for symptom relief.  Your prescription(s) have been submitted to your pharmacy or been printed and provided for you. Please take as directed and contact our office if you believe you are having problem(s) with the medication(s) or have any questions.  If your symptoms worsen or fail to improve, please contact our office for further instruction, or in case of emergency go directly to the emergency room at the closest medical facility.

## 2016-03-08 NOTE — Assessment & Plan Note (Addendum)
Prevnar and tetanus updated today. All other immunizations are up-to-date per recommendations. Scheduled for mammogram on 03/13/16. Diabetic foot exam completed today. Diabetic eye exam will be due in June.

## 2016-03-08 NOTE — Assessment & Plan Note (Signed)
Hyperlipidemia appears adequately controlled with current regimen of atorvastatin with no myalgias. Continue current dosage of atorvastatin.

## 2016-03-08 NOTE — Assessment & Plan Note (Signed)
Blood pressure elevated above goal 140/90 today. Previous blood pressure was within normal ranges. Nurse visit in 3 weeks to recheck blood pressure. If remains elevated start lisinopril for CAD risk reduction and hypertension.

## 2016-03-08 NOTE — Progress Notes (Signed)
Subjective:    Patient ID: Gabrielle Waller, female    DOB: [birth year], 50 y.o.   MRN: [id number]  Chief Complaint  Patient presents with  . Establish Care    referral for mammogram, states that she feels numbness on the left side of her mouth and sometimes pain when she eats, has some congestion that has been going on x3 day    HPI:  Gabrielle Waller is a 50 y.o. female who  has a past medical history of Diabetes mellitus without complication (HCC) and Kidney stones. and presents today for an office visit to establish care.   1.) Congestion - This is a new problem. Associated symptom of congestion and sore throat have been going on for about 3 days. Denies fever. No modifying factors that make it better or worse. Aggravated by going outside. Endorses some watery eyes. Timing of symptoms is worse in the morning.   2.) Health Maintenance -   Health Maintenance  Topic Date Due  . PNEUMOCOCCAL POLYSACCHARIDE VACCINE (1) 07/05/1968  . FOOT EXAM  07/05/1976  . HIV Screening  07/05/1981  . TETANUS/TDAP  12/12/2010  . OPHTHALMOLOGY EXAM  04/05/2016  . INFLUENZA VACCINE  06/05/2016  . HEMOGLOBIN A1C  08/29/2016  . URINE MICROALBUMIN  12/01/2016  . PAP SMEAR  02/18/2019    3.) Hyperlipidemia - Hyperlipidemia currently managed with atorvastatin. Reports taking the medication as prescribed and denies adverse side effects. No myalgias. Most recent cholesterol completed in October 2016.  Lab Results  Component Value Date   CHOL 154 08/31/2015   HDL 39.50 08/31/2015   LDLCALC 86 08/31/2015   LDLDIRECT 119.9 09/20/2014   TRIG 142.0 08/31/2015   CHOLHDL 4 08/31/2015    4.) Type 1 Diabetes - type 1 diabetes currently managed with Tujeo and Novolin R. Reports taking her medications with some issues secondary to cost and compliance. Currently managed through endocrinology. [place] adverse side effects or hypoglycemic readings.   Immunization History  Administered Date(s) Administered  .  Influenza,inj,Quad PF,36+ Mos 09/05/2015  . Tdap 03/08/2016    Allergies  Allergen Reactions  . Penicillins Itching     Outpatient Prescriptions Prior to Visit  Medication Sig Dispense Refill  . acetaminophen (TYLENOL) 325 MG tablet Take 650 mg by mouth every 6 (six) hours as needed.    Marland Kitchen atorvastatin (LIPITOR) 40 MG tablet TAKE ONE TABLET BY MOUTH ONCE DAILY 30 tablet 1  . glucose blood (FREESTYLE LITE) test strip Use as instructed to check blood sugar 4 times per day dx code E10.65 125 each 3  . NOVOLIN R RELION 100 UNIT/ML injection INJECT 24 UNITS SUBCUTANEOUSLY THREE TIMES DAILY BEFORE  MEALS 20 mL 3  . TOUJEO SOLOSTAR 300 UNIT/ML SOPN 44 Units.   3  . ondansetron (ZOFRAN) 4 MG tablet Take 1 tablet (4 mg total) by mouth every 8 (eight) hours as needed for nausea or vomiting. 30 tablet 0   No facility-administered medications prior to visit.     Past Medical History  Diagnosis Date  . Diabetes mellitus without complication (HCC)   . Kidney stones      Past Surgical History  Procedure Laterality Date  . Appendectomy       Family History  Problem Relation Age of Onset  . Diabetes Mother      Social History   Social History  . Marital Status: Married    Spouse Name: N/A  . Number of Children: 4  . Years of Education: 12   Occupational  History  . Cleaning    Social History Main Topics  . Smoking status: Never Smoker   . Smokeless tobacco: Never Used  . Alcohol Use: No  . Drug Use: No  . Sexual Activity: Not on file   Other Topics Concern  . Not on file   Social History Narrative   Fun: walks, listening to music   Denies abuse and feels safe at home.        Review of Systems  Constitutional: Negative for fever and chills.  HENT: Positive for congestion, rhinorrhea and sore throat.   Eyes:       Positive for eye watering  Respiratory: Negative for cough.   Cardiovascular: Negative for chest pain, palpitations and leg swelling.  Neurological:  Negative for headaches.      Objective:    BP 168/92 mmHg  Pulse 105  Temp(Src) 98 F (36.7 C) (Oral)  Resp 16  Ht 5' (1.524 m)  Wt 113 lb 12.8 oz (51.619 kg)  BMI 22.22 kg/m2  SpO2 97% Nursing note and vital signs reviewed.   Physical Exam  Constitutional: She is oriented to person, place, and time. She appears well-developed and well-nourished. No distress.  HENT:  Right Ear: Hearing, tympanic membrane, external ear and ear canal normal.  Left Ear: Hearing, tympanic membrane, external ear and ear canal normal.  Nose: Nose normal.  Mouth/Throat: Uvula is midline, oropharynx is clear and moist and mucous membranes are normal.  Cardiovascular: Normal rate, regular rhythm, normal heart sounds and intact distal pulses.   Pulmonary/Chest: Effort normal and breath sounds normal.  Neurological: She is alert and oriented to person, place, and time.  Skin: Skin is warm and dry.  Psychiatric: She has a normal mood and affect. Her behavior is normal. Judgment and thought content normal.       Assessment & Plan:   Problem List Items Addressed This Visit      Respiratory   Allergic rhinitis - Primary    Symptoms and exam consistent with allergic rhinitis. Start Claritin. May also consider nasal corticosteroid as needed for symptom relief. Follow-up if symptoms worsen or do not improve.      Relevant Medications   loratadine (CLARITIN) 10 MG tablet     Endocrine   Uncontrolled type 1 diabetes mellitus (HCC)    Type 1 diabetes remains uncontrolled with A1c of 9.5. Currently managed by endocrinology. Pneumovax updated today. Continue current dosage of Tujeo and Novolin R. Follow-up and changes per endocrinology. Encouraged to complete diabetic eye exam when due. Maintained on atorvastatin for CAD risk reduction.      Relevant Medications   aspirin 325 MG EC tablet     Other   Hyperlipidemia    Hyperlipidemia appears adequately controlled with current regimen of atorvastatin  with no myalgias. Continue current dosage of atorvastatin.      Relevant Medications   aspirin 325 MG EC tablet   Health care maintenance    Prevnar and tetanus updated today. Scheduled for mammogram on 03/13/16.      Elevated blood pressure    Blood pressure elevated above goal 140/90 today. Previous blood pressure was within normal ranges. Nurse visit in 3 weeks to recheck blood pressure. If remains elevated start lisinopril for CAD risk reduction and hypertension.       Other Visit Diagnoses    Encounter for screening mammogram for breast cancer        Need for diphtheria-tetanus-pertussis (Tdap) vaccine, adult/adolescent  Relevant Orders    Tdap vaccine greater than or equal to 7yo IM (Completed)    Need for 23-polyvalent pneumococcal polysaccharide vaccine        Relevant Orders    Pneumococcal polysaccharide vaccine 23-valent greater than or equal to 2yo subcutaneous/IM        I have discontinued Ms. Woehrle's ondansetron. I am also having her start on loratadine. Additionally, I am having her maintain her glucose blood, TOUJEO SOLOSTAR, NOVOLIN R RELION, acetaminophen, atorvastatin, Vitamin D (Ergocalciferol), and aspirin.   Meds ordered this encounter  Medications  . Vitamin D, Ergocalciferol, (DRISDOL) 50000 units CAPS capsule    Sig: Take 50,000 Units by mouth every 7 (seven) days.  Marland Kitchen. aspirin 325 MG EC tablet    Sig: Take 325 mg by mouth daily.  Marland Kitchen. loratadine (CLARITIN) 10 MG tablet    Sig: Take 1 tablet (10 mg total) by mouth daily.    Dispense:  90 tablet    Refill:  0    Order Specific Question:  Supervising Provider    Answer:  Hillard DankerRAWFORD, ELIZABETH A [[id number]]     Follow-up: Return in about 1 month (around 04/08/2016) for Nurse visit for BP.  Jeanine Luzalone, Gregory, FNP

## 2016-03-13 ENCOUNTER — Ambulatory Visit
Admission: RE | Admit: 2016-03-13 | Discharge: 2016-03-13 | Disposition: A | Discharge status: Home / Self Care | Payer: BLUE CROSS/BLUE SHIELD | Source: Ambulatory Visit

## 2016-03-13 DIAGNOSIS — Z1231 Encounter for screening mammogram for malignant neoplasm of breast: Secondary | ICD-10-CM

## 2016-04-17 ENCOUNTER — Other Ambulatory Visit: Payer: Self-pay | Admitting: Endocrinology

## 2016-04-24 ENCOUNTER — Other Ambulatory Visit: Payer: Self-pay | Admitting: Endocrinology

## 2016-04-24 NOTE — Telephone Encounter (Signed)
PT [facility]

## 2016-05-17 ENCOUNTER — Other Ambulatory Visit: Payer: Self-pay | Admitting: Endocrinology

## 2016-05-22 ENCOUNTER — Other Ambulatory Visit (INDEPENDENT_AMBULATORY_CARE_PROVIDER_SITE_OTHER): Payer: BLUE CROSS/BLUE SHIELD

## 2016-05-22 DIAGNOSIS — E1065 Type 1 diabetes mellitus with hyperglycemia: Secondary | ICD-10-CM | POA: Diagnosis not present

## 2016-05-22 LAB — HEMOGLOBIN A1C: HEMOGLOBIN A1C: 9.7 % — AB (ref 4.6–6.5)

## 2016-05-22 LAB — GLUCOSE, RANDOM: Glucose, Bld: 111 mg/dL — ABNORMAL HIGH (ref 70–99)

## 2016-05-23 ENCOUNTER — Other Ambulatory Visit: Payer: BLUE CROSS/BLUE SHIELD

## 2016-05-30 ENCOUNTER — Encounter: Payer: Self-pay | Admitting: Endocrinology

## 2016-05-30 ENCOUNTER — Ambulatory Visit (INDEPENDENT_AMBULATORY_CARE_PROVIDER_SITE_OTHER): Payer: BLUE CROSS/BLUE SHIELD | Admitting: Endocrinology

## 2016-05-30 VITALS — BP 142/78 | HR 95 | Wt 113.0 lb

## 2016-05-30 DIAGNOSIS — E1065 Type 1 diabetes mellitus with hyperglycemia: Secondary | ICD-10-CM | POA: Diagnosis not present

## 2016-05-30 NOTE — Progress Notes (Signed)
Patient ID: Gabrielle Waller, female   DOB: Apr 17, 1966, 50 y.o.   MRN: [id number]   Reason for Appointment : Follow up for Type 1 Diabetes  History of Present Illness          Diagnosis: Type 1 diabetes mellitus, date of diagnosis: 2001         Past history: Her blood sugar at diagnosis was 1056 and was started on insulin She is typically having difficulty controlling her diabetes because of cost of her medications and compliance She is also having relatively labile blood sugars Has difficulty understanding instructions for day-to-day management for diabetes and although she claims to be compliant with her insulin doses as prescribed she does not often check her blood sugars as directed and not clear how her diet affects her blood sugars   Recent history:   INSULIN regimen is described as: Toujeo 44 q am; regular insulin 15-20 units at meals  History was obtained with the help of the interpreter today  Her blood sugars have been very difficult to control and A1c has been usually 9% or more She is usually requiring large doses of insulin despite not being obese.  She was using a FreeStyle Insulinx monitor   Current problems and glucose  patterns:   Fasting blood sugars are variable, however she is checking very infrequently as are higher on 2 of her 4 readings  She appears to have inconsistent readings before lunch  Blood sugars at suppertime  have been checked twice and these are high  4 out of the 5 readings at bedtime are significantly high  She is not taking the doses of regular insulin as directed and only 15-20 units instead of 20-24  She will take 15 units at bedtime when blood sugars are high and she is having a fruit snack like a small amount of papaya  No significant hypoglycemia, only once at about 10 AM Recently she thinks her blood sugars higher because of stress   Glucose monitoring:  done less than 1 times a day         Glucometer:  FreeStyle    Blood  Glucose readings from   Mean values apply above for all meters except median for One Touch  PRE-MEAL Fasting Lunch Dinner Bedtime Overall  Glucose range: 52-254 126-279  257-320  169-318    Mean/median:    280  235    Self-care:  Meals: 2-3 meals per day.  Bfst 10 AM; lunch  2 PM and dinner 5  p.m.           Physical activity: exercise: She is Not doing any specific activity Usually less active at work now  [employer] visit: Most recent:. ?     She has seen diabetes educator several times      Wt Readings from Last 3 Encounters:  05/30/16 113 lb (51.3 kg)  03/08/16 113 lb 12.8 oz (51.6 kg)  03/01/16 112 lb 9.6 oz (51.1 kg)     Lab Results  Component Value Date   HGBA1C 9.7 (H) 05/22/2016   HGBA1C 9.8 (H) 02/28/2016   HGBA1C 9.5 (H) 12/02/2015   Lab Results  Component Value Date   MICROALBUR 7.5 (H) 12/02/2015   LDLCALC 86 08/31/2015   CREATININE 0.60 02/28/2016      Lab Results  Component Value Date   MICRALBCREAT 4.4 12/02/2015   MICRALBCREAT 1.9 09/20/2014       Medication List       Accurate as of  05/30/16  8:46 PM. Always use your most recent med list.          acetaminophen 325 MG tablet Commonly known as:  TYLENOL Take 650 mg by mouth every 6 (six) hours as needed.   aspirin 325 MG EC tablet Take 325 mg by mouth daily.   atorvastatin 40 MG tablet Commonly known as:  LIPITOR TAKE ONE TABLET BY MOUTH ONCE DAILY   glucose blood test strip Commonly known as:  FREESTYLE LITE Use as instructed to check blood sugar 4 times per day dx code E10.65   loratadine 10 MG tablet Commonly known as:  CLARITIN Take 1 tablet (10 mg total) by mouth daily.   NOVOLIN R RELION 100 units/mL injection Generic drug:  insulin regular INJECT 24 UNITS SUBCUTANEOUSLY THREE TIMES DAILY BEFORE  MEALS   TOUJEO SOLOSTAR 300 UNIT/ML Sopn Generic drug:  Insulin Glargine 44 Units.   TOUJEO SOLOSTAR 300 UNIT/ML Sopn Generic drug:  Insulin Glargine INJECT 50 UNITS  SUBCUTANEOUSLY ONCE DAILY   Vitamin D (Ergocalciferol) 50000 units Caps capsule Commonly known as:  DRISDOL Take 50,000 Units by mouth every 7 (seven) days.       Allergies:  Allergies  Allergen Reactions  . Penicillins Itching    Past Medical History:  Diagnosis Date  . Diabetes mellitus without complication (HCC)   . Kidney stones     Past Surgical History:  Procedure Laterality Date  . APPENDECTOMY      Family History  Problem Relation Age of Onset  . Diabetes Mother     Social History:  reports that she has never smoked. She has never used smokeless tobacco. She reports that she does not drink alcohol or use drugs.    Review of Systems:   She has a prior history of hyperthyroidism treated with Tapazole in 2001.   She has had slightly low TSH consistently and normal free T4 and T3 without recurrence of hyperthyroidism.    Lab Results  Component Value Date   FREET4 0.72 03/01/2014   FREET4 0.67 09/11/2013   TSH 0.20 (L) 02/28/2016   TSH 0.15 (L) 12/30/2014   TSH 0.27 (L) 03/01/2014     She has had significant hyperlipidemia including high triglycerides. she is taking Lipitor at night daily and has been given 40 mg  LDL is As follows  Lab Results  Component Value Date   CHOL 154 08/31/2015   HDL 39.50 08/31/2015   LDLCALC 86 08/31/2015   LDLDIRECT 119.9 09/20/2014   TRIG 142.0 08/31/2015   CHOLHDL 4 08/31/2015    She is not having any menstrual cycles   She is complaining of feeling tired and sleepy now   Physical Examination:  BP (!) 142/78 (BP Location: Left Arm, Patient Position: Sitting)   Pulse 95   Wt 113 lb (51.3 kg)   SpO2 96%   BMI 22.07 kg/m         ASSESSMENT/PLAN:   Diabetes type 1:  See history of present illness for detailed discussion of his current management, blood sugar patterns and problems identified  Her blood sugars are again difficult to regulate with issues with compliance, Lack of glucose monitoring,  occasionally high-fat meals and not taking the full dose of insulin at mealtimes as directed before Not clear why she is not consistent with her compliance even though she is usually explained how to do everything clearly and also given translation in Spanish  Recommendations made today:  At least for the 2 weeks prior to  her follow-up visit she needs to check blood sugars 4 times a day as directed  Start taking at least 20 units at mealtimes of the regular insulin and 24 units for higher carbohydrate or high-fat meals  Avoid insulin at bedtime blood sugars are at least 200  No change in Toujeo as yet  She was given the option of changing her glucose monitor to something less expensive but she does not want to do that  Start regular walking  Consider consultation with diabetes educator again Counseling time on subjects discussed above is over 50% of today's 25 minute visit    Patient Instructions  Regular insulin: Take this before meals, usually 30 minutes before eating  If eating smaller amounts of starchy foods or smaller meal take 20 units of Regular Insulin otherwise 24 units.  May take 10-15 units at bedtime only if blood sugar is over 200  At least for 2 weeks prior to your visit check your blood sugar before every meal and at bedtime  Start walking regularly  Avoid all fried foods and high fat meals   Insulina regular: Tome esto antes de las comidas, generalmente 30 minutos antes de comer  Si comer cantidades ms pequeas de alimentos con almidn o comida ms pequea tomar 20 unidades de insulina regular de lo contrario 24 unidades.  Puede tomar 10-15 unidades a la hora de acostarse slo si el nivel de Psychologist, counselling sangre es superior a 200  Por lo menos durante 2 semanas antes de su visita revise su nivel de azcar en la sangre antes de cada comida ya la hora de acostarse  Comience a caminar regularmente  Evite todos los alimentos fritos y comidas ricas en  grasas      Haddie Bruhl 05/30/2016, 8:46 PM      No visits with results within 1 Week(s) from this visit.  Latest known visit with results is:  Lab on 05/22/2016  Component Date Value Ref Range Status  . Hgb A1c MFr Bld 05/22/2016 9.7* 4.6 - 6.5 % Final  . Glucose, Bld 05/22/2016 111* 70 - 99 mg/dL Final

## 2016-05-30 NOTE — Patient Instructions (Signed)
Regular insulin: Take this before meals, usually 30 minutes before eating  If eating smaller amounts of starchy foods or smaller meal take 20 units of Regular Insulin otherwise 24 units.  May take 10-15 units at bedtime only if blood sugar is over 200  At least for 2 weeks prior to your visit check your blood sugar before every meal and at bedtime  Start walking regularly  Avoid all fried foods and high fat meals   Insulina regular: Tome esto antes de las comidas, generalmente 30 minutos antes de comer  Si comer cantidades ms pequeas de alimentos con almidn o comida ms pequea tomar 20 unidades de insulina regular de lo contrario 24 unidades.  Puede tomar 10-15 unidades a la hora de acostarse slo si el nivel de Psychologist, counselling sangre es superior a 200  Por lo menos durante 2 semanas antes de su visita revise su nivel de azcar en la sangre antes de cada comida ya la hora de acostarse  Comience a caminar regularmente  Evite todos los alimentos fritos y comidas ricas en grasas

## 2016-06-01 ENCOUNTER — Other Ambulatory Visit: Payer: Self-pay | Admitting: Endocrinology

## 2016-06-27 ENCOUNTER — Other Ambulatory Visit: Payer: Self-pay | Admitting: Endocrinology

## 2016-07-10 ENCOUNTER — Ambulatory Visit (INDEPENDENT_AMBULATORY_CARE_PROVIDER_SITE_OTHER): Payer: BLUE CROSS/BLUE SHIELD | Admitting: Endocrinology

## 2016-07-10 VITALS — BP 126/84 | HR 105 | Temp 98.0°F | Resp 14 | Ht 60.0 in | Wt 113.4 lb

## 2016-07-10 DIAGNOSIS — E1065 Type 1 diabetes mellitus with hyperglycemia: Secondary | ICD-10-CM

## 2016-07-10 DIAGNOSIS — R7989 Other specified abnormal findings of blood chemistry: Secondary | ICD-10-CM | POA: Diagnosis not present

## 2016-07-10 DIAGNOSIS — E785 Hyperlipidemia, unspecified: Secondary | ICD-10-CM

## 2016-07-10 NOTE — Patient Instructions (Addendum)
Take 20 units Regular at lunch when at work or eating smaller meals  TOUJEO 46 UNITS  Check blood sugars on waking up    Also check blood sugars about 2 hours after a meal and do this after different meals by rotation  Recommended blood sugar levels on waking up is 90-130 and about 2 hours after meal is 130-160  Please bring your blood sugar monitor to each visit, thank you   Gabrielle Waller 20 unidades regulares en el almuerzo cuando en el trabajo o comer comidas ms pequeas  TOUJEO 46 UNIDADES  Controle los niveles de azcar en la sangre al despertar  Tambin compruebe los azcares de la sangre aproximadamente 2 horas despus de una comida y haga esto despus de diversas comidas por la rotacin  Los niveles recomendados de azcar en la sangre al despertar es de 90-130 y aproximadamente 2 horas despus de la comida es de 130-160

## 2016-07-10 NOTE — Progress Notes (Signed)
Patient ID: Gabrielle Waller, female   DOB: [birth year], 50 y.o.   MRN: [id number]   Reason for Appointment : Follow up for Type 1 Diabetes  History of Present Illness          Diagnosis: Type 1 diabetes mellitus, date of diagnosis: 2001         Past history: Her blood sugar at diagnosis was 1056 and was started on insulin She is typically having difficulty controlling her diabetes because of cost of her medications and compliance She is also having relatively labile blood sugars Has difficulty understanding instructions for day-to-day management for diabetes and although she claims to be compliant with her insulin doses as prescribed she does not often check her blood sugars as directed and not clear how her diet affects her blood sugars   Recent history:   INSULIN regimen is described as: Toujeo 50 q am; regular insulin 24 units at meals  History was obtained with the help of the interpreter   Her blood sugars have been very difficult to control and A1c has been usually 9% or more, now 9.7% She is usually requiring large doses of insulin despite not being obese.  She was using a FreeStyle Insulinx monitor, now sometimes using the [employer] brand   Current problems and glucose  patterns:  She is checking her blood sugar very sporadically especially the last 2 weeks, also has brought only one of her monitor for download  She had gone to [place] and forgot her Toujeo insulin and was taking NPH at that time  Not clear when she was taking NPH and she appeared to have consistently high readings frequently over 300 including in the morning at that time  Now fasting blood sugars have been consistently low but she now says that she is taking 50 units of TOUJEO instead of 44 she was previously taking  She was told to adjust her mealtime doses based on what she is eating but she is taking the same dose of 24 units  Also she has gone up on this dose also, previously taking about 20  units  She may feel hypoglycemic sometimes at work in the afternoon when she is a little more active  Has only sporadic readings in the evenings 1 none recently   Glucose monitoring:  done less than 1 times a day         Glucometer:  FreeStyle/[employer] brand    Blood Glucose readings from download as above  Self-care:  Meals: 2-3 meals per day.  Bfst 10 AM; lunch  2 PM and dinner 5  p.m.           Physical activity: exercise: None Usually active at work now working at [employer] visit: Most recent:. ?     She has seen diabetes educator several times      Wt Readings from Last 3 Encounters:  07/10/16 113 lb 6.4 oz (51.4 kg)  05/30/16 113 lb (51.3 kg)  03/08/16 113 lb 12.8 oz (51.6 kg)     Lab Results  Component Value Date   HGBA1C 9.7 (H) 05/22/2016   HGBA1C 9.8 (H) 02/28/2016   HGBA1C 9.5 (H) 12/02/2015   Lab Results  Component Value Date   MICROALBUR 7.5 (H) 12/02/2015   LDLCALC 86 08/31/2015   CREATININE 0.60 02/28/2016      Lab Results  Component Value Date   MICRALBCREAT 4.4 12/02/2015   MICRALBCREAT 1.9 09/20/2014       Medication List  Accurate as of 07/10/16  8:06 PM. Always use your most recent med list.          acetaminophen 325 MG tablet Commonly known as:  TYLENOL Take 650 mg by mouth every 6 (six) hours as needed.   aspirin 325 MG EC tablet Take 325 mg by mouth daily.   atorvastatin 40 MG tablet Commonly known as:  LIPITOR TAKE ONE TABLET BY MOUTH ONCE DAILY   FREESTYLE LITE test strip Generic drug:  glucose blood USE ONE STRIP TO CHECK GLUCOSE 4 TIMES DAILY   loratadine 10 MG tablet Commonly known as:  CLARITIN Take 1 tablet (10 mg total) by mouth daily.   NOVOLIN R RELION 100 units/mL injection Generic drug:  insulin regular INJECT 24 UNITS SUBCUTANEOUSLY THREE TIMES DAILY BEFORE  MEALS   TOUJEO SOLOSTAR 300 UNIT/ML Sopn Generic drug:  Insulin Glargine 44 Units.   Vitamin D (Ergocalciferol) 50000 units Caps  capsule Commonly known as:  DRISDOL Take 50,000 Units by mouth every 7 (seven) days.       Allergies:  Allergies  Allergen Reactions  . Penicillins Itching    Past Medical History:  Diagnosis Date  . Diabetes mellitus without complication (HCC)   . Kidney stones     Past Surgical History:  Procedure Laterality Date  . APPENDECTOMY      Family History  Problem Relation Age of Onset  . Diabetes Mother     Social History:  reports that she has never smoked. She has never used smokeless tobacco. She reports that she does not drink alcohol or use drugs.    Review of Systems:   She has a prior history of hyperthyroidism treated with Tapazole in 2001.   She has had slightly low TSH consistently and normal free T4 and T3 without recurrence of hyperthyroidism.    Lab Results  Component Value Date   FREET4 0.72 03/01/2014   FREET4 0.67 09/11/2013   TSH 0.20 (L) 02/28/2016   TSH 0.15 (L) 12/30/2014   TSH 0.27 (L) 03/01/2014     She has had significant hyperlipidemia including high triglycerides. she is taking Lipitor at night daily and has been given 40 mg  LDL is As follows  Lab Results  Component Value Date   CHOL 154 08/31/2015   HDL 39.50 08/31/2015   LDLCALC 86 08/31/2015   LDLDIRECT 119.9 09/20/2014   TRIG 142.0 08/31/2015   CHOLHDL 4 08/31/2015    She is not having any menstrual cycles, Complaining of tendency to sweating with activity    Physical Examination:  BP 126/84   Pulse (!) 105   Temp 98 F (36.7 C)   Resp 14   Ht 5' (1.524 m)   Wt 113 lb 6.4 oz (51.4 kg)   SpO2 98%   BMI 22.15 kg/m         ASSESSMENT/PLAN:   Diabetes type 1:  See history of present illness for detailed discussion of his current management, blood sugar patterns and problems identified  Her blood sugars are again difficult to regulate with issues with compliance, Lack of adequate glucose monitoring, variable meals regarding carbohydrate and fat She does not  adjust her insulin based on what she is eating or activity level Also taking excessive doses of both mealtime and basal insulin, previously taking 6 units less of the Toujeo Although she may be a good candidate for the continuous glucose sensor she will not be able to keep a record adequately on her own  Recommendations made  today:  Start using the same monitor consistently, given Freestyle test strips  Discussed need to check at least some readings after meals at night and some after all of her meals when she is not working  Reduce Toujeo 46  Reduce lunchtime dose of NovoLog down to 20 units  Consider consultation with diabetes educator again  She may increase her suppertime dose if eating a larger meal Also may still consider the continuous glucose monitoring on the next visit with instructions from nurse educator  Counseling time on subjects discussed above is over 50% of today's 25 minute visit    Patient Instructions  Take 20 units Regular at lunch when at work or eating smaller meals  TOUJEO 46 UNITS  Check blood sugars on waking up    Also check blood sugars about 2 hours after a meal and do this after different meals by rotation  Recommended blood sugar levels on waking up is 90-130 and about 2 hours after meal is 130-160  Please bring your blood sugar monitor to each visit, thank you   Tomar 20 unidades regulares en el almuerzo cuando en el trabajo o comer comidas ms pequeas  TOUJEO 46 UNIDADES  Controle los niveles de azcar en la sangre al despertar  Tambin compruebe los azcares de la sangre aproximadamente 2 horas despus de una comida y haga esto despus de diversas comidas por la rotacin  Los niveles recomendados de azcar en la sangre al despertar es de 90-130 y aproximadamente 2 horas despus de la comida es de 130-160        Gabrielle Waller 07/10/2016, 8:06 PM      No visits with results within 1 Week(s) from this visit.  Latest known visit  with results is:  Lab on 05/22/2016  Component Date Value Ref Range Status  . Hgb A1c MFr Bld 05/22/2016 9.7* 4.6 - 6.5 % Final  . Glucose, Bld 05/22/2016 111* 70 - 99 mg/dL Final

## 2016-08-03 ENCOUNTER — Other Ambulatory Visit: Payer: Self-pay | Admitting: Endocrinology

## 2016-08-16 ENCOUNTER — Other Ambulatory Visit: Payer: Self-pay | Admitting: Endocrinology

## 2016-09-05 ENCOUNTER — Other Ambulatory Visit (INDEPENDENT_AMBULATORY_CARE_PROVIDER_SITE_OTHER): Payer: BLUE CROSS/BLUE SHIELD

## 2016-09-05 DIAGNOSIS — R7989 Other specified abnormal findings of blood chemistry: Secondary | ICD-10-CM

## 2016-09-05 DIAGNOSIS — R946 Abnormal results of thyroid function studies: Secondary | ICD-10-CM

## 2016-09-05 DIAGNOSIS — E785 Hyperlipidemia, unspecified: Secondary | ICD-10-CM | POA: Diagnosis not present

## 2016-09-05 DIAGNOSIS — E1065 Type 1 diabetes mellitus with hyperglycemia: Secondary | ICD-10-CM | POA: Diagnosis not present

## 2016-09-05 LAB — TSH: TSH: 0.09 u[IU]/mL — AB (ref 0.35–4.50)

## 2016-09-05 LAB — LIPID PANEL
CHOL/HDL RATIO: 4
Cholesterol: 208 mg/dL — ABNORMAL HIGH (ref 0–200)
HDL: 52.3 mg/dL (ref >39.00)
LDL CALC: 136 mg/dL — AB (ref 0–99)
NONHDL: 155.31
Triglycerides: 98 mg/dL (ref 0.0–149.0)
VLDL: 19.6 mg/dL (ref 0.0–40.0)

## 2016-09-05 LAB — T4, FREE: FREE T4: 0.65 ng/dL (ref 0.60–1.60)

## 2016-09-05 LAB — GLUCOSE, RANDOM: Glucose, Bld: 251 mg/dL — ABNORMAL HIGH (ref 70–99)

## 2016-09-10 ENCOUNTER — Ambulatory Visit (INDEPENDENT_AMBULATORY_CARE_PROVIDER_SITE_OTHER): Payer: BLUE CROSS/BLUE SHIELD | Admitting: Endocrinology

## 2016-09-10 ENCOUNTER — Other Ambulatory Visit: Payer: Self-pay

## 2016-09-10 ENCOUNTER — Encounter: Payer: Self-pay | Admitting: Endocrinology

## 2016-09-10 VITALS — BP 128/70 | HR 97 | Ht 60.0 in | Wt 115.0 lb

## 2016-09-10 DIAGNOSIS — Z794 Long term (current) use of insulin: Secondary | ICD-10-CM | POA: Diagnosis not present

## 2016-09-10 DIAGNOSIS — Z23 Encounter for immunization: Secondary | ICD-10-CM | POA: Diagnosis not present

## 2016-09-10 DIAGNOSIS — E781 Pure hyperglyceridemia: Secondary | ICD-10-CM

## 2016-09-10 DIAGNOSIS — E1065 Type 1 diabetes mellitus with hyperglycemia: Secondary | ICD-10-CM

## 2016-09-10 DIAGNOSIS — N951 Menopausal and female climacteric states: Secondary | ICD-10-CM | POA: Diagnosis not present

## 2016-09-10 LAB — POCT GLYCOSYLATED HEMOGLOBIN (HGB A1C): HEMOGLOBIN A1C: 8.1

## 2016-09-10 MED ORDER — TOUJEO SOLOSTAR 300 UNIT/ML ~~LOC~~ SOPN: 43.0000 [IU] | PEN_INJECTOR | Freq: Three times a day (TID) | SUBCUTANEOUS | 3 refills | Status: DC

## 2016-09-10 MED ORDER — INSULIN REGULAR HUMAN 100 UNIT/ML IJ SOLN: INTRAMUSCULAR | 3 refills | Status: DC

## 2016-09-10 MED ORDER — GLUCOSE BLOOD VI STRP: ORAL_STRIP | 3 refills | Status: DC

## 2016-09-10 NOTE — Progress Notes (Signed)
Patient ID: Gabrielle Waller, female   DOB: 1966/07/23, 50 y.o.   MRN: [id number]   Reason for Appointment : Follow up for Type 1 Diabetes  History of Present Illness          Diagnosis: Type 1 diabetes mellitus, date of diagnosis: 2001         Past history: Her blood sugar at diagnosis was 1056 and was started on insulin She is typically having difficulty controlling her diabetes because of cost of her medications and compliance She is also having relatively labile blood sugars Has difficulty understanding instructions for day-to-day management for diabetes and although she claims to be compliant with her insulin doses as prescribed she does not often check her blood sugars as directed and not clear how her diet affects her blood sugars   Recent history:   INSULIN regimen is described as: Toujeo 42 q am; regular insulin 20 units at meals  History was obtained with the help of the interpreter   Her blood sugars have been consistently difficult to control and A1c has been usually 9% or more, now improved at 8% She is usually requiring large doses of insulin despite not being obese.  She was using a [employer] brand meter now and difficult to know what her blood sugar patterns are   Current problems and glucose  patterns:  She is checking her blood sugar more regularly than before  However sometimes difficult to know whether her evening readings are before or after eating, checking between about 7-9 PM  She says that she is working at [employer] mostly every evening until 11 PM, is somewhat more active when she is working  She does not take her insulin with her when she is going to work for her evening meal around 7 PM  Occasionally she is having low blood sugar symptoms before suppertime and will treat this with a half a glass of regular soft drinks  Since she has had occasional low normal sugars in the mornings or early morning she has reduced her Toujeo from 44 down to 42  units.  She is usually taking the same dose of 20 units of regular insulin with her meals, usually taking 24 units   Glucose monitoring:  done less than 1-2 times a day         Glucometer: [employer] brand    Blood Glucose readings from review of monitor as below  Mean values apply above for all meters except median for One Touch  PRE-MEAL Fasting Lunch Dinner Bedtime Overall  Glucose range: 61-119  65  93-453  112-424    Mean/median:     188     Self-care:  Meals: 2-3 meals per day.  Bfst 9 AM; lunch  12 PM and dinner 7  p.m.           Physical activity: exercise: None Usually active at work now working at [employer] visit: Most recent:. ?     She has seen diabetes educator several times      Wt Readings from Last 3 Encounters:  09/10/16 115 lb (52.2 kg)  07/10/16 113 lb 6.4 oz (51.4 kg)  05/30/16 113 lb (51.3 kg)     Lab Results  Component Value Date   HGBA1C 8.1 09/10/2016   HGBA1C 9.7 (H) 05/22/2016   HGBA1C 9.8 (H) 02/28/2016   Lab Results  Component Value Date   MICROALBUR 7.5 (H) 12/02/2015   LDLCALC 136 (H) 09/05/2016   CREATININE 0.60  02/28/2016      Lab Results  Component Value Date   MICRALBCREAT 4.4 12/02/2015   MICRALBCREAT 1.9 09/20/2014       Medication List       Accurate as of 09/10/16  1:11 PM. Always use your most recent med list.          acetaminophen 325 MG tablet Commonly known as:  TYLENOL Take 650 mg by mouth every 6 (six) hours as needed.   aspirin 325 MG EC tablet Take 325 mg by mouth daily.   atorvastatin 40 MG tablet Commonly known as:  LIPITOR TAKE ONE TABLET BY MOUTH ONCE DAILY   glucose blood test strip Commonly known as:  ONE TOUCH ULTRA TEST Use to check blood sugar 4 times daily   insulin regular 100 units/mL injection Commonly known as:  NOVOLIN R RELION INJECT 24 UNITS SUBCUTANEOUSLY THREE TIMES DAILY BEFORE  MEALS   loratadine 10 MG tablet Commonly known as:  CLARITIN Take 1 tablet (10 mg  total) by mouth daily.   TOUJEO SOLOSTAR 300 UNIT/ML Sopn Generic drug:  Insulin Glargine Inject 43 Units into the skin 3 (three) times daily.   Vitamin D (Ergocalciferol) 50000 units Caps capsule Commonly known as:  DRISDOL Take 50,000 Units by mouth every 7 (seven) days.       Allergies:  Allergies  Allergen Reactions  . Penicillins Itching    Past Medical History:  Diagnosis Date  . Diabetes mellitus without complication (HCC)   . Kidney stones     Past Surgical History:  Procedure Laterality Date  . APPENDECTOMY      Family History  Problem Relation Age of Onset  . Diabetes Mother     Social History:  reports that she has never smoked. She has never used smokeless tobacco. She reports that she does not drink alcohol or use drugs.    Review of Systems:   She has a prior history of hyperthyroidism treated with Tapazole in 2001.   She has had  low TSH consistently and normal free T4 and T3 without recurrence of hyperthyroidism.    Lab Results  Component Value Date   FREET4 0.65 09/05/2016   FREET4 0.72 03/01/2014   FREET4 0.67 09/11/2013   TSH 0.09 (L) 09/05/2016   TSH 0.20 (L) 02/28/2016   TSH 0.15 (L) 12/30/2014     She has had significant hyperlipidemia including high triglycerides. she is taking Lipitor at night daily and has been given 40 mg  LDL is As follows  Lab Results  Component Value Date   CHOL 208 (H) 09/05/2016   HDL 52.30 09/05/2016   LDLCALC 136 (H) 09/05/2016   LDLDIRECT 119.9 09/20/2014   TRIG 98.0 09/05/2016   CHOLHDL 4 09/05/2016    She is not having any menstrual cycles    Physical Examination:  BP 128/70   Pulse 97   Ht 5' (1.524 m)   Wt 115 lb (52.2 kg)   SpO2 98%   BMI 22.46 kg/m         ASSESSMENT/PLAN:   Diabetes type 1:  See history of present illness for detailed discussion of his current management, blood sugar patterns and problems identified  Her A1c is better than usual at 8.1% today  Her blood  sugars are again difficult to regulate with issues with compliance with insulin regimen, adequate glucose monitoring and sometimes high-fat meals She does not adjust her insulin based on what she is eating or activity level She does not take  her insulin with her at suppertime causing mostly high readings later in the evening Also not clear why some readings before supper also have been high and probably indicated that she may not take her lunchtime dose at times FASTING blood sugars appear to be low normal recently  Difficult to get a good blood sugar patterns as she is checking blood sugars mostly fasting and sometime in the evening and could not download her [employer] brand meter today  Recommendations made today:  Start using the same monitor consistently, given prescription for One Touch test strips  Discussed need to check at least some readings after meals at night and some after all of her meals when she is not working  Reduce Toujeo down to 38 units  Reduce suppertime or lunchtime dose of NovoLog down to 16 units when working otherwise continue 20 units unless eating a large meal when she may need more  Probably needs consultation with diabetes educator again   HYPERLIPIDEMIA: Not adequately controlled despite claiming good compliance with Lipitor.  Will have her increase the dose to 80 mg in neck prescription will be for Crestor  ABNORMAL thyroid functions: Consider secondary hypothyroidism although she is not symptomatic at this time, will also check FSH on the next visit  Counseling time on subjects discussed above is over 50% of today's 25 minute visit    Patient Instructions  Toujeo 38 units  Regular insulin 20 units before meals but when working then 16 units before meal at work  Take 2 pills of Lipitor     Yaasir Menken 09/10/2016, 1:11 PM      Office Visit on 09/10/2016  Component Date Value Ref Range Status  . Hemoglobin A1C 09/10/2016 8.1   Final  Lab on  09/05/2016  Component Date Value Ref Range Status  . Cholesterol 09/05/2016 208* 0 - 200 mg/dL Final  . Triglycerides 09/05/2016 98.0  0.0 - 149.0 mg/dL Final  . HDL 62/95/284111/11/2015 52.30  >39.00 mg/dL Final  . VLDL 32/44/010211/11/2015 19.6  0.0 - 40.0 mg/dL Final  . LDL Cholesterol 09/05/2016 136* 0 - 99 mg/dL Final  . Total CHOL/HDL Ratio 09/05/2016 4   Final  . NonHDL 09/05/2016 155.31   Final  . Glucose, Bld 09/05/2016 251* 70 - 99 mg/dL Final  . TSH 72/53/664411/11/2015 0.09* 0.35 - 4.50 uIU/mL Final  . Free T4 09/05/2016 0.65  0.60 - 1.60 ng/dL Final   Comment: Specimens from patients who are undergoing biotin therapy and /or ingesting biotin supplements may contain high levels of biotin.  The higher biotin concentration in these specimens interferes with this Free T4 assay.  Specimens that contain high levels  of biotin may cause false high results for this Free T4 assay.  Please interpret results in light of the total clinical presentation of the patient.

## 2016-09-10 NOTE — Patient Instructions (Addendum)
Toujeo 38 units  Regular insulin 20 units before meals but when working then 16 units before meal at work  Take 2 pills of Lipitor

## 2016-10-08 ENCOUNTER — Telehealth: Payer: Self-pay | Admitting: Endocrinology

## 2016-10-08 MED ORDER — ATORVASTATIN CALCIUM 40 MG PO TABS: 40.0000 mg | ORAL_TABLET | Freq: Every day | ORAL | 3 refills | Status: DC

## 2016-10-08 NOTE — Telephone Encounter (Signed)
Refill of atorvastatin (LIPITOR) 40 MG tablet,  [facility] [address] - [place] ([place]), [place] - [address] [phone] (Phone) [phone] (Fax)

## 2016-11-09 ENCOUNTER — Other Ambulatory Visit (INDEPENDENT_AMBULATORY_CARE_PROVIDER_SITE_OTHER): Payer: BLUE CROSS/BLUE SHIELD

## 2016-11-09 DIAGNOSIS — N951 Menopausal and female climacteric states: Secondary | ICD-10-CM | POA: Diagnosis not present

## 2016-11-09 DIAGNOSIS — E1065 Type 1 diabetes mellitus with hyperglycemia: Secondary | ICD-10-CM | POA: Diagnosis not present

## 2016-11-09 DIAGNOSIS — E781 Pure hyperglyceridemia: Secondary | ICD-10-CM | POA: Diagnosis not present

## 2016-11-09 LAB — FOLLICLE STIMULATING HORMONE: FSH: 110.3 m[IU]/mL

## 2016-11-09 LAB — T4, FREE: FREE T4: 0.69 ng/dL (ref 0.60–1.60)

## 2016-11-09 LAB — LDL CHOLESTEROL, DIRECT: Direct LDL: 81 mg/dL

## 2016-11-09 LAB — GLUCOSE, RANDOM: GLUCOSE: 267 mg/dL — AB (ref 70–99)

## 2016-11-10 LAB — FRUCTOSAMINE: Fructosamine: 375 umol/L — ABNORMAL HIGH (ref 0–285)

## 2016-11-13 ENCOUNTER — Encounter (INDEPENDENT_AMBULATORY_CARE_PROVIDER_SITE_OTHER): Payer: BLUE CROSS/BLUE SHIELD | Admitting: Endocrinology

## 2016-11-13 ENCOUNTER — Encounter: Payer: Self-pay | Admitting: Endocrinology

## 2016-11-13 NOTE — Progress Notes (Signed)
This encounter was created in error - please disregard.

## 2016-12-04 ENCOUNTER — Encounter: Payer: Self-pay | Admitting: Endocrinology

## 2016-12-04 ENCOUNTER — Ambulatory Visit (INDEPENDENT_AMBULATORY_CARE_PROVIDER_SITE_OTHER): Payer: BLUE CROSS/BLUE SHIELD | Admitting: Endocrinology

## 2016-12-04 VITALS — BP 138/80 | HR 86 | Ht 60.0 in | Wt 114.0 lb

## 2016-12-04 DIAGNOSIS — E1065 Type 1 diabetes mellitus with hyperglycemia: Secondary | ICD-10-CM | POA: Diagnosis not present

## 2016-12-04 MED ORDER — ATORVASTATIN CALCIUM 80 MG PO TABS: 80.0000 mg | ORAL_TABLET | Freq: Every day | ORAL | 4 refills | Status: DC

## 2016-12-04 NOTE — Patient Instructions (Addendum)
Toujeo 34 in am; regular insulin 20 units at LUNCH  REGULAR INSULIN 24 at dinner, may reduce to 20 if eating very little starchy foods; take 30 minutes before meal  DO not skip regular insulin at meal time even when sugar about 100  If sugar over 250 at bedtime take 6-8 extra Regular insulin only  For cereal at bedtime may take 10 Units Regular  Toujeo 34 en la maana; insulina regular 20 unidades en LUNCH  INSULINA REGULAR 24 en la cena, puede reducirse a 20 si come muy pocos alimentos con almidn; tomar 30 minutos antes de la comida  NO omita la insulina regular a la hora de la comida, incluso cuando Insurance claims handlerel azcar alrededor de 100  Si el azcar por encima de 250 a la hora de Teacher, musicacostarse tome 6-8 dosis adicionales de insulina regular solamente  Para el cereal antes de acostarse puede tomar 10 unidades regulares

## 2016-12-04 NOTE — Progress Notes (Signed)
Patient ID: Gabrielle Waller, female   DOB: 1966/01/01, 51 y.o.   MRN: [id number]   Reason for Appointment : Follow up for Type 1 Diabetes  History of Present Illness          Diagnosis: Type 1 diabetes mellitus, date of diagnosis: 2001         Past history: Her blood sugar at diagnosis was 1056 and was started on insulin She is typically having difficulty controlling her diabetes because of cost of her medications and compliance She is also having relatively labile blood sugars Has difficulty understanding instructions for day-to-day management for diabetes and although she claims to be compliant with her insulin doses as prescribed she does not often check her blood sugars as directed and not clear how her diet affects her blood sugars   Recent history:   INSULIN regimen is described as: Toujeo 38 q am; regular insulin 20 units at meals  History was obtained with the help of the interpreter   Her blood sugars have been consistently difficult to control and A1c has been usually 9% or more, last 8.1 Fructosamine was high at 375 this month  She is usually requiring large doses of insulin despite not being obese.  She was using a [employer] brand meter now and difficult to know what her blood sugar patterns are   Current problems and glucose  patterns:  She is checking her blood sugar mostly at bedtime and occasionally in the morning  She is still not adjusting her mealtime insulin based on what she is eating  She has persistently high readings AFTER her evening meal except once or twice  She also said that if her blood sugar is normal before suppertime she will not take her insulin at that time  She also is taking around 16 units or more of regular insulin at bedtime if her blood sugars are high  Occasionally will wake up during the night feeling sweaty even when blood sugars around 90  FASTING blood sugars are checked sporadically and these are mostly normal  She does not  know why her sugar was 494 today, she had cereal at bedtime without any insulin coverage  She has only sporadically readings at suppertime which are not appearing high  She does not think she is eating breakfast now  Still not able to get the One Touch meter covered by her insurance  Not doing much exercise currently   Glucose monitoring:  done less than 1-2 times a day         Glucometer: [employer] brand    Blood Glucose readings from review of monitor as below   PRE-MEAL Fasting Lunch Dinner Bedtime Overall  Glucose range: 62- 229   89, 155  110-412    Mean/median:        Self-care:  Meals: 2-3 meals per day.  Bfst none; lunch  12 PM and dinner 7  p.m.           Physical activity: exercise: None   Dietician visit: Most recent:. ?     She has seen diabetes educator several times      Wt Readings from Last 3 Encounters:  12/04/16 114 lb (51.7 kg)  09/10/16 115 lb (52.2 kg)  07/10/16 113 lb 6.4 oz (51.4 kg)     Lab Results  Component Value Date   HGBA1C 8.1 09/10/2016   HGBA1C 9.7 (H) 05/22/2016   HGBA1C 9.8 (H) 02/28/2016   Lab Results  Component Value Date  MICROALBUR 7.5 (H) 12/02/2015   LDLCALC 136 (H) 09/05/2016   CREATININE 0.60 02/28/2016      Lab Results  Component Value Date   MICRALBCREAT 4.4 12/02/2015   MICRALBCREAT 1.9 09/20/2014     Allergies as of 12/04/2016      Reactions   Penicillins Itching      Medication List       Accurate as of 12/04/16 10:30 AM. Always use your most recent med list.          acetaminophen 325 MG tablet Commonly known as:  TYLENOL Take 650 mg by mouth every 6 (six) hours as needed.   aspirin 325 MG EC tablet Take 325 mg by mouth daily.   atorvastatin 40 MG tablet Commonly known as:  LIPITOR Take 1 tablet (40 mg total) by mouth daily.   glucose blood test strip Commonly known as:  ONE TOUCH ULTRA TEST Use to check blood sugar 4 times daily   insulin regular 250 units/2.355mL (100 units/mL)  injection Commonly known as:  NOVOLIN R RELION INJECT 24 UNITS SUBCUTANEOUSLY THREE TIMES DAILY BEFORE  MEALS   loratadine 10 MG tablet Commonly known as:  CLARITIN Take 1 tablet (10 mg total) by mouth daily.   TOUJEO SOLOSTAR 300 UNIT/ML Sopn Generic drug:  Insulin Glargine Inject 43 Units into the skin 3 (three) times daily.   Vitamin D (Ergocalciferol) 50000 units Caps capsule Commonly known as:  DRISDOL Take 50,000 Units by mouth every 7 (seven) days.       Allergies:  Allergies  Allergen Reactions  . Penicillins Itching    Past Medical History:  Diagnosis Date  . Diabetes mellitus without complication (HCC)   . Kidney stones     Past Surgical History:  Procedure Laterality Date  . APPENDECTOMY      Family History  Problem Relation Age of Onset  . Diabetes Mother     Social History:  reports that she has never smoked. She has never used smokeless tobacco. She reports that she does not drink alcohol or use drugs.    Review of Systems:   She has a prior history of hyperthyroidism treated with Tapazole in 2001.   She has had  low TSH consistently and normal free T4 and T3 without recurrence of hyperthyroidism.    Lab Results  Component Value Date   FREET4 0.69 11/09/2016   FREET4 0.65 09/05/2016   FREET4 0.72 03/01/2014   TSH 0.09 (L) 09/05/2016   TSH 0.20 (L) 02/28/2016   TSH 0.15 (L) 12/30/2014     She has had significant hyperlipidemia including high triglycerides. she is taking Lipitor at night daily and has been Asked to take 80 mg LDL is as follows  Lab Results  Component Value Date   CHOL 208 (H) 09/05/2016   HDL 52.30 09/05/2016   LDLCALC 136 (H) 09/05/2016   LDLDIRECT 81.0 11/09/2016   TRIG 98.0 09/05/2016   CHOLHDL 4 09/05/2016    She is not having any menstrual cycles, No recent hot flashes FSH is high    Physical Examination:  BP 138/80   Pulse 86   Ht 5' (1.524 m)   Wt 114 lb (51.7 kg)   SpO2 98%   BMI 22.26 kg/m          ASSESSMENT/PLAN:   Diabetes type 1:  See history of present illness for detailed discussion of his current management, blood sugar patterns and problems identified  Her A1c is better than usual at 8.1% today  Her blood sugars are again difficult to regulate with issues with Inadequate insulin at suppertime and patient not understanding how to take her regular insulin both at mealtimes and correction doses as discussed above She is also not checking her blood sugars enough Showed her how the freestyle [place] system would work and should be affordable for her  Recommendations made today:  Start using the [employer] system  Discussed principles of taking mealtime insulin  She will need to increase her suppertime dose by at least 4 units on a regular basis unless eating a very low carbohydrate meals  Reduce Toujeo back to 34 units but if morning sugars start going up will need to increase; currently is taking some extra insulin at bedtime but not clear how often  She needs consultation with diabetes educator again   HYPERLIPIDEMIA: Last LDL was normal with increasing Lipitor  ABNORMAL thyroid functions: Free T4 is normal now  MENOPAUSE:  FSH is high, she will need to discuss this with her gynecologist, she probably does have an IUD in place  Counseling time on subjects discussed above is over 50% of today's 25 minute visit    Patient Instructions   Toujeo 34 in am; regular insulin 20 units at LUNCH  REGULAR INSULIN 24 at dinner, may reduce to 20 if eating very little starchy foods; take 30 minutes before meal  DO not skip regular insulin at meal time even when sugar about 100  If sugar over 250 at bedtime take 6-8 extra Regular insulin only  For cereal at bedtime may take 10 Units Regular  Toujeo 34 en la maana; insulina regular 20 unidades en LUNCH  INSULINA REGULAR 24 en la cena, puede reducirse a 20 si come muy pocos alimentos con almidn; tomar 30 minutos  antes de la comida  NO omita la insulina regular a la hora de la comida, incluso cuando Insurance claims handler alrededor de 100  Si el azcar por encima de 250 a la hora de Teacher, music tome 6-8 dosis adicionales de insulina regular solamente  Para el cereal antes de acostarse puede tomar 10 unidades regulares         Gabrielle Waller 12/04/2016, 10:30 AM      No visits with results within 1 Week(s) from this visit.  Latest known visit with results is:  Lab on 11/09/2016  Component Date Value Ref Range Status  . Fructosamine 11/09/2016 375* 0 - 285 umol/L Final   Comment: Published reference interval for apparently healthy subjects between age 59 and 5 is 90 - 285 umol/L and in a poorly controlled diabetic population is 228 - 563 umol/L with a mean of 396 umol/L.   Marland Kitchen Glucose, Bld 11/09/2016 267* 70 - 99 mg/dL Final  . Free T4 16/08/9603 0.69  0.60 - 1.60 ng/dL Final   Comment: Specimens from patients who are undergoing biotin therapy and /or ingesting biotin supplements may contain high levels of biotin.  The higher biotin concentration in these specimens interferes with this Free T4 assay.  Specimens that contain high levels  of biotin may cause false high results for this Free T4 assay.  Please interpret results in light of the total clinical presentation of the patient.    Marland Kitchen FSH 11/09/2016 110.3  mIU/ML Final  . Direct LDL 11/09/2016 81.0  mg/dL Final

## 2016-12-12 ENCOUNTER — Encounter: Payer: BLUE CROSS/BLUE SHIELD | Attending: Endocrinology | Admitting: Nutrition

## 2016-12-18 ENCOUNTER — Other Ambulatory Visit: Payer: Self-pay

## 2016-12-18 MED ORDER — TOUJEO SOLOSTAR 300 UNIT/ML ~~LOC~~ SOPN: 34.0000 [IU] | PEN_INJECTOR | Freq: Every morning | SUBCUTANEOUS | 3 refills | Status: DC

## 2016-12-20 ENCOUNTER — Other Ambulatory Visit: Payer: Self-pay

## 2016-12-20 ENCOUNTER — Telehealth: Payer: Self-pay | Admitting: Internal Medicine

## 2016-12-20 MED ORDER — TOUJEO SOLOSTAR 300 UNIT/ML ~~LOC~~ SOPN: 34.0000 [IU] | PEN_INJECTOR | Freq: Every morning | SUBCUTANEOUS | 3 refills | Status: DC

## 2016-12-20 NOTE — Telephone Encounter (Signed)
Please resubmit Toujeo prescription sent to [facility] on [address].

## 2016-12-20 NOTE — Telephone Encounter (Signed)
Ordered 12/20/2016

## 2016-12-25 NOTE — Patient Instructions (Signed)
Return in 2 weeks

## 2016-12-25 NOTE — Progress Notes (Signed)
Gabrielle HesselbachMaria is here today to get a CGM sensor.  Her interpreter was in with her at her last Dr. Alfonzo BeersAppt. And said that she was to get one here.  I attached the sensor to her upper outer left arm and she reported no discomfort.  She will return in 2 weeks for review of her readings.  According to her interpreter, the patient can not read or write, so food record log was not given to her.

## 2016-12-26 ENCOUNTER — Encounter: Payer: BLUE CROSS/BLUE SHIELD | Admitting: Nutrition

## 2016-12-27 ENCOUNTER — Telehealth: Payer: Self-pay | Admitting: Endocrinology

## 2016-12-27 NOTE — Progress Notes (Signed)
REviewed how/when/where to administer the sensor.  She was shown how to do this and and had no final questions.  She is pleased that she will not have to prick her fingers, but is concerned about the cost. She was given the reader, and the date/time were sent in spanish.

## 2016-12-27 NOTE — Telephone Encounter (Signed)
Refill submitted on 12/20/2016.

## 2016-12-27 NOTE — Telephone Encounter (Signed)
Please call in Toujeo at the [facility] [address] [facility]. Thank you

## 2016-12-27 NOTE — Patient Instructions (Addendum)
Put on a sensor every 10 days Bring scanner to office at every visit

## 2016-12-31 ENCOUNTER — Other Ambulatory Visit: Payer: Self-pay

## 2017-01-04 ENCOUNTER — Telehealth: Payer: Self-pay | Admitting: Endocrinology

## 2017-01-04 NOTE — Telephone Encounter (Signed)
Patient is calling on the status of new meter, it is not at the pharmacy, she has some other questions please advise

## 2017-01-07 ENCOUNTER — Other Ambulatory Visit: Payer: Self-pay

## 2017-01-07 MED ORDER — FREESTYLE LIBRE SENSOR SYSTEM MISC: 1.0000 | 3 refills | Status: DC

## 2017-01-07 MED ORDER — FREESTYLE LIBRE READER DEVI: 1.0000 | Freq: Once | 1 refills | Status: AC

## 2017-01-08 NOTE — Telephone Encounter (Signed)
This has been resolved

## 2017-01-25 ENCOUNTER — Other Ambulatory Visit (INDEPENDENT_AMBULATORY_CARE_PROVIDER_SITE_OTHER): Payer: BLUE CROSS/BLUE SHIELD

## 2017-01-25 DIAGNOSIS — E1065 Type 1 diabetes mellitus with hyperglycemia: Secondary | ICD-10-CM

## 2017-01-25 LAB — COMPREHENSIVE METABOLIC PANEL
ALT: 22 U/L (ref 0–35)
AST: 26 U/L (ref 0–37)
Albumin: 4.4 g/dL (ref 3.5–5.2)
Alkaline Phosphatase: 83 U/L (ref 39–117)
BUN: 20 mg/dL (ref 6–23)
CALCIUM: 9.9 mg/dL (ref 8.4–10.5)
CHLORIDE: 105 meq/L (ref 96–112)
CO2: 29 meq/L (ref 19–32)
Creatinine, Ser: 0.7 mg/dL (ref 0.40–1.20)
GFR: 93.93 mL/min (ref >60.00)
GLUCOSE: 124 mg/dL — AB (ref 70–99)
Potassium: 4.3 mEq/L (ref 3.5–5.1)
Sodium: 141 mEq/L (ref 135–145)
Total Bilirubin: 0.6 mg/dL (ref 0.2–1.2)
Total Protein: 7.4 g/dL (ref 6.0–8.3)

## 2017-01-25 LAB — HEMOGLOBIN A1C: Hgb A1c MFr Bld: 8.3 % — ABNORMAL HIGH (ref 4.6–6.5)

## 2017-01-30 ENCOUNTER — Ambulatory Visit (INDEPENDENT_AMBULATORY_CARE_PROVIDER_SITE_OTHER): Payer: BLUE CROSS/BLUE SHIELD | Admitting: Endocrinology

## 2017-01-30 ENCOUNTER — Encounter: Payer: Self-pay | Admitting: Endocrinology

## 2017-01-30 VITALS — BP 130/78 | HR 78 | Ht 60.0 in | Wt 112.0 lb

## 2017-01-30 DIAGNOSIS — E1065 Type 1 diabetes mellitus with hyperglycemia: Secondary | ICD-10-CM | POA: Diagnosis not present

## 2017-01-30 NOTE — Patient Instructions (Signed)
Take 20 units before lunch  30 Toujeo

## 2017-01-30 NOTE — Progress Notes (Signed)
Patient ID: Gabrielle Waller, female   DOB: Mar 15, 1966, 51 y.o.   MRN: [id number]   Reason for Appointment : Follow up for Type 1 Diabetes  History of Present Illness          Diagnosis: Type 1 diabetes mellitus, date of diagnosis: 2001         Past history: Her blood sugar at diagnosis was 1056 and was started on insulin She is typically having difficulty controlling her diabetes because of cost of her medications and compliance She is also having relatively labile blood sugars Has difficulty understanding instructions for day-to-day management for diabetes and although she claims to be compliant with her insulin doses as prescribed she does not often check her blood sugars as directed and not clear how her diet affects her blood sugars   Recent history:   INSULIN regimen is described as: Toujeo 34 q am; regular insulin 24 units at meals  History was obtained with the help of the interpreter   Her blood sugars have been consistently difficult to control and A1c has been usually 9% or more, last 8.3  She is usually requiring large doses of insulin despite not being obese.  She was using a [employer] brand meter now    Current problems and glucose  patterns:  She is checking her blood sugar at variable times, 1 in average about once a day  She also had a continuous glucose sensor a few weeks ago which showed marked variability in blood sugars and no consistent pattern  She cannot afford the freestyle Libre sensor, she was told it would cost $150  She is also concerned about the cost of Toujeo  FASTING blood sugars are generally normal to slightly low and only occasionally are higher  This is despite reducing her Tresiba by 4 units on the last visit  Her day-to-day management was reviewed: Appears that she does not consistently take her regular insulin before her meals especially when she is busy or eating out, has had readings over 400 occasionally likely to be from missing  her insulin dose  Also she was told to take only 20 units for her lunch but is taking 24 and has had a couple of low sugars before supper and once after lunch  She is also not consistent with her diet with variable carbohydrate and fat intake, does not adjust her insulin based on what she is eating   Glucose monitoring:  done less than 1-2 times a day         Glucometer: [employer] brand    Blood Glucose readings from review of monitor as below  Mean values apply above for all meters except median for One Touch  PRE-MEAL Fasting Lunch Dinner Bedtime Overall  Glucose range: 48-344      Mean/median: 102     213    POST-MEAL PC Breakfast PC Lunch PC Dinner  Glucose range:  59-270 48-600  Mean/median:   256    Self-care:  Meals: 2-3 meals per day.  Bfst none; lunch  12 PM and dinner 7  p.m.           Physical activity: exercise: None   Dietician visit: Most recent:. ?     She has seen diabetes educator several times      Wt Readings from Last 3 Encounters:  01/30/17 112 lb (50.8 kg)  12/04/16 114 lb (51.7 kg)  09/10/16 115 lb (52.2 kg)     Lab Results  Component Value  Date   HGBA1C 8.3 (H) 01/25/2017   HGBA1C 8.1 09/10/2016   HGBA1C 9.7 (H) 05/22/2016   Lab Results  Component Value Date   MICROALBUR 7.5 (H) 12/02/2015   LDLCALC 136 (H) 09/05/2016   CREATININE 0.70 01/25/2017      Lab Results  Component Value Date   MICRALBCREAT 4.4 12/02/2015   MICRALBCREAT 1.9 09/20/2014     Allergies as of 01/30/2017      Reactions   Penicillins Itching      Medication List       Accurate as of 01/30/17 11:50 AM. Always use your most recent med list.          acetaminophen 325 MG tablet Commonly known as:  TYLENOL Take 650 mg by mouth every 6 (six) hours as needed.   aspirin 325 MG EC tablet Take 325 mg by mouth daily.   atorvastatin 80 MG tablet Commonly known as:  LIPITOR Take 1 tablet (80 mg total) by mouth daily.   FREESTYLE LIBRE SENSOR SYSTEM Misc 1 strip  by Does not apply route every 14 (fourteen) days. Dx code- E10.65 NDC- 575-99-0000-19   glucose blood test strip Commonly known as:  ONE TOUCH ULTRA TEST Use to check blood sugar 4 times daily   insulin regular 250 units/2.4mL (100 units/mL) injection Commonly known as:  NOVOLIN R RELION INJECT 24 UNITS SUBCUTANEOUSLY THREE TIMES DAILY BEFORE  MEALS   loratadine 10 MG tablet Commonly known as:  CLARITIN Take 1 tablet (10 mg total) by mouth daily.   TOUJEO SOLOSTAR 300 UNIT/ML Sopn Generic drug:  Insulin Glargine Inject 34 Units into the skin every morning.   Vitamin D (Ergocalciferol) 50000 units Caps capsule Commonly known as:  DRISDOL Take 50,000 Units by mouth every 7 (seven) days.       Allergies:  Allergies  Allergen Reactions  . Penicillins Itching    Past Medical History:  Diagnosis Date  . Diabetes mellitus without complication (HCC)   . Kidney stones     Past Surgical History:  Procedure Laterality Date  . APPENDECTOMY      Family History  Problem Relation Age of Onset  . Diabetes Mother     Social History:  reports that she has never smoked. She has never used smokeless tobacco. She reports that she does not drink alcohol or use drugs.    Review of Systems:   She has a prior history of hyperthyroidism treated with Tapazole in 2001.   She has had  low TSH consistently and normal free T4 and T3 without recurrence of hyperthyroidism.    Lab Results  Component Value Date   FREET4 0.69 11/09/2016   FREET4 0.65 09/05/2016   FREET4 0.72 03/01/2014   TSH 0.09 (L) 09/05/2016   TSH 0.20 (L) 02/28/2016   TSH 0.15 (L) 12/30/2014     She has had significant hyperlipidemia including high triglycerides. she is taking Lipitor at night daily and has been Asked to take 80 mg LDL is Below 100 on the last measurement  Lab Results  Component Value Date   CHOL 208 (H) 09/05/2016   HDL 52.30 09/05/2016   LDLCALC 136 (H) 09/05/2016   LDLDIRECT 81.0  11/09/2016   TRIG 98.0 09/05/2016   CHOLHDL 4 09/05/2016    She is not having any menstrual cycles, No recent hot flashes FSH Was high    Physical Examination:  BP 130/78   Pulse 78   Ht 5' (1.524 m)   Wt 112 lb (50.8  kg)   SpO2 97%   BMI 21.87 kg/m         ASSESSMENT/PLAN:   Diabetes type 1:  See history of present illness for detailed discussion of his current management, blood sugar patterns and problems identified  Blood sugars are quite inconsistent at all times of the day especially nonfasting Recently appears to have lower readings overnight and with the couple of episodes of hypoglycemia with readings as low as 48 in the morning Only has a couple of high readings before breakfast Postprandial hyperglycemia is related to somewhat variable compliance with her mealtime insulin as well as variability in her diet with sometimes eating more carbohydrate and higher fat meals.  Also likely not getting consistent control using regular insulin, has had a couple of low sugars in the early evening also with this  Recommendations made today:  Start using the [employer] pump after training from the nurse educator as long as she is able to afford this  Discussed principles of basal bolus insulin and how the V-go pump would be utilized as well as the convenience and hopefully better control with this.  She will have the pump available for boluses all the time  However not clear if she will get enough insulin for her boluses at meals  Meanwhile she will reduce her Toujeo by 4 units and lunchtime regular insulin by 4 units also  Discussed blood sugar targets at various times, to call if she has low sugars fasting again   Counseling time on subjects discussed above is over 50% of today's 25 minute visit    Patient Instructions  Take 20 units before lunch  30 Toujeo     Gabrielle Waller 01/30/2017, 11:50 AM      Lab on 01/25/2017  Component Date Value Ref Range Status  . Hgb A1c  MFr Bld 01/25/2017 8.3* 4.6 - 6.5 % Final  . Sodium 01/25/2017 141  135 - 145 mEq/L Final  . Potassium 01/25/2017 4.3  3.5 - 5.1 mEq/L Final  . Chloride 01/25/2017 105  96 - 112 mEq/L Final  . CO2 01/25/2017 29  19 - 32 mEq/L Final  . Glucose, Bld 01/25/2017 124* 70 - 99 mg/dL Final  . BUN 16/08/9603 20  6 - 23 mg/dL Final  . Creatinine, Ser 01/25/2017 0.70  0.40 - 1.20 mg/dL Final  . Total Bilirubin 01/25/2017 0.6  0.2 - 1.2 mg/dL Final  . Alkaline Phosphatase 01/25/2017 83  39 - 117 U/L Final  . AST 01/25/2017 26  0 - 37 U/L Final  . ALT 01/25/2017 22  0 - 35 U/L Final  . Total Protein 01/25/2017 7.4  6.0 - 8.3 g/dL Final  . Albumin 54/07/8118 4.4  3.5 - 5.2 g/dL Final  . Calcium 14/78/2956 9.9  8.4 - 10.5 mg/dL Final  . GFR 21/30/8657 93.93  >60.00 mL/min Final

## 2017-02-04 ENCOUNTER — Telehealth: Payer: Self-pay | Admitting: Endocrinology

## 2017-02-04 NOTE — Telephone Encounter (Signed)
Left a detailed vm and requested a call back if they had any questions

## 2017-02-04 NOTE — Telephone Encounter (Signed)
[employer] Call:  Thalia Party from [employer] states [employer] is covered under medical and patient can [place] copay card at pharmacy. [phone]

## 2017-02-12 ENCOUNTER — Encounter: Payer: BLUE CROSS/BLUE SHIELD | Admitting: Nutrition

## 2017-02-19 ENCOUNTER — Encounter: Payer: BLUE CROSS/BLUE SHIELD | Admitting: Nutrition

## 2017-02-20 ENCOUNTER — Ambulatory Visit: Payer: BLUE CROSS/BLUE SHIELD | Admitting: Endocrinology

## 2017-03-11 ENCOUNTER — Encounter: Payer: Self-pay | Admitting: Endocrinology

## 2017-03-11 ENCOUNTER — Ambulatory Visit (INDEPENDENT_AMBULATORY_CARE_PROVIDER_SITE_OTHER): Payer: BLUE CROSS/BLUE SHIELD | Admitting: Endocrinology

## 2017-03-11 VITALS — BP 130/70 | HR 89 | Ht 60.0 in | Wt 109.8 lb

## 2017-03-11 DIAGNOSIS — R946 Abnormal results of thyroid function studies: Secondary | ICD-10-CM

## 2017-03-11 DIAGNOSIS — R7989 Other specified abnormal findings of blood chemistry: Secondary | ICD-10-CM

## 2017-03-11 DIAGNOSIS — E1065 Type 1 diabetes mellitus with hyperglycemia: Secondary | ICD-10-CM | POA: Diagnosis not present

## 2017-03-11 NOTE — Patient Instructions (Addendum)
Must take regular insulin BEFORE each meal  Adjust dose of R insulin based on how much you are eating  Check blood sugars on waking up  4x weekly  Also check blood sugars about 2 hours after a meal and do this after different meals by rotation  Recommended blood sugar levels on waking up is 90-130 and about 2 hours after meal is 130-160  Please bring your blood sugar monitor to each visit, thank you  Find out insurance coverage for glucose meter   Debe tomar insulina regular ANTES de cada comida  Ajuste la dosis de insulina R basndose en la cantidad que est comiendo  Eli Lilly and CompanyControle los niveles de azcar en la sangre al despertar 4 veces por semana  Tambin revise los niveles de azcar en la sangre aproximadamente 2 horas despus de una comida y haga esto despus de diferentes comidas por rotacin  Los niveles recomendados de azcar en la sangre al despertar son 90-130 y aproximadamente 2 horas despus de la comida es de 130-160  Por favor traiga su monitor de Bankerazcar en la sangre a cada visita, gracias  Descubra la cobertura de seguro para el medidor de glucosa

## 2017-03-11 NOTE — Progress Notes (Signed)
Patient ID: Gabrielle Waller, female   DOB: 1966/07/29, 51 y.o.   MRN: [id number]   Reason for Appointment : Follow up for Type 1 Diabetes  History of Present Illness          Diagnosis: Type 1 diabetes mellitus, date of diagnosis: 2001         Past history: Her blood sugar at diagnosis was 1056 and was started on insulin She is typically having difficulty controlling her diabetes because of cost of her medications and compliance She is also having relatively labile blood sugars Has difficulty understanding instructions for day-to-day management for diabetes and although she claims to be compliant with her insulin doses as prescribed she does not often check her blood sugars as directed and not clear how her diet affects her blood sugars   Recent history:   INSULIN regimen is described as: Toujeo 34 q am; regular insulin 24 units at meals  History was obtained with the help of the interpreter   Her blood sugars have been consistently difficult to control and A1c has been usually 9% or more, last 8.3  She is still using a [employer] brand meter and this could not be downloaded today   Current problems and glucose  patterns:  She is checking her blood sugar mostly before breakfast and suppertime even though she thinks she is taking readings at other times also  Blood sugars are again quite variable at both times  She is still using the Generic monitor and apparently could not afford the freestyle Libre, meter could not be downloaded today  She also is having difficulty with affording her insulin including Toujeo now, not clear why she is not being able to use a co-pay card  She is now working in the evenings and is not always taking her insulin with her when she is eating her evening meal, again does not take her insulin with her as directed on each visit  However she is giving conflicting replies about whether she is taking her lunch and suppertime regular insulin  consistently  Also she was told to take only 20 units for her lunch but is taking 24 and has had occasional symptoms of low sugars at suppertime and occasionally at work also  She is also not consistent with her diet with variable carbohydrate and fat intake, last evening had 2 servings of ice cream sandwiches and blood sugar was over 300 this morning   Glucose monitoring:  done  1-2 times a day         Glucometer: [employer] brand    Blood Glucose readings from review of monitor as below  Mean values apply above for all meters except median for One Touch  PRE-MEAL Fasting Lunch Dinner Bedtime Overall  Glucose range: 74-340  63-356    Mean/median:     220   Self-care:  Meals: 2-3 meals per day.  Breakfast Usually none; lunch  12 PM and dinner 7  p.m.            Physical activity: exercise: None  Dietician visit: Most recent:. ?     She has seen diabetes educator several times      Wt Readings from Last 3 Encounters:  03/11/17 109 lb 12.8 oz (49.8 kg)  01/30/17 112 lb (50.8 kg)  12/04/16 114 lb (51.7 kg)     Lab Results  Component Value Date   HGBA1C 8.3 (H) 01/25/2017   HGBA1C 8.1 09/10/2016   HGBA1C 9.7 (H) 05/22/2016  Lab Results  Component Value Date   MICROALBUR 7.5 (H) 12/02/2015   LDLCALC 136 (H) 09/05/2016   CREATININE 0.70 01/25/2017      Lab Results  Component Value Date   MICRALBCREAT 4.4 12/02/2015   MICRALBCREAT 1.9 09/20/2014     Allergies as of 03/11/2017      Reactions   Penicillins Itching      Medication List       Accurate as of 03/11/17 12:37 PM. Always use your most recent med list.          acetaminophen 325 MG tablet Commonly known as:  TYLENOL Take 650 mg by mouth every 6 (six) hours as needed.   aspirin 325 MG EC tablet Take 325 mg by mouth daily.   atorvastatin 80 MG tablet Commonly known as:  LIPITOR Take 1 tablet (80 mg total) by mouth daily.   FREESTYLE LIBRE SENSOR SYSTEM Misc 1 strip by Does not apply route every 14  (fourteen) days. Dx code- E10.65 NDC- 575-99-0000-19   glucose blood test strip Commonly known as:  ONE TOUCH ULTRA TEST Use to check blood sugar 4 times daily   insulin regular 250 units/2.51mL (100 units/mL) injection Commonly known as:  NOVOLIN R RELION INJECT 24 UNITS SUBCUTANEOUSLY THREE TIMES DAILY BEFORE  MEALS   loratadine 10 MG tablet Commonly known as:  CLARITIN Take 1 tablet (10 mg total) by mouth daily.   TOUJEO SOLOSTAR 300 UNIT/ML Sopn Generic drug:  Insulin Glargine Inject 34 Units into the skin every morning.   Vitamin D (Ergocalciferol) 50000 units Caps capsule Commonly known as:  DRISDOL Take 50,000 Units by mouth every 7 (seven) days.       Allergies:  Allergies  Allergen Reactions  . Penicillins Itching    Past Medical History:  Diagnosis Date  . Diabetes mellitus without complication (HCC)   . Kidney stones     Past Surgical History:  Procedure Laterality Date  . APPENDECTOMY      Family History  Problem Relation Age of Onset  . Diabetes Mother     Social History:  reports that she has never smoked. She has never used smokeless tobacco. She reports that she does not drink alcohol or use drugs.    Review of Systems:   She has a prior history of hyperthyroidism treated with Tapazole in 2001.   She has had A variably low TSH consistently and normal free T4 and T3   Lab Results  Component Value Date   FREET4 0.69 11/09/2016   FREET4 0.65 09/05/2016   FREET4 0.72 03/01/2014   TSH 0.09 (L) 09/05/2016   TSH 0.20 (L) 02/28/2016   TSH 0.15 (L) 12/30/2014     She has had significant hyperlipidemia including high triglycerides. she is taking Lipitor 80 mg at night daily LDL is Below 100 on the last measurement  Lab Results  Component Value Date   CHOL 208 (H) 09/05/2016   HDL 52.30 09/05/2016   LDLCALC 136 (H) 09/05/2016   LDLDIRECT 81.0 11/09/2016   TRIG 98.0 09/05/2016   CHOLHDL 4 09/05/2016    She is not having any menstrual  cycles, No recent hot flashes FSH Was high    Physical Examination:  BP 130/70   Pulse 89   Ht 5' (1.524 m)   Wt 109 lb 12.8 oz (49.8 kg)   SpO2 98%   BMI 21.44 kg/m         ASSESSMENT/PLAN:   Diabetes type 1:  See history of  present illness for detailed discussion of his current management, blood sugar patterns and problems identified  Blood sugars are quite inconsistent at all times of the day But is checking mostly before breakfast and supper She has difficulty understanding the need for consistent basal bolus regimen with mealtime coverage consistently and adjustment of dose based on what she is eating Most of her variability is related to either diet changes or not taking mealtime insulin when she needs to She cannot understand how to adjust her insulin when she is eating She has been towards take her insulin with her at work but she does not do so again She is also not able to afford the [employer] pump or the freestyle [place] sensor Not clear what meter is covered by her insurance  Recommendations made today:  She will draw up her insulin and take it to work for mealtime coverage and since she is more active at work she can take 18-20 units instead of the usual 24  No change in [place]  She can check coverage for [place] and also given new co-pay card  She will call back to let us know what meter is covered by her insurance  Discussed timing of mealtime insulin  Needs to check blood sugars more often after meals   Counseling time on subjects discussed above is over 50% of today's 25 minute visit    Patient Instructions  Must take regular insulin BEFORE each meal  Adjust dose of R insulin based on how much you are eating  Check blood sugars on waking up  4x weekly  Also check blood sugars about 2 hours after a meal and do this after different meals by rotation  Recommended blood sugar levels on waking up is 90-130 and about 2 hours after meal is  130-160  Please bring your blood sugar monitor to each visit, thank you  Find out insurance coverage for glucose meter   Debe tomar insulina regular ANTES de cada comida  Ajuste la dosis de insulina R basndose en la cantidad que est comiendo  [employer] niveles de azcar en la sangre al despertar 4 veces por semana  Tambin revise los niveles de azcar en la sangre aproximadamente 2 horas despus de una comida y haga esto despus de diferentes comidas por rotacin  Los niveles recomendados de azcar en la sangre al despertar son 90-130 y aproximadamente 2 horas despus de la comida es de 130-160  Por favor traiga su monitor de [employer] a cada visita, gracias  Descubra la cobertura de seguro para el medidor de glucosa      [facility] 03/11/2017, 12:37 PM      No visits with results within 1 Week(s) from this visit.  Latest known visit with results is:  Lab on 01/25/2017  Component Date Value Ref Range Status  . Hgb A1c MFr Bld 01/25/2017 8.3* 4.6 - 6.5 % Final  . Sodium 01/25/2017 141  135 - 145 mEq/L Final  . Potassium 01/25/2017 4.3  3.5 - 5.1 mEq/L Final  . Chloride 01/25/2017 105  96 - 112 mEq/L Final  . CO2 01/25/2017 29  19 - 32 mEq/L Final  . Glucose, Bld 01/25/2017 124* 70 - 99 mg/dL Final  . BUN 16/08/9603 20  6 - 23 mg/dL Final  . Creatinine, Ser 01/25/2017 0.70  0.40 - 1.20 mg/dL Final  . Total Bilirubin 01/25/2017 0.6  0.2 - 1.2 mg/dL Final  . Alkaline Phosphatase 01/25/2017 83  39 - 117  U/L Final  . AST 01/25/2017 26  0 - 37 U/L Final  . ALT 01/25/2017 22  0 - 35 U/L Final  . Total Protein 01/25/2017 7.4  6.0 - 8.3 g/dL Final  . Albumin 91/47/8295 4.4  3.5 - 5.2 g/dL Final  . Calcium 62/13/0865 9.9  8.4 - 10.5 mg/dL Final  . GFR 78/46/9629 93.93  >60.00 mL/min Final

## 2017-04-02 ENCOUNTER — Encounter: Payer: BLUE CROSS/BLUE SHIELD | Admitting: Nutrition

## 2017-06-11 ENCOUNTER — Other Ambulatory Visit: Payer: BLUE CROSS/BLUE SHIELD

## 2017-06-17 ENCOUNTER — Ambulatory Visit: Payer: BLUE CROSS/BLUE SHIELD | Admitting: Endocrinology

## 2017-07-11 ENCOUNTER — Other Ambulatory Visit: Payer: Self-pay | Admitting: Endocrinology

## 2017-07-22 ENCOUNTER — Other Ambulatory Visit: Payer: BLUE CROSS/BLUE SHIELD

## 2017-07-23 ENCOUNTER — Other Ambulatory Visit: Payer: BLUE CROSS/BLUE SHIELD

## 2017-07-25 ENCOUNTER — Other Ambulatory Visit (INDEPENDENT_AMBULATORY_CARE_PROVIDER_SITE_OTHER): Payer: BLUE CROSS/BLUE SHIELD

## 2017-07-25 DIAGNOSIS — R946 Abnormal results of thyroid function studies: Secondary | ICD-10-CM

## 2017-07-25 DIAGNOSIS — E1065 Type 1 diabetes mellitus with hyperglycemia: Secondary | ICD-10-CM | POA: Diagnosis not present

## 2017-07-25 DIAGNOSIS — R7989 Other specified abnormal findings of blood chemistry: Secondary | ICD-10-CM

## 2017-07-25 LAB — T3, FREE: T3 FREE: 3.2 pg/mL (ref 2.3–4.2)

## 2017-07-25 LAB — MICROALBUMIN / CREATININE URINE RATIO
CREATININE, U: 126.7 mg/dL
MICROALB/CREAT RATIO: 1.3 mg/g (ref 0.0–30.0)
Microalb, Ur: 1.7 mg/dL (ref 0.0–1.9)

## 2017-07-25 LAB — TSH: TSH: 0.26 u[IU]/mL — AB (ref 0.35–4.50)

## 2017-07-25 LAB — GLUCOSE, RANDOM: Glucose, Bld: 92 mg/dL (ref 70–99)

## 2017-07-25 LAB — HEMOGLOBIN A1C: Hgb A1c MFr Bld: 9.7 % — ABNORMAL HIGH (ref 4.6–6.5)

## 2017-07-28 NOTE — Progress Notes (Signed)
Patient ID: Gabrielle Waller, female   DOB: 06-Dec-1965, 51 y.o.   MRN: [id number]   Reason for Appointment : Follow up for Type 1 Diabetes  History of Present Illness          Diagnosis: Type 1 diabetes mellitus, date of diagnosis: 2001         Past history: Her blood sugar at diagnosis was 1056 and was started on insulin She is typically having difficulty controlling her diabetes because of cost of her medications and compliance She is also having relatively labile blood sugars Has difficulty understanding instructions for day-to-day management for diabetes and although she claims to be compliant with her insulin doses as prescribed she does not often check her blood sugars as directed and not clear how her diet affects her blood sugars   Recent history:   INSULIN regimen is described as: Toujeo 34 q am; regular insulin 20 units at meals  History was obtained with the help of the interpreter   Her blood sugars have been consistently difficult to control and A1c has been usually 9% or more, now 9.7  She is still using a [employer] brand meter and was  not able to start on the freestyle Libre   Current problems and glucose  patterns:  She is having mostly high blood sugars throughout the day except an occasional good readings in the morning or before suppertime  After much discussion she now states that she is not always taking her insulin BEFORE eating and usually taking it 30-60 minutes after eating  Sometimes will miss her mealtime insulin also  She is not being addition to her diet and since she is working at [employer] she is usually eating hamburgers and not watching her fat intake  Even though she was previously taking 24 units of regular insulin she is taking less now  Still does not understand the need to adjust her mealtime doses based on what she is eating  If her sugar is significantly high she will take 30 units regular insulin and with this she had a low sugar  once; the next day her sugars were relatively good most of the day  No overnight hypoglycemia  She is now complaining about the cost of TOUJEO and cannot afford it  Also despite reminders she does not do any exercise   Glucose monitoring:  done  1-2 times a day         Glucometer: [employer] brand    Blood Glucose readings from download of monitor as below   Mean values apply above for all meters except median for One Touch  PRE-MEAL Fasting Lunch Dinner Bedtime Overall  Glucose range: 106-279 201,  73- 454  57-599   Meanmedian: 202    210  235     Self-care:  Meals: 2-3 meals per day.  Breakfast Usually none; lunch  12 PM and dinner 7  p.m.            Physical activity: exercise: None  Dietician visit: Most recent:. ?     She has seen diabetes educator several times      Wt Readings from Last 3 Encounters:  07/29/17 108 lb 3.2 oz (49.1 kg)  03/11/17 109 lb 12.8 oz (49.8 kg)  01/30/17 112 lb (50.8 kg)     Lab Results  Component Value Date   HGBA1C 9.7 (H) 07/25/2017   HGBA1C 8.3 (H) 01/25/2017   HGBA1C 8.1 09/10/2016   Lab Results  Component Value Date  MICROALBUR 1.7 07/25/2017   LDLCALC 136 (H) 09/05/2016   CREATININE 0.70 01/25/2017      Lab Results  Component Value Date   MICRALBCREAT 1.3 07/25/2017   MICRALBCREAT 4.4 12/02/2015     Allergies as of 07/29/2017      Reactions   Penicillins Itching      Medication List       Accurate as of 07/29/17  8:45 PM. Always use your most recent med list.          acetaminophen 325 MG tablet Commonly known as:  TYLENOL Take 650 mg by mouth every 6 (six) hours as needed.   aspirin 325 MG EC tablet Take 325 mg by mouth daily.   atorvastatin 80 MG tablet Commonly known as:  LIPITOR Take 1 tablet (80 mg total) by mouth daily.   FREESTYLE LIBRE SENSOR SYSTEM Misc 1 strip by Does not apply route every 14 (fourteen) days. Dx code- E10.65 NDC- 575-99-0000-19   glucose blood test strip Commonly known as:   ONE TOUCH ULTRA TEST Use to check blood sugar 4 times daily   insulin degludec 100 UNIT/ML Sopn FlexTouch Pen Commonly known as:  TRESIBA FLEXTOUCH Inject 0.32 mLs (32 Units total) into the skin daily.   insulin regular 100 units/mL injection Commonly known as:  NOVOLIN R RELION INJECT 24 UNITS SUBCUTANEOUSLY THREE TIMES DAILY BEFORE  MEALS   loratadine 10 MG tablet Commonly known as:  CLARITIN Take 1 tablet (10 mg total) by mouth daily.   TOUJEO SOLOSTAR 300 UNIT/ML Sopn Generic drug:  Insulin Glargine INJECT 34 UNITS SUBCUTANEOUSLY EVERY MORNING   Vitamin D (Ergocalciferol) 50000 units Caps capsule Commonly known as:  DRISDOL Take 50,000 Units by mouth every 7 (seven) days.            Discharge Care Instructions        Start     Ordered   07/29/17 0000  insulin degludec (TRESIBA FLEXTOUCH) 100 UNIT/ML SOPN FlexTouch Pen  Daily     07/29/17 0941   07/29/17 0000  Flu Vaccine QUAD 36+ mos IM     07/29/17 1016      Allergies:  Allergies  Allergen Reactions  . Penicillins Itching    Past Medical History:  Diagnosis Date  . Diabetes mellitus without complication (HCC)   . Kidney stones     Past Surgical History:  Procedure Laterality Date  . APPENDECTOMY      Family History  Problem Relation Age of Onset  . Diabetes Mother     Social History:  reports that she has never smoked. She has never used smokeless tobacco. She reports that she does not drink alcohol or use drugs.    Review of Systems:   She has a prior history of hyperthyroidism treated with Tapazole in 2001.   She has had A variably low TSH consistently and normal free T4 and T3 For quite some time   Lab Results  Component Value Date   FREET4 0.69 11/09/2016   FREET4 0.65 09/05/2016   FREET4 0.72 03/01/2014   TSH 0.26 (L) 07/25/2017   TSH 0.09 (L) 09/05/2016   TSH 0.20 (L) 02/28/2016     She has had significant hyperlipidemia including high triglycerides. she is taking Lipitor 80  mg daily LDL is Below 100 on the last Panel  Lab Results  Component Value Date   CHOL 208 (H) 09/05/2016   HDL 52.30 09/05/2016   LDLCALC 136 (H) 09/05/2016   LDLDIRECT 81.0 11/09/2016  TRIG 98.0 09/05/2016   CHOLHDL 4 09/05/2016       Physical Examination:  BP 126/76   Pulse 87   Ht 5' (1.524 m)   Wt 108 lb 3.2 oz (49.1 kg)   SpO2 97%   BMI 21.13 kg/m         ASSESSMENT/PLAN:   Diabetes type 1:  See history of present illness for detailed discussion of his current management, blood sugar patterns and problems identified  A1c is now 9.7  She is still having the same issues with poor control related to inadequate mealtime insulin, for timing of insulin, sometimes missing her mealtime doses, eating either high carbohydrate or high-fat meals Also inadequate blood sugar testing with most readings at breakfast and bedtime only  Recommendations made today:  She will start taking insulin before eating, preferably 30 minutes before at least when she is home  She will only have grilled foods and cut back on fast food hamburgers  While finishing up her Toujeo she will go up to 36 units  Trial of TRESIBA since this may be better covered with her co-pay card  With this she will probably reduce her dose by 4 units to start with  For mealtime insulin she will go up 4 units for higher fat meals but not take more than 26 units at any time to avoid hypoglycemia and no insulin for correction at bedtime  Given her repeated instructions in Spanish  Encouraged to start walking for exercise  She needs more regular follow-up  Counseling time on subjects discussed in assessment and plan sections is over 50% of today's 25 minute visit    Patient Instructions  Less fried food, low fat meats  Must take Regular insulin BEFORE eating   Adjust insulin by 2-4 units based on type of food  No more than 26 units Regular insulin at any time  Toujeo 36 daily   Menos comida  frita, carnes bajas en grasa  Debe tomar insulina regular ANTES de comer  Ajuste la insulina en 2-4 unidades segn el tipo de alimento  No ms de 26 unidades de insulina regular en cualquier momento  Toujeo 36 todos los [place] 07/29/2017, 8:45 PM    Influenza vaccine given

## 2017-07-29 ENCOUNTER — Telehealth: Payer: Self-pay

## 2017-07-29 ENCOUNTER — Ambulatory Visit (INDEPENDENT_AMBULATORY_CARE_PROVIDER_SITE_OTHER): Payer: BLUE CROSS/BLUE SHIELD | Admitting: Endocrinology

## 2017-07-29 ENCOUNTER — Encounter: Payer: Self-pay | Admitting: Endocrinology

## 2017-07-29 VITALS — BP 126/76 | HR 87 | Ht 60.0 in | Wt 108.2 lb

## 2017-07-29 DIAGNOSIS — E1065 Type 1 diabetes mellitus with hyperglycemia: Secondary | ICD-10-CM

## 2017-07-29 DIAGNOSIS — R946 Abnormal results of thyroid function studies: Secondary | ICD-10-CM

## 2017-07-29 DIAGNOSIS — R7989 Other specified abnormal findings of blood chemistry: Secondary | ICD-10-CM

## 2017-07-29 DIAGNOSIS — Z23 Encounter for immunization: Secondary | ICD-10-CM

## 2017-07-29 MED ORDER — INSULIN DEGLUDEC 100 UNIT/ML ~~LOC~~ SOPN: 32.0000 [IU] | PEN_INJECTOR | Freq: Every day | SUBCUTANEOUS | 1 refills | Status: DC

## 2017-07-29 NOTE — Patient Instructions (Addendum)
Less fried food, low fat meats  Must take Regular insulin BEFORE eating   Adjust insulin by 2-4 units based on type of food  No more than 26 units Regular insulin at any time  Toujeo 36 daily   Menos comida frita, carnes bajas en grasa  Debe tomar insulina regular ANTES de comer  Ajuste la insulina en 2-4 unidades segn el tipo de alimento  No ms de 26 unidades de insulina regular en cualquier momento  Toujeo 36 [employer]

## 2017-07-29 NOTE — Telephone Encounter (Signed)
Called patient to let her know her blood glucose meter is up front for her to pick up. She stated she will come back to get it.

## 2017-08-20 ENCOUNTER — Other Ambulatory Visit: Payer: Self-pay | Admitting: Endocrinology

## 2017-10-30 ENCOUNTER — Other Ambulatory Visit: Payer: Self-pay | Admitting: Endocrinology

## 2017-10-31 ENCOUNTER — Encounter: Payer: Self-pay | Admitting: Endocrinology

## 2017-10-31 ENCOUNTER — Other Ambulatory Visit: Payer: BLUE CROSS/BLUE SHIELD

## 2017-11-01 ENCOUNTER — Other Ambulatory Visit: Payer: BLUE CROSS/BLUE SHIELD

## 2017-11-04 ENCOUNTER — Ambulatory Visit: Payer: BLUE CROSS/BLUE SHIELD | Admitting: Endocrinology

## 2017-11-19 DIAGNOSIS — Z30431 Encounter for routine checking of intrauterine contraceptive device: Secondary | ICD-10-CM | POA: Diagnosis not present

## 2017-11-19 DIAGNOSIS — Z113 Encounter for screening for infections with a predominantly sexual mode of transmission: Secondary | ICD-10-CM | POA: Diagnosis not present

## 2017-11-26 ENCOUNTER — Other Ambulatory Visit (INDEPENDENT_AMBULATORY_CARE_PROVIDER_SITE_OTHER): Payer: BLUE CROSS/BLUE SHIELD

## 2017-11-26 DIAGNOSIS — E1065 Type 1 diabetes mellitus with hyperglycemia: Secondary | ICD-10-CM | POA: Diagnosis not present

## 2017-11-26 LAB — LIPID PANEL
CHOLESTEROL: 135 mg/dL (ref 0–200)
HDL: 42.9 mg/dL (ref >39.00)
LDL Cholesterol: 75 mg/dL (ref 0–99)
NonHDL: 91.82
TRIGLYCERIDES: 82 mg/dL (ref 0.0–149.0)
Total CHOL/HDL Ratio: 3
VLDL: 16.4 mg/dL (ref 0.0–40.0)

## 2017-11-26 LAB — GLUCOSE, RANDOM: Glucose, Bld: 226 mg/dL — ABNORMAL HIGH (ref 70–99)

## 2017-11-26 LAB — HEMOGLOBIN A1C: HEMOGLOBIN A1C: 9.5 % — AB (ref 4.6–6.5)

## 2017-11-28 ENCOUNTER — Encounter: Payer: Self-pay | Admitting: Endocrinology

## 2017-11-28 ENCOUNTER — Ambulatory Visit: Payer: BLUE CROSS/BLUE SHIELD | Admitting: Endocrinology

## 2017-11-28 VITALS — BP 122/72 | HR 90 | Ht 60.0 in | Wt 106.0 lb

## 2017-11-28 DIAGNOSIS — E782 Mixed hyperlipidemia: Secondary | ICD-10-CM

## 2017-11-28 DIAGNOSIS — E1065 Type 1 diabetes mellitus with hyperglycemia: Secondary | ICD-10-CM | POA: Diagnosis not present

## 2017-11-28 NOTE — Progress Notes (Signed)
Patient ID: Gabrielle Waller, female   DOB: [birth year], 52 y.o.   MRN: [id number]   Reason for Appointment : Follow up for Type 1 Diabetes  History of Present Illness          Diagnosis: Type 1 diabetes mellitus, date of diagnosis: 2001         Past history: Her blood sugar at diagnosis was 1056 and was started on insulin She is typically having difficulty controlling her diabetes because of cost of her medications and compliance She is also having relatively labile blood sugars Has difficulty understanding instructions for day-to-day management for diabetes and although she claims to be compliant with her insulin doses as prescribed she does not often check her blood sugars as directed and not clear how her diet affects her blood sugars   Recent history:   INSULIN regimen is described as: Toujeo 34 q am; regular insulin 15--24--24 units at meals  History was obtained with the help of the interpreter   Her blood sugars have been consistently difficult to control and A1c has been usually 9% or more, now 9.5  She is still using a [employer] brand meter and this could not be downloaded today   Current problems and glucose  patterns:  She states that she is taking her mealtime insulin consistently even at lunch when she is at work but except for once her blood sugars at suppertime are consistently over 200 and as high as 370  Yesterday she had a [place] salad but did not take any coverage for this even though it had bean and corn  She has a few readings at bedtime also and except for once they are all significantly high also  FASTING blood sugars are mostly fairly good except for a couple of readings over 200 also  She tends to take some correction doses of regular insulin at bedtime, last night took 7 units  With this possibly her blood sugars may be low overnight, lowest reading 49 and this morning 66 at 5 AM  She was complaining about the cost of Toujeo but not clear why  she was not able to get coverage for Tresiba  Difficult to a good history today since we don't have an interpreter  Her weight is about the same   Glucose monitoring:  done  1-2 times a day         Glucometer: [employer] brand    Blood Glucose readings from download of monitor as below    Mean values apply above for all meters except median for One Touch  PRE-MEAL Fasting Lunch Dinner Bedtime Overall  Glucose range: 66-229   197-370  99-322    Mean/median:     187     Self-care:  Meals: 2-3 meals per day.  Breakfast Usually none; lunch  12 PM and dinner 7  p.m.            Physical activity: exercise: None  Dietician visit: Most recent:. ?     She has seen diabetes educator several times      Wt Readings from Last 3 Encounters:  11/28/17 106 lb (48.1 kg)  07/29/17 108 lb 3.2 oz (49.1 kg)  03/11/17 109 lb 12.8 oz (49.8 kg)     Lab Results  Component Value Date   HGBA1C 9.5 (H) 11/26/2017   HGBA1C 9.7 (H) 07/25/2017   HGBA1C 8.3 (H) 01/25/2017   Lab Results  Component Value Date   MICROALBUR 1.7 07/25/2017   LDLCALC  75 11/26/2017   CREATININE 0.70 01/25/2017      Lab Results  Component Value Date   MICRALBCREAT 1.3 07/25/2017   MICRALBCREAT 4.4 12/02/2015     Allergies as of 11/28/2017      Reactions   Penicillins Itching      Medication List        Accurate as of 11/28/17  8:08 AM. Always use your most recent med list.          acetaminophen 325 MG tablet Commonly known as:  TYLENOL Take 650 mg by mouth every 6 (six) hours as needed.   aspirin 325 MG EC tablet Take 325 mg by mouth daily.   atorvastatin 80 MG tablet Commonly known as:  LIPITOR TAKE ONE TABLET BY MOUTH ONCE DAILY   FREESTYLE LIBRE SENSOR SYSTEM Misc 1 strip by Does not apply route every 14 (fourteen) days. Dx code- E10.65 NDC- 575-99-0000-19   glucose blood test strip Commonly known as:  ONE TOUCH ULTRA TEST Use to check blood sugar 4 times daily   insulin degludec 100  UNIT/ML Sopn FlexTouch Pen Commonly known as:  TRESIBA FLEXTOUCH Inject 0.32 mLs (32 Units total) into the skin daily.   insulin regular 100 units/mL injection Commonly known as:  NOVOLIN R RELION INJECT 24 UNITS SUBCUTANEOUSLY THREE TIMES DAILY BEFORE  MEALS   NOVOLIN R RELION 100 units/mL injection Generic drug:  insulin regular INJECT 24 UNITS SUBCUTANEOUSLY THREE TIMES DAILY BEFORE MEALS   loratadine 10 MG tablet Commonly known as:  CLARITIN Take 1 tablet (10 mg total) by mouth daily.   TOUJEO SOLOSTAR 300 UNIT/ML Sopn Generic drug:  Insulin Glargine INJECT 34 UNITS SUBCUTANEOUSLY EVERY MORNING   Vitamin D (Ergocalciferol) 50000 units Caps capsule Commonly known as:  DRISDOL Take 50,000 Units by mouth every 7 (seven) days.       Allergies:  Allergies  Allergen Reactions  . Penicillins Itching    Past Medical History:  Diagnosis Date  . Diabetes mellitus without complication (HCC)   . Kidney stones     Past Surgical History:  Procedure Laterality Date  . APPENDECTOMY      Family History  Problem Relation Age of Onset  . Diabetes Mother     Social History:  reports that  has never smoked. she has never used smokeless tobacco. She reports that she does not drink alcohol or use drugs.    Review of Systems:   She has a prior history of hyperthyroidism treated with Tapazole in 2001.   TSH level usually is below normal, last 0.26   Lab Results  Component Value Date   FREET4 0.69 11/09/2016   FREET4 0.65 09/05/2016   FREET4 0.72 03/01/2014   TSH 0.26 (L) 07/25/2017   TSH 0.09 (L) 09/05/2016   TSH 0.20 (L) 02/28/2016     She has had significant hyperlipidemia including high triglycerides. she is taking Lipitor 80 mg daily LDL is excellent on the last evaluation:   Lab Results  Component Value Date   CHOL 135 11/26/2017   HDL 42.90 11/26/2017   LDLCALC 75 11/26/2017   LDLDIRECT 81.0 11/09/2016   TRIG 82.0 11/26/2017   CHOLHDL 3 11/26/2017     She went to a wellness clinic for Hispanic people and was asked to get an [facility] done, this was 110 last year   Physical Examination:  BP 122/72   Pulse 90   Ht 5' (1.524 m)   Wt 106 lb (48.1 kg)   SpO2 97%  BMI 20.70 kg/m       Exam not indicated  ASSESSMENT/PLAN:   Diabetes type 1:  See history of present illness for detailed discussion of his current management, blood sugar patterns and problems identified  A1c is now 9.5  Factors responsible for her poor control are likely to be skipping mealtime coverage at lunchtime although she thinks she is doing this Also she is likely not taking adequate insulin to cover her carbohydrates No has occasional nocturnal hypoglycemia possibly from taking correction doses of regular insulin late at night Fasting readings are fairly good about half the time with current regimen of Toujeo once a day in the morning, lab fasting glucose was 226 Also not checking blood sugars enough after breakfast or supper  Recommendations made today:  She will need to increase her mealtime insulin by 4 units at lunch and dinner for now, to take 28 units for most meals  Discussed timing of the insulin again for regular insulin  She will continue to try and avoid fast food hamburgers  Discussed that any foods including salads with carbohydrates will need to be covered with regular insulin but she can reduce the dose by 4 units for lower carbohydrate meals and take only 20 units  Reduce Toujeo down by 2 units to 32  Trial of TRESIBA since this may be better covered with her co-pay card  She does not need to take any correction doses of insulin at bedtime and she is using regular insulin  Given her instructions in Spanish  Follow-up in 3 months  LIPIDS: Well controlled  Menopause: She will discuss HRT with her wellness clinic Also she can discuss general screening such as colonoscopy with her PCP  Counseling time on subjects discussed in  assessment and plan sections is over 50% of today's 25 minute visit    There are no Patient Instructions on file for this visit.    Reather Littler 11/28/2017, 8:08 AM    Influenza vaccine given

## 2017-11-28 NOTE — Patient Instructions (Addendum)
Toujeo 32 units in am  Regular insulin 14 units at breakfast,   Take 28 units at lunch and dinner  For salad take 20 units and also if eating less starchy foods  No R insulin at bedtime  Toujeo 32 unidades en la maana.  Insulina regular 14 unidades en el desayuno,  Tomar 28 unidades en el almuerzo y Physicist, medicalla cena.  Para ensalada toma 20 unidades y tambin si comes menos alimentos ricos en almidn.  No hay insulina R a la hora de [employer]

## 2017-12-08 ENCOUNTER — Other Ambulatory Visit: Payer: Self-pay | Admitting: Endocrinology

## 2018-02-24 ENCOUNTER — Other Ambulatory Visit (INDEPENDENT_AMBULATORY_CARE_PROVIDER_SITE_OTHER): Payer: BLUE CROSS/BLUE SHIELD

## 2018-02-24 DIAGNOSIS — E1065 Type 1 diabetes mellitus with hyperglycemia: Secondary | ICD-10-CM

## 2018-02-24 LAB — GLUCOSE, RANDOM: Glucose, Bld: 136 mg/dL — ABNORMAL HIGH (ref 70–99)

## 2018-02-24 LAB — HEMOGLOBIN A1C: HEMOGLOBIN A1C: 9.4 % — AB (ref 4.6–6.5)

## 2018-02-26 ENCOUNTER — Ambulatory Visit: Payer: BLUE CROSS/BLUE SHIELD | Admitting: Endocrinology

## 2018-02-26 ENCOUNTER — Encounter: Payer: Self-pay | Admitting: Endocrinology

## 2018-02-26 ENCOUNTER — Other Ambulatory Visit: Payer: Self-pay | Admitting: Endocrinology

## 2018-02-26 VITALS — BP 132/82 | HR 81 | Ht 60.0 in | Wt 106.0 lb

## 2018-02-26 DIAGNOSIS — E1065 Type 1 diabetes mellitus with hyperglycemia: Secondary | ICD-10-CM

## 2018-02-26 MED ORDER — ATORVASTATIN CALCIUM 80 MG PO TABS: 80.0000 mg | ORAL_TABLET | Freq: Every day | ORAL | 4 refills | Status: DC

## 2018-02-26 NOTE — Patient Instructions (Signed)
Check sugar before each meal and bedtime  If sugar over 200 take extra 4-6 R insulin more  Egg or cheese at breakfast  Check blood sugars on waking up  daily  Also check blood sugars about 2 hours after a meal and do this after different meals by rotation  Recommended blood sugar levels before meals is 90-130 and about 2 hours after meal is 130-180  Please bring your blood sugar monitor to each visit, thank you

## 2018-02-26 NOTE — Progress Notes (Signed)
Patient ID: Gabrielle Waller, female   DOB: 1966-07-15, 52 y.o.   MRN: [id number]   Reason for Appointment : Follow up for Type 1 Diabetes  History of Present Illness          Diagnosis: Type 1 diabetes mellitus, date of diagnosis: 2001         Past history: Her blood sugar at diagnosis was 1056 and was started on insulin She is typically having difficulty controlling her diabetes because of cost of her medications and compliance She is also having relatively labile blood sugars Has difficulty understanding instructions for day-to-day management for diabetes and although she claims to be compliant with her insulin doses as prescribed she does not often check her blood sugars as directed and not clear how her diet affects her blood sugars   Recent history:   INSULIN regimen is described as: Toujeo 34 q am; regular insulin 20 units at meals  History was obtained from patient directly to the  Her blood sugars have been consistently difficult to control and A1c has been usually 9% or more, now 9.4  She is still using a [employer] brand meter   Current problems and glucose  patterns:  Her blood sugars are very inconsistent appear to be much higher in the last few days compared to earlier this month  She does not think she has been out of her insulin including the Toujeo and is taking it every morning  She also has had one low blood sugar overnight  However her blood sugars are being checked very sporadically  She has frequently high readings in the afternoon but variable before suppertime has only a couple of readings later in the evening also high She states that she is taking her mealtime insulin very consistently even when she is working but she is mostly taking 20 units most of the time and not adjusting it based on what her blood sugar is or what she is eating   Glucose monitoring:  done  1-2 times a day         Glucometer: [employer] brand    Blood Glucose readings from download  of monitor as below    Mean values apply above for all meters except median for One Touch  PRE-MEAL Fasting Lunch Dinner Bedtime Overall  Glucose range:  157-304      Mean/median: ?  179    205   POST-MEAL PC Breakfast PC Lunch PC Dinner  Glucose range:    7 3-459  Mean/median:    189    Self-care:  Meals: 2-3 meals per day.  Breakfast-8 AM; lunch  12 PM and dinner 7  p.m.            Physical activity: exercise: None   Dietician visit: Most recent:. ?     She has seen diabetes educator several times      Wt Readings from Last 3 Encounters:  02/26/18 106 lb (48.1 kg)  11/28/17 106 lb (48.1 kg)  07/29/17 108 lb 3.2 oz (49.1 kg)     Lab Results  Component Value Date   HGBA1C 9.4 (H) 02/24/2018   HGBA1C 9.5 (H) 11/26/2017   HGBA1C 9.7 (H) 07/25/2017   Lab Results  Component Value Date   MICROALBUR 1.7 07/25/2017   LDLCALC 75 11/26/2017   CREATININE 0.70 01/25/2017      Lab Results  Component Value Date   MICRALBCREAT 1.3 07/25/2017   MICRALBCREAT 4.4 12/02/2015     Allergies as  of 02/26/2018      Reactions   Penicillins Itching      Medication List        Accurate as of 02/26/18  1:37 PM. Always use your most recent med list.          acetaminophen 325 MG tablet Commonly known as:  TYLENOL Take 650 mg by mouth every 6 (six) hours as needed.   aspirin 325 MG EC tablet Take 325 mg by mouth daily.   atorvastatin 80 MG tablet Commonly known as:  LIPITOR Take 1 tablet (80 mg total) by mouth daily.   FREESTYLE LIBRE SENSOR SYSTEM Misc 1 strip by Does not apply route every 14 (fourteen) days. Dx code- E10.65 NDC- 575-99-0000-19   glucose blood test strip Commonly known as:  ONE TOUCH ULTRA TEST Use to check blood sugar 4 times daily   loratadine 10 MG tablet Commonly known as:  CLARITIN Take 1 tablet (10 mg total) by mouth daily.   NOVOLIN R RELION 100 units/mL injection Generic drug:  insulin regular INJECT 24 UNITS SUBCUTANEOUSLY THREE TIMES  DAILY BEFORE MEALS   TOUJEO SOLOSTAR 300 UNIT/ML Sopn Generic drug:  Insulin Glargine INJECT 34 UNITS SUBCUTANEOUSLY IN THE MORNING   Vitamin D (Ergocalciferol) 50000 units Caps capsule Commonly known as:  DRISDOL Take 50,000 Units by mouth every 7 (seven) days.       Allergies:  Allergies  Allergen Reactions  . Penicillins Itching    Past Medical History:  Diagnosis Date  . Diabetes mellitus without complication (HCC)   . Kidney stones     Past Surgical History:  Procedure Laterality Date  . APPENDECTOMY      Family History  Problem Relation Age of Onset  . Diabetes Mother     Social History:  reports that she has never smoked. She has never used smokeless tobacco. She reports that she does not drink alcohol or use drugs.    Review of Systems:   She has a prior history of hyperthyroidism treated with Tapazole in 2001.   TSH level usually is below normal, last 0.26   Lab Results  Component Value Date   FREET4 0.69 11/09/2016   FREET4 0.65 09/05/2016   FREET4 0.72 03/01/2014   TSH 0.26 (L) 07/25/2017   TSH 0.09 (L) 09/05/2016   TSH 0.20 (L) 02/28/2016     She has had significant hyperlipidemia including high triglycerides. she is taking Lipitor 80 mg daily LDL is excellent on the last evaluation:   Lab Results  Component Value Date   CHOL 135 11/26/2017   HDL 42.90 11/26/2017   LDLCALC 75 11/26/2017   LDLDIRECT 81.0 11/09/2016   TRIG 82.0 11/26/2017   CHOLHDL 3 11/26/2017       Physical Examination:  BP 132/82 (BP Location: Left Arm, Patient Position: Sitting, Cuff Size: Normal)   Pulse 81   Ht 5' (1.524 m)   Wt 106 lb (48.1 kg)   SpO2 99%   BMI 20.70 kg/m         ASSESSMENT/PLAN:   Diabetes type 1:  See history of present illness for detailed discussion of  current management, blood sugar patterns and problems identified  A1c is consistently over 9%   Although she had some good readings earlier this month she has mostly high  readings now and not clear  Usually she has some issues with compliance with insulin timing, diet Suspect she may not have been regular on her Toujeo though she says she has been  Usually has higher readings after meals and not taking consistently also She thinks her timing of the regular insulin is as prescribed  Recommendations made today:  She will need to check her sugars very consistently after all the meals  Also if her sugar is high before eating she will take additional insulin  She will need consistent amount of protein in the morning and not just have bread  She will need to increase her dose of regular insulin when she is eating larger meals or more carbohydrate  For now continue same dose of Toujeo since she has some relatively good readings and even an episode of low sugar overnight  Follow-up in 3 months  Also will need to follow-up with diabetes nurse if needed  LIPIDS: Well controlled previously and will continue the same medication, new prescription given      Patient Instructions  Check sugar before each meal and bedtime  If sugar over 200 take extra 4-6 R insulin more  Egg or cheese at breakfast  Check blood sugars on waking up  daily  Also check blood sugars about 2 hours after a meal and do this after different meals by rotation  Recommended blood sugar levels before meals is 90-130 and about 2 hours after meal is 130-180  Please bring your blood sugar monitor to each visit, thank you      Reather LittlerAjay Trini Christiansen 02/26/2018, 1:37 PM

## 2018-05-25 ENCOUNTER — Other Ambulatory Visit: Payer: Self-pay | Admitting: Endocrinology

## 2018-05-26 ENCOUNTER — Other Ambulatory Visit: Payer: BLUE CROSS/BLUE SHIELD

## 2018-05-30 ENCOUNTER — Ambulatory Visit: Payer: BLUE CROSS/BLUE SHIELD | Admitting: Endocrinology

## 2018-07-23 ENCOUNTER — Other Ambulatory Visit (INDEPENDENT_AMBULATORY_CARE_PROVIDER_SITE_OTHER): Payer: BLUE CROSS/BLUE SHIELD

## 2018-07-23 ENCOUNTER — Ambulatory Visit
Admission: RE | Admit: 2018-07-23 | Discharge: 2018-07-23 | Disposition: A | Discharge status: Home / Self Care | Payer: BLUE CROSS/BLUE SHIELD | Source: Ambulatory Visit | Attending: Endocrinology | Admitting: Endocrinology

## 2018-07-23 ENCOUNTER — Other Ambulatory Visit: Payer: Self-pay | Admitting: Endocrinology

## 2018-07-23 DIAGNOSIS — Z1231 Encounter for screening mammogram for malignant neoplasm of breast: Secondary | ICD-10-CM

## 2018-07-23 DIAGNOSIS — E1065 Type 1 diabetes mellitus with hyperglycemia: Secondary | ICD-10-CM | POA: Diagnosis not present

## 2018-07-23 LAB — BASIC METABOLIC PANEL
BUN: 19 mg/dL (ref 6–23)
CALCIUM: 9.7 mg/dL (ref 8.4–10.5)
CO2: 29 meq/L (ref 19–32)
CREATININE: 0.63 mg/dL (ref 0.40–1.20)
Chloride: 104 mEq/L (ref 96–112)
GFR: 105.45 mL/min (ref >60.00)
Glucose, Bld: 258 mg/dL — ABNORMAL HIGH (ref 70–99)
Potassium: 4.6 mEq/L (ref 3.5–5.1)
SODIUM: 140 meq/L (ref 135–145)

## 2018-07-23 LAB — HEMOGLOBIN A1C: Hgb A1c MFr Bld: 9.7 % — ABNORMAL HIGH (ref 4.6–6.5)

## 2018-07-23 LAB — LIPID PANEL
CHOL/HDL RATIO: 4
Cholesterol: 155 mg/dL (ref 0–200)
HDL: 41.3 mg/dL (ref >39.00)
LDL Cholesterol: 93 mg/dL (ref 0–99)
NONHDL: 113.85
Triglycerides: 104 mg/dL (ref 0.0–149.0)
VLDL: 20.8 mg/dL (ref 0.0–40.0)

## 2018-07-23 LAB — MICROALBUMIN / CREATININE URINE RATIO
CREATININE, U: 56.8 mg/dL
Microalb Creat Ratio: 2.5 mg/g (ref 0.0–30.0)
Microalb, Ur: 1.4 mg/dL (ref 0.0–1.9)

## 2018-07-28 ENCOUNTER — Encounter: Payer: Self-pay | Admitting: Endocrinology

## 2018-07-28 ENCOUNTER — Ambulatory Visit: Payer: BLUE CROSS/BLUE SHIELD | Admitting: Endocrinology

## 2018-07-28 VITALS — BP 122/80 | HR 86 | Ht <= 58 in | Wt 104.0 lb

## 2018-07-28 DIAGNOSIS — E1065 Type 1 diabetes mellitus with hyperglycemia: Secondary | ICD-10-CM | POA: Diagnosis not present

## 2018-07-28 DIAGNOSIS — R7989 Other specified abnormal findings of blood chemistry: Secondary | ICD-10-CM

## 2018-07-28 DIAGNOSIS — E782 Mixed hyperlipidemia: Secondary | ICD-10-CM | POA: Diagnosis not present

## 2018-07-28 NOTE — Patient Instructions (Addendum)
Must check 2 hrs after dinner  20-25 units   At lunch

## 2018-07-28 NOTE — Progress Notes (Signed)
Patient ID: Gabrielle ReelMaria Waller, female   DOB: 09-20-1966, 52 y.o.   MRN: [id number]   Reason for Appointment : Follow up for Type 1 Diabetes  History of Present Illness          Diagnosis: Type 1 diabetes mellitus, date of diagnosis: 2001         Past history: Her blood sugar at diagnosis was 1056 and was started on insulin She is typically having difficulty controlling her diabetes because of cost of her medications and compliance She is also having relatively labile blood sugars Has difficulty understanding instructions for day-to-day management for diabetes and although she claims to be compliant with her insulin doses as prescribed she does not often check her blood sugars as directed and not clear how her diet affects her blood sugars   Recent history:   INSULIN regimen is described as: Toujeo 32 q am; regular insulin 20 units at meals  History was obtained from patient through the interpreter  Her blood sugars have been consistently difficult to control   A1c has been usually 9% or more, now 9.7  She is still using a [employer] brand meter   Current problems and glucose  patterns:  She has checked her blood sugars only sporadically and mostly in the mornings  Also her meter is not programmed to the right time  Most of her blood sugars are relatively high in the morning although she claims that she had a low blood sugar at 2 AM which was not documented  As before her blood sugars around 4-5 PM are fairly consistently high in those she claims that she is taking her lunchtime insulin consistently  Her monitor could not be downloaded and blood sugar reviewed from meter memory  Has no readings after supper  She thinks she is taking her Toujeo regularly even though she thinks it is costing her $200 She had previously been recommended the freestyle libre sensor which she could not afford this or learn how to start this  Glucose monitoring:  done  1-2 times a day          Glucometer: [employer] brand    Blood Glucose readings from review of monitor as below     PRE-MEAL Fasting Lunch Dinner Bedtime Overall  Glucose range: 155-240 83 231-326    Mean/median: 170     243   POST-MEAL PC Breakfast PC Lunch PC Dinner  Glucose range:   ?  Mean/median:      Previous readings  PRE-MEAL Fasting Lunch Dinner Bedtime Overall  Glucose range:  157-304      Mean/median: ?  179    205   POST-MEAL PC Breakfast PC Lunch PC Dinner  Glucose range:    7 3-459  Mean/median:    189    Self-care:  Meals: 2-3 meals per day.  Breakfast-8 AM; lunch  12 PM and dinner 7  p.m.            Physical activity: exercise: None   Dietician visit: Most recent:. ?     She has seen diabetes educator several times      Wt Readings from Last 3 Encounters:  07/28/18 104 lb (47.2 kg)  02/26/18 106 lb (48.1 kg)  11/28/17 106 lb (48.1 kg)     Lab Results  Component Value Date   HGBA1C 9.7 (H) 07/23/2018   HGBA1C 9.4 (H) 02/24/2018   HGBA1C 9.5 (H) 11/26/2017   Lab Results  Component Value Date  MICROALBUR 1.4 07/23/2018   LDLCALC 93 07/23/2018   CREATININE 0.63 07/23/2018      Lab Results  Component Value Date   MICRALBCREAT 2.5 07/23/2018   MICRALBCREAT 1.3 07/25/2017     Allergies as of 07/28/2018      Reactions   Penicillins Itching      Medication List        Accurate as of 07/28/18 11:59 PM. Always use your most recent med list.          acetaminophen 325 MG tablet Commonly known as:  TYLENOL Take 650 mg by mouth every 6 (six) hours as needed.   aspirin 325 MG EC tablet Take 325 mg by mouth daily.   atorvastatin 80 MG tablet Commonly known as:  LIPITOR Take 1 tablet (80 mg total) by mouth daily.   FREESTYLE LIBRE SENSOR SYSTEM Misc 1 strip by Does not apply route every 14 (fourteen) days. Dx code- E10.65 NDC- 575-99-0000-19   glucose blood test strip Use to check blood sugar 4 times daily   loratadine 10 MG tablet Commonly known as:   CLARITIN Take 1 tablet (10 mg total) by mouth daily.   NOVOLIN R RELION 100 units/mL injection Generic drug:  insulin regular INJECT 24 UNITS SUBCUTANEOUSLY THREE TIMES DAILY BEFORE MEALS   TOUJEO SOLOSTAR 300 UNIT/ML Sopn Generic drug:  Insulin Glargine INJECT 34 UNITS SUBCUTANEOUSLY IN THE MORNING   Vitamin D (Ergocalciferol) 50000 units Caps capsule Commonly known as:  DRISDOL Take 50,000 Units by mouth every 7 (seven) days.       Allergies:  Allergies  Allergen Reactions  . Penicillins Itching    Past Medical History:  Diagnosis Date  . Diabetes mellitus without complication (HCC)   . Kidney stones     Past Surgical History:  Procedure Laterality Date  . APPENDECTOMY      Family History  Problem Relation Age of Onset  . Diabetes Mother     Social History:  reports that she has never smoked. She has never used smokeless tobacco. She reports that she does not drink alcohol or use drugs.    Review of Systems:   She has a prior history of hyperthyroidism treated with Tapazole in 2001.   TSH level usually is below normal, last 0.26   Lab Results  Component Value Date   FREET4 0.69 11/09/2016   FREET4 0.65 09/05/2016   FREET4 0.72 03/01/2014   TSH 0.26 (L) 07/25/2017   TSH 0.09 (L) 09/05/2016   TSH 0.20 (L) 02/28/2016     She has had significant hyperlipidemia at baseline including high triglycerides. she is taking Lipitor 80 mg daily LDL is excellent  the recent evaluation:   Lab Results  Component Value Date   CHOL 155 07/23/2018   HDL 41.30 07/23/2018   LDLCALC 93 07/23/2018   LDLDIRECT 81.0 11/09/2016   TRIG 104.0 07/23/2018   CHOLHDL 4 07/23/2018       Physical Examination:  BP 122/80   Pulse 86   Ht 4\' 10"  (1.473 m)   Wt 104 lb (47.2 kg)   SpO2 97%   BMI 21.74 kg/m         ASSESSMENT/PLAN:   Diabetes type 1:  See history of present illness for detailed discussion of  current management, blood sugar patterns and problems  identified  A1c is consistently over 9%   She is still having nightly significant high postprandial readings and requiring large doses of mealtime coverage Although she thinks she is taking  her mealtime insulin consistently not clear if she does this at lunchtime when she is working has her blood sugars are usually well over 200 at suppertime She still wants to use a generic regular insulin with a syringe Fasting readings are modestly increased not able to increase her dose because of reportedly occasional low sugars overnight Also not clear if she has high readings after supper when she does not monitor I am waiting to  Recommendations made today:  She we will need to check her sugars after supper more consistently  Also on her off days check blood sugars couple of hours after breakfast or lunch  She needs to take at least 20 units and preferably 25 units at lunchtime when eating a full meal  Co-pay card for Toujeo given to offset her cost, not clear if she may be occasionally skipping the dose  If her sugars are consistently over 200 after supper she will need to use 25 units at suppertime also  Overall needs to check blood sugars more often  Given her flowsheet to keep a record of her blood sugars for review next time  Urine microalbumin normal  LIPIDS: Well controlled   History of thyroid disease: Recheck TSH on the next visit  Counseling time on subjects discussed in assessment and plan sections is over 50% of today's 25 minute visit    Patient Instructions  Must check 2 hrs after dinner  20-25 units   At lunch     Reather Littler 07/29/2018, 9:25 AM

## 2018-09-09 ENCOUNTER — Other Ambulatory Visit: Payer: Self-pay | Admitting: Endocrinology

## 2018-10-22 ENCOUNTER — Other Ambulatory Visit (INDEPENDENT_AMBULATORY_CARE_PROVIDER_SITE_OTHER): Payer: BLUE CROSS/BLUE SHIELD

## 2018-10-22 DIAGNOSIS — E1065 Type 1 diabetes mellitus with hyperglycemia: Secondary | ICD-10-CM | POA: Diagnosis not present

## 2018-10-22 DIAGNOSIS — R7989 Other specified abnormal findings of blood chemistry: Secondary | ICD-10-CM | POA: Diagnosis not present

## 2018-10-22 LAB — TSH: TSH: 0.16 u[IU]/mL — AB (ref 0.35–4.50)

## 2018-10-22 LAB — HEMOGLOBIN A1C: Hgb A1c MFr Bld: 9 % — ABNORMAL HIGH (ref 4.6–6.5)

## 2018-10-27 ENCOUNTER — Encounter: Payer: Self-pay | Admitting: Endocrinology

## 2018-10-27 ENCOUNTER — Ambulatory Visit: Payer: BLUE CROSS/BLUE SHIELD | Admitting: Endocrinology

## 2018-10-27 VITALS — BP 140/80 | HR 80 | Ht <= 58 in | Wt 108.0 lb

## 2018-10-27 DIAGNOSIS — R7989 Other specified abnormal findings of blood chemistry: Secondary | ICD-10-CM | POA: Diagnosis not present

## 2018-10-27 DIAGNOSIS — E782 Mixed hyperlipidemia: Secondary | ICD-10-CM | POA: Diagnosis not present

## 2018-10-27 DIAGNOSIS — Z23 Encounter for immunization: Secondary | ICD-10-CM | POA: Diagnosis not present

## 2018-10-27 DIAGNOSIS — E1065 Type 1 diabetes mellitus with hyperglycemia: Secondary | ICD-10-CM

## 2018-10-27 NOTE — Patient Instructions (Signed)
R insulin 18 for small meals and 24 for large meals  More testing at 7-9 pm  Check twice daily  R insulina 18 para comidas pequeas y 24 para comidas grandes  Ms Gabrielle Nestlepruebas a las 7-9 pm  [employer] Communications[employer]ifique dos veces al [employer]

## 2018-10-27 NOTE — Progress Notes (Signed)
Patient ID: Gabrielle Waller, female   DOB: 1966/04/08, 52 y.o.   MRN: [id number]   Reason for Appointment : Follow up for Type 1 Diabetes  History of Present Illness          Diagnosis: Type 1 diabetes mellitus, date of diagnosis: 2001         Past history: Her blood sugar at diagnosis was 1056 and was started on insulin She is typically having difficulty controlling her diabetes because of cost of her medications and compliance She is also having relatively labile blood sugars Has difficulty understanding instructions for day-to-day management for diabetes and although she claims to be compliant with her insulin doses as prescribed she does not often check her blood sugars as directed and not clear how her diet affects her blood sugars   Recent history:   INSULIN regimen is described as: Toujeo 32 at 6 am; regular insulin 20-24 units at meals  History was obtained from patient through the interpreter  Her blood sugars have been consistently difficult to control   A1c has been usually 9% or more, now 9.0  She is still using a [employer] brand meter   Current problems and glucose  patterns:  Blood sugars were reviewed from her monitor directly as again she is using a generic monitor and not keeping a written record  As before she has only a few readings during the day and mostly checking in the morning at different times  She has half of her recent readings normal in the mornings and the others over 200  Today her blood sugar was likely higher from her eating pizza last night and not taking more than 20 units coverage  HYPOGLYCEMIA has been occurring occasionally either a couple of hours after supper or bedtime but she did not call about this  Recent lowest blood sugar 52 at about 11 PM  However she still has a reading of 242 at bedtime once and does not adjust her insulin based on what she is eating  She says she is eating mostly 2 meals a day now with no breakfast She  thinks she is taking her Toujeo regularly on waking up  Glucose monitoring:  done  1-2 times a day         Glucometer: [employer] brand    Blood Glucose readings from review of monitor as below     PRE-MEAL Fasting Lunch Dinner Bedtime Overall  Glucose range: 73-295   149  52, 242   Mean/median:     ?   POST-MEAL PC Breakfast PC Lunch PC Dinner  Glucose range:   126   Mean/median:        Self-care:  Meals: 2-3 meals per day.  Breakfast missed; 8 AM; lunch  11 am and dinner 5  p.m.            Physical activity: exercise: None   Dietician visit: Most recent:. ?     She has seen diabetes educator several times in the past      Wt Readings from Last 3 Encounters:  10/27/18 108 lb (49 kg)  07/28/18 104 lb (47.2 kg)  02/26/18 106 lb (48.1 kg)     Lab Results  Component Value Date   HGBA1C 9.0 (H) 10/22/2018   HGBA1C 9.7 (H) 07/23/2018   HGBA1C 9.4 (H) 02/24/2018   Lab Results  Component Value Date   MICROALBUR 1.4 07/23/2018   LDLCALC 93 07/23/2018   CREATININE 0.63 07/23/2018  Lab Results  Component Value Date   MICRALBCREAT 2.5 07/23/2018   MICRALBCREAT 1.3 07/25/2017     Allergies as of 10/27/2018      Reactions   Penicillins Itching      Medication List       Accurate as of October 27, 2018  1:35 PM. Always use your most recent med list.        acetaminophen 325 MG tablet Commonly known as:  TYLENOL Take 650 mg by mouth every 6 (six) hours as needed.   aspirin 325 MG EC tablet Take 325 mg by mouth daily.   atorvastatin 80 MG tablet Commonly known as:  LIPITOR TAKE 1 TABLET BY MOUTH ONCE DAILY   glucose blood test strip Commonly known as:  ONE TOUCH ULTRA TEST Use to check blood sugar 4 times daily   loratadine 10 MG tablet Commonly known as:  CLARITIN Take 1 tablet (10 mg total) by mouth daily.   NOVOLIN R RELION 100 units/mL injection Generic drug:  insulin regular INJECT 24 UNITS SUBCUTANEOUSLY THREE TIMES DAILY BEFORE MEALS     TOUJEO SOLOSTAR 300 UNIT/ML Sopn Generic drug:  Insulin Glargine (1 Unit Dial) INJECT 34 UNITS SUBCUTANEOUSLY IN THE MORNING   Vitamin D (Ergocalciferol) 1.25 MG (50000 UT) Caps capsule Commonly known as:  DRISDOL Take 50,000 Units by mouth every 7 (seven) days.       Allergies:  Allergies  Allergen Reactions  . Penicillins Itching    Past Medical History:  Diagnosis Date  . Diabetes mellitus without complication (HCC)   . Kidney stones     Past Surgical History:  Procedure Laterality Date  . APPENDECTOMY      Family History  Problem Relation Age of Onset  . Diabetes Mother     Social History:  reports that she has never smoked. She has never used smokeless tobacco. She reports that she does not drink alcohol or use drugs.    Review of Systems:   She has a prior history of hyperthyroidism treated with Tapazole in 2001.   TSH level usually is below normal without increased T4 or T3 No recent palpitations but she thinks she is thinning of fatigue   Lab Results  Component Value Date   FREET4 0.69 11/09/2016   FREET4 0.65 09/05/2016   FREET4 0.72 03/01/2014   TSH 0.16 (L) 10/22/2018   TSH 0.26 (L) 07/25/2017   TSH 0.09 (L) 09/05/2016     She has had significant hyperlipidemia at baseline including high triglycerides. she is taking Lipitor 80 mg daily LDL is excellent on the recent evaluation:   Lab Results  Component Value Date   CHOL 155 07/23/2018   HDL 41.30 07/23/2018   LDLCALC 93 07/23/2018   LDLDIRECT 81.0 11/09/2016   TRIG 104.0 07/23/2018   CHOLHDL 4 07/23/2018       Physical Examination:  BP 140/80 (BP Location: Left Arm, Patient Position: Sitting, Cuff Size: Normal)   Pulse 80   Ht 4\' 10"  (1.473 m)   Wt 108 lb (49 kg)   SpO2 97%   BMI 22.57 kg/m         ASSESSMENT/PLAN:   Diabetes type 1:  See history of present illness for detailed discussion of  current management, blood sugar patterns and problems identified  A1c is  consistently around 9% or more   A1c is slightly better than usual  However she has variability in her blood sugars based on her diet She does not know how to  adjust her mealtime dose based on what she is eating Occasionally may tend to get hypoglycemia after dinner probably from lower carbohydrate meals However morning readings may be higher if eating a higher fat meals such as this morning She thinks she is taking her insulin as directed with the regular insulin 30-minute before eating  Recommendations made today: Again given her a flowsheet giving a diary of her blood sugars and insulin doses She does need to increase or decrease her regular insulin based on pre-meal blood sugar and what she is eating At dinnertime she can use  18 units for most meals unless having significant carbohydrate No change in Toujeo this time To call if she is having overnight hypoglycemia She needs to check her sugars more often especially after meals  LIPIDS: Well controlled previously and will recheck on the next visit   Low TSH: We will recheck and also to T4 and T3 levels on the next visit  Counseling time on subjects discussed in assessment and plan sections is over 50% of today's 25 minute visit  Influenza vaccine given She needs to establish with a PCP for general medical problems and any general symptoms   Patient Instructions  R insulin 18 for small meals and 24 for large meals  More testing at 7-9 pm  Check twice daily  R insulina 18 para comidas pequeas y 24 para comidas grandes  Ms Marissa Nestlepruebas a las 7-9 pm  [employer] dos veces al da      Reather Littlerjay Shelagh Rayman 10/27/2018, 1:35 PM

## 2018-11-06 ENCOUNTER — Other Ambulatory Visit: Payer: Self-pay | Admitting: Endocrinology

## 2018-12-22 ENCOUNTER — Other Ambulatory Visit: Payer: Self-pay | Admitting: Endocrinology

## 2019-01-14 ENCOUNTER — Other Ambulatory Visit: Payer: Self-pay | Admitting: Endocrinology

## 2019-01-26 ENCOUNTER — Other Ambulatory Visit: Payer: Self-pay

## 2019-01-26 ENCOUNTER — Other Ambulatory Visit (INDEPENDENT_AMBULATORY_CARE_PROVIDER_SITE_OTHER): Payer: BLUE CROSS/BLUE SHIELD

## 2019-01-26 ENCOUNTER — Telehealth: Payer: Self-pay | Admitting: Endocrinology

## 2019-01-26 DIAGNOSIS — E1065 Type 1 diabetes mellitus with hyperglycemia: Secondary | ICD-10-CM

## 2019-01-26 DIAGNOSIS — R7989 Other specified abnormal findings of blood chemistry: Secondary | ICD-10-CM

## 2019-01-26 DIAGNOSIS — E782 Mixed hyperlipidemia: Secondary | ICD-10-CM | POA: Diagnosis not present

## 2019-01-26 LAB — LIPID PANEL
CHOL/HDL RATIO: 4
CHOLESTEROL: 192 mg/dL (ref 0–200)
HDL: 48.1 mg/dL (ref >39.00)
LDL Cholesterol: 120 mg/dL — ABNORMAL HIGH (ref 0–99)
NonHDL: 143.77
TRIGLYCERIDES: 121 mg/dL (ref 0.0–149.0)
VLDL: 24.2 mg/dL (ref 0.0–40.0)

## 2019-01-26 LAB — T4, FREE: FREE T4: 0.7 ng/dL (ref 0.60–1.60)

## 2019-01-26 LAB — TSH: TSH: 0.33 u[IU]/mL — AB (ref 0.35–4.50)

## 2019-01-26 LAB — HEMOGLOBIN A1C: Hgb A1c MFr Bld: 8.9 % — ABNORMAL HIGH (ref 4.6–6.5)

## 2019-01-26 LAB — GLUCOSE, RANDOM: GLUCOSE: 133 mg/dL — AB (ref 70–99)

## 2019-01-26 LAB — T3, FREE: T3, Free: 3.5 pg/mL (ref 2.3–4.2)

## 2019-01-26 MED ORDER — TOUJEO SOLOSTAR 300 UNIT/ML ~~LOC~~ SOPN: 32.0000 [IU] | PEN_INJECTOR | Freq: Every day | SUBCUTANEOUS | 1 refills | Status: DC

## 2019-01-26 NOTE — Telephone Encounter (Signed)
RX sent

## 2019-01-26 NOTE — Telephone Encounter (Signed)
MEDICATION: TOUJEO SOLOSTAR 300 UNIT/ML SOPN  PHARMACY:   [facility] [address] ([address]), [place] - [address] [phone] (Phone) [phone] (Fax)    IS THIS A 90 DAY SUPPLY : Yes  IS PATIENT OUT OF MEDICATION: Yes  IF NOT; HOW MUCH IS LEFT: None  LAST APPOINTMENT DATE: @3 /09/2019  NEXT APPOINTMENT DATE:@3 /23/2020  DO WE HAVE YOUR PERMISSION TO LEAVE A DETAILED MESSAGE: Yes  OTHER COMMENTS:    **Let patient know to contact pharmacy at the end of the day to make sure medication is ready. **  ** Please notify patient to allow 48-72 hours to process**  **Encourage patient to contact the pharmacy for refills or they can request refills through [facility]**

## 2019-01-29 ENCOUNTER — Ambulatory Visit: Payer: BLUE CROSS/BLUE SHIELD | Admitting: Endocrinology

## 2019-01-29 ENCOUNTER — Other Ambulatory Visit: Payer: Self-pay | Admitting: Endocrinology

## 2019-01-29 ENCOUNTER — Other Ambulatory Visit: Payer: Self-pay

## 2019-01-29 ENCOUNTER — Encounter: Payer: Self-pay | Admitting: Endocrinology

## 2019-01-29 VITALS — BP 134/78 | HR 88 | Temp 97.7°F | Wt 109.8 lb

## 2019-01-29 DIAGNOSIS — E7800 Pure hypercholesterolemia, unspecified: Secondary | ICD-10-CM

## 2019-01-29 DIAGNOSIS — E1065 Type 1 diabetes mellitus with hyperglycemia: Secondary | ICD-10-CM | POA: Diagnosis not present

## 2019-01-29 NOTE — Patient Instructions (Addendum)
Check blood sugars on waking up 3-4 days a week  Also check blood sugars about 2 hours after meals and do this after different meals by rotation  Recommended blood sugar levels on waking up are 90-130 and about 2 hours after meal is 130-160  Please bring your blood sugar monitor to each visit, thank you  Call if sugars get low often    Controle los niveles de azcar en la sangre al despertarse 3-4 das a la [place].  Tambin revise el azcar en la sangre aproximadamente 2 horas despus de las comidas y hgalo despus de diferentes comidas por rotacin  Los niveles recomendados de azcar en la sangre al despertar son 90-130 y aproximadamente 2 horas despus de la comida es 130-160  Elsworth Soho su monitor de Production assistant, radio a cada visita, gracias.  Llame si los azcares bajan con frecuencia

## 2019-01-29 NOTE — Progress Notes (Signed)
Patient ID: Gabrielle Waller, female   DOB: 1966/01/10, 53 y.o.   MRN: [id number]   Reason for Appointment : Follow up for Type 1 Diabetes  History of Present Illness          Diagnosis: Type 1 diabetes mellitus, date of diagnosis: 2001         Past history: Her blood sugar at diagnosis was 1056 and was started on insulin She is typically having difficulty controlling her diabetes because of cost of her medications and compliance She is also having relatively labile blood sugars Has difficulty understanding instructions for day-to-day management for diabetes and although she claims to be compliant with her insulin doses as prescribed she does not often check her blood sugars as directed and not clear how her diet affects her blood sugars   Recent history:   INSULIN regimen is described as: Toujeo 32 at 6 am; regular insulin 20-24 units at meals  History was obtained from patient with assistance from the interpreter  Her blood sugars have been consistently difficult to control   A1c has been usually 9% or more, now 8.9  She is still using a [employer] brand meter that cannot be downloaded   Current problems and glucose  patterns:  Blood sugars were reviewed from her monitor without being able to download this  She has very few blood sugars being checked and still not checking blood sugars consistently before and after meals as directed  FASTING readings are quite variable and she does not know why  She thinks she is taking her Toujeo consistently every day although today is asking for a refill  She thinks she is only eating 2 meals a day usually and not eating breakfast in the morning  She does try to take her regular insulin before eating and thinks she is doing this consistently  Also unclear why she had a low blood sugar last night after supper when she had a blood sugar of 53, she thinks she had only 14 units of insulin instead of the usual 20 units to cover her quesadilla  She has random blood sugars later in the day with 1 normal and 1 high reading in the early afternoon May be having higher readings in the evenings if blood sugars are relatively lower in the morning the same day Also has no consistent high or low readings before dinnertime  Glucose monitoring:  done  1-2 times a day         Glucometer: [employer] brand    Blood Glucose readings from review of monitor as below   Average reading for 14 days   PRE-MEAL Fasting Lunch Dinner Bedtime Overall  Glucose range:  87-266   131-400+    Mean/median:      183   POST-MEAL PC Breakfast PC Lunch PC Dinner  Glucose range:   87, 244  53, 245  Mean/median:       Self-care:  Meals: 2-3 meals per day.  Breakfast missed; lunch  11 am and dinner 5  p.m.            Physical activity: exercise: None   Dietician visit: Most recent:. ?     She has seen diabetes educator several times in the past      Wt Readings from Last 3 Encounters:  01/29/19 109 lb 12.8 oz (49.8 kg)  10/27/18 108 lb (49 kg)  07/28/18 104 lb (47.2 kg)     Lab Results  Component Value Date  HGBA1C 8.9 (H) 01/26/2019   HGBA1C 9.0 (H) 10/22/2018   HGBA1C 9.7 (H) 07/23/2018   Lab Results  Component Value Date   MICROALBUR 1.4 07/23/2018   LDLCALC 120 (H) 01/26/2019   CREATININE 0.63 07/23/2018      Lab Results  Component Value Date   MICRALBCREAT 2.5 07/23/2018   MICRALBCREAT 1.3 07/25/2017     Allergies as of 01/29/2019      Reactions   Penicillins Itching      Medication List       Accurate as of January 29, 2019 12:58 PM. Always use your most recent med list.        acetaminophen 325 MG tablet Commonly known as:  TYLENOL Take 650 mg by mouth every 6 (six) hours as needed.   aspirin 325 MG EC tablet Take 325 mg by mouth daily.   atorvastatin 80 MG tablet Commonly known as:  LIPITOR TAKE 1 TABLET BY MOUTH ONCE DAILY   glucose blood test strip Commonly known as:  ONE TOUCH ULTRA TEST Use to check blood  sugar 4 times daily   insulin regular 100 units/mL injection Commonly known as:  NovoLIN R ReliOn Inject 0.18 mLs (18 Units total) into the skin 3 (three) times daily before meals. Insulin Reg should be given within 30 min of a meal.   loratadine 10 MG tablet Commonly known as:  CLARITIN Take 1 tablet (10 mg total) by mouth daily.   Toujeo SoloStar 300 UNIT/ML Sopn Generic drug:  Insulin Glargine (1 Unit Dial) Inject 32 Units into the skin daily.   Vitamin D (Ergocalciferol) 1.25 MG (50000 UT) Caps capsule Commonly known as:  DRISDOL Take 50,000 Units by mouth every 7 (seven) days.       Allergies:  Allergies  Allergen Reactions  . Penicillins Itching    Past Medical History:  Diagnosis Date  . Diabetes mellitus without complication (HCC)   . Kidney stones     Past Surgical History:  Procedure Laterality Date  . APPENDECTOMY      Family History  Problem Relation Age of Onset  . Diabetes Mother     Social History:  reports that she has never smoked. She has never used smokeless tobacco. She reports that she does not drink alcohol or use drugs.    Review of Systems:   She has a prior history of hyperthyroidism treated with Tapazole in 2001.   TSH level usually is below normal without increased T4 or T3, relatively better now   Lab Results  Component Value Date   FREET4 0.70 01/26/2019   FREET4 0.69 11/09/2016   FREET4 0.65 09/05/2016   TSH 0.33 (L) 01/26/2019   TSH 0.16 (L) 10/22/2018   TSH 0.26 (L) 07/25/2017     She has had significant hyperlipidemia at baseline including high triglycerides. she is taking Lipitor 80 mg LDL is 120 She may have missed her dose for 3 days prior to her labs   Lab Results  Component Value Date   CHOL 192 01/26/2019   HDL 48.10 01/26/2019   LDLCALC 120 (H) 01/26/2019   LDLDIRECT 81.0 11/09/2016   TRIG 121.0 01/26/2019   CHOLHDL 4 01/26/2019       Physical Examination:  BP 134/78 (BP Location: Left Arm,  Patient Position: Sitting, Cuff Size: Normal)   Pulse 88   Temp 97.7 F (36.5 C) (Oral)   Wt 109 lb 12.8 oz (49.8 kg)   SpO2 98%   BMI 22.95 kg/m  ASSESSMENT/PLAN:   Diabetes type 1:  See history of present illness for detailed discussion of  current management, blood sugar patterns and problems identified  A1c is consistently around 9% or more   Reviewed her day-to-day management and insulin regimen as well as compliance with taking her insulin She has no consistent patterns of high or low sugars Occasionally does adjust her mealtime dose based on what she is eating  Discussed that since she has no consistent pattern will not change her insulin However she probably needs to be reminded to take insulin before meals regardless of pre-meal blood sugar even if there is a low normal reading Also stressed need to start checking sugars much more frequently at least before her follow-up visit to include readings before and after eating both lunch and dinner  THYROID: She still has a low normal TSH without hyperthyroidism  LIPIDS: Reminded her not to skip her medication and refill on time as her LDL is higher possibly from not refilling the dose before her labs   Patient Instructions  Check blood sugars on waking up 3-4 days a week  Also check blood sugars about 2 hours after meals and do this after different meals by rotation  Recommended blood sugar levels on waking up are 90-130 and about 2 hours after meal is 130-160  Please bring your blood sugar monitor to each visit, thank you  Call if sugars get low often    Controle los niveles de azcar en la sangre al despertarse 3-4 das a la [place].  Tambin revise el azcar en la sangre aproximadamente 2 horas despus de las comidas y hgalo despus de diferentes comidas por rotacin  Los niveles recomendados de azcar en la sangre al despertar son 90-130 y aproximadamente 2 horas despus de la comida es 130-160  Elsworth Soho  su monitor de Production assistant, radio a cada visita, gracias.  Llame si los azcares bajan con frecuencia      Reather Littler 01/29/2019, 12:58 PM

## 2019-02-22 ENCOUNTER — Other Ambulatory Visit: Payer: Self-pay | Admitting: Endocrinology

## 2019-03-08 ENCOUNTER — Other Ambulatory Visit: Payer: Self-pay | Admitting: Endocrinology

## 2019-03-14 ENCOUNTER — Other Ambulatory Visit: Payer: Self-pay | Admitting: Endocrinology

## 2019-03-26 ENCOUNTER — Other Ambulatory Visit: Payer: Self-pay | Admitting: Endocrinology

## 2019-04-18 ENCOUNTER — Ambulatory Visit (HOSPITAL_COMMUNITY)
Admission: EM | Admit: 2019-04-18 | Discharge: 2019-04-18 | Disposition: A | Discharge status: Home / Self Care | Payer: BC Managed Care – PPO

## 2019-04-18 ENCOUNTER — Other Ambulatory Visit: Payer: Self-pay

## 2019-04-18 ENCOUNTER — Encounter (HOSPITAL_COMMUNITY): Payer: Self-pay | Admitting: Emergency Medicine

## 2019-04-18 DIAGNOSIS — J069 Acute upper respiratory infection, unspecified: Secondary | ICD-10-CM

## 2019-04-18 NOTE — ED Triage Notes (Addendum)
Per pt she has been having a little cough, congestion, body aches and fever since Thursday. Pt has no fever today. Pt is taking otc for fevers and cough

## 2019-04-18 NOTE — ED Provider Notes (Signed)
[facility]    CSN: [id number] Arrival date & time: 04/18/19  1024     History   Chief Complaint Chief Complaint  Patient presents with  . Fatigue  . Fever    HPI Gabrielle Waller is a 53 y.o. female.   Gabrielle Waller presents with complaints of body aches which started 2 days ago while at work. She works at [employer]. She noted she was sweating. Fatigued. She didn't check her temperature but felt like she had a fever. Did have a headache, this has resolved. Cough and runny nose. No nausea, vomiting or diarrhea. No loss of smell or taste. Decreased appetite. No sore throat or ear pain. Today feels generally improved. No known ill contacts. No one has been ill at work. Hasn't taken any medications. No known covid exposure. Denies shortness of breath . Denies chest pain . No rash. Hx of dm.    ROS per HPI, negative if not otherwise mentioned.      Past Medical History:  Diagnosis Date  . Diabetes mellitus without complication (HCC)   . Kidney stones     Patient Active Problem List   Diagnosis Date Noted  . Allergic rhinitis 03/08/2016  . Health care maintenance 03/08/2016  . Elevated blood pressure 03/08/2016  . Subclinical hyperthyroidism 07/16/2013  . Mixed hyperlipidemia 07/14/2013  . Uncontrolled type 1 diabetes mellitus (HCC) 07/10/2013    Past Surgical History:  Procedure Laterality Date  . APPENDECTOMY      OB History   No obstetric history on file.      Home Medications    Prior to Admission medications   Medication Sig Start Date End Date Taking? Authorizing Provider  acetaminophen (TYLENOL) 325 MG tablet Take 650 mg by mouth every 6 (six) hours as needed.    [provider]  aspirin 325 MG EC tablet Take 325 mg by mouth daily.    [provider]  atorvastatin (LIPITOR) 80 MG tablet Take 1 tablet by mouth once daily 03/26/19   Reather LittlerKumar, Ajay, MD  glucose blood (ONE TOUCH ULTRA TEST) test strip Use to check blood sugar 4 times daily  09/10/16   Reather LittlerKumar, Ajay, MD  loratadine (CLARITIN) 10 MG tablet Take 1 tablet (10 mg total) by mouth daily. 03/08/16   Veryl Speakalone, Gregory D, FNP  NOVOLIN R RELION 100 UNIT/ML injection INJECT 18 UNITS SUBCUTANEOUSLY THREE TIMES DAILY BEFORE MEAL(S) WITHIN  30  MINUTES  OF  A  MEAL 03/15/19   Reather LittlerKumar, Ajay, MD  TOUJEO SOLOSTAR 300 UNIT/ML SOPN INJECT 34 UNITS SUBCUTANEOUSLY IN THE MORNING. PLEASE MAKE FOLLOW UP APPT. 03/09/19   Reather LittlerKumar, Ajay, MD  Vitamin D, Ergocalciferol, (DRISDOL) 50000 units CAPS capsule Take 50,000 Units by mouth every 7 (seven) days.    [provider]    Family History Family History  Problem Relation Age of Onset  . Diabetes Mother     Social History Social History   Tobacco Use  . Smoking status: Never Smoker  . Smokeless tobacco: Never Used  Substance Use Topics  . Alcohol use: No  . Drug use: No     Allergies   Penicillins   Review of Systems Review of Systems   Physical Exam Triage Vital Signs ED Triage Vitals  Enc Vitals Group     BP 04/18/19 1119 122/72     Pulse Rate 04/18/19 1119 82     Resp 04/18/19 1119 16     Temp 04/18/19 1119 97.7 F (36.5 C)  Temp Source 04/18/19 1119 Temporal     SpO2 04/18/19 1119 98 %     Weight --      Height --      Head Circumference --      Peak Flow --      Pain Score 04/18/19 1121 2     Pain Loc --      Pain Edu? --      Excl. in [place]? --    No data found.  Updated Vital Signs BP 122/72 (BP Location: Right Arm)   Pulse 82   Temp 97.7 F (36.5 C) (Temporal)   Resp 16   SpO2 98%    Physical Exam Constitutional:      General: She is not in acute distress.    Appearance: She is well-developed.  Cardiovascular:     Rate and Rhythm: Normal rate.  Pulmonary:     Effort: Pulmonary effort is normal.  Skin:    General: Skin is warm and dry.  Neurological:     Mental Status: She is alert and oriented to person, place, and time.      UC Treatments / Results  Labs (all labs ordered are  listed, but only abnormal results are displayed) Labs Reviewed - No data to display  EKG None  Radiology No results found.  Procedures Procedures (including critical care time)  Medications Ordered in UC Medications - No data to display  Initial Impression / Assessment and Plan / UC Course  I have reviewed the triage vital signs and the nursing notes.  Pertinent labs & imaging results that were available during my care of the patient were reviewed by me and considered in my medical decision making (see chart for details).     Non toxic, afebrile. No increased work of breathing. Vitals stable here today. History and physical consistent with viral illness.  Works at [employer]. I do recommend covid testing for return to work purposes, with message sent for order. Return precautions provided. Patient verbalized understanding and agreeable to plan.   Final Clinical Impressions(s) / UC Diagnoses   Final diagnoses:  Viral URI     Discharge Instructions     Push fluids to ensure adequate hydration and keep secretions thin.  Tylenol and/or ibuprofen as needed for pain or fevers.  Due to your symptoms in presence of pandemic I do recommend Covid-19 testing. You will be called to set up an appointment time to be tested at our [facility]. Results take about 2-3 days.  Please self isolate until you receive this results.   If any worsening of symptoms- increased headache, weakness, shortness of breath , chest pain , or otherwise worsening please go to the ER.     ED Prescriptions    None     Controlled Substance Prescriptions [place] Controlled Substance Registry consulted? Not Applicable   Zigmund Gottron, NP 04/18/19 1323

## 2019-04-18 NOTE — Discharge Instructions (Addendum)
Push fluids to ensure adequate hydration and keep secretions thin.  Tylenol and/or ibuprofen as needed for pain or fevers.  Due to your symptoms in presence of pandemic I do recommend Covid-19 testing. You will be called to set up an appointment time to be tested at our [facility]. Results take about 2-3 days.  Please self isolate until you receive this results.   If any worsening of symptoms- increased headache, weakness, shortness of breath , chest pain , or otherwise worsening please go to the ER.

## 2019-04-20 ENCOUNTER — Telehealth: Payer: Self-pay

## 2019-04-20 DIAGNOSIS — Z20822 Contact with and (suspected) exposure to covid-19: Secondary | ICD-10-CM

## 2019-04-20 NOTE — Telephone Encounter (Signed)
Rec'd referral from Augusto Gamble, NP to schedule for COVID 19 testing.  Pt. Has had symptoms of cough, fever, runny nose and body aches.    PC to pt.  Appt. Scheduled for COVID 19 test on 6/16 @ 8:15 AM.  Spoke with pt's. Husband to schedule actual appt. Time.  Advised husband that pt. Should wear mask, drive up to the test site and remain in the car.  Verb. Understanding.  Agreed with plan.

## 2019-04-21 ENCOUNTER — Other Ambulatory Visit: Payer: BC Managed Care – PPO

## 2019-04-21 DIAGNOSIS — R6889 Other general symptoms and signs: Secondary | ICD-10-CM | POA: Diagnosis not present

## 2019-04-21 DIAGNOSIS — Z20822 Contact with and (suspected) exposure to covid-19: Secondary | ICD-10-CM

## 2019-04-22 ENCOUNTER — Other Ambulatory Visit: Payer: Self-pay | Admitting: Endocrinology

## 2019-04-22 LAB — NOVEL CORONAVIRUS, NAA: SARS-CoV-2, NAA: NOT DETECTED

## 2019-04-23 ENCOUNTER — Other Ambulatory Visit: Payer: Self-pay | Admitting: Endocrinology

## 2019-04-24 ENCOUNTER — Telehealth (HOSPITAL_COMMUNITY): Payer: Self-pay | Admitting: *Deleted

## 2019-04-24 ENCOUNTER — Ambulatory Visit (HOSPITAL_COMMUNITY)
Admission: EM | Admit: 2019-04-24 | Discharge: 2019-04-24 | Disposition: A | Discharge status: Home / Self Care | Payer: BC Managed Care – PPO | Attending: Internal Medicine | Admitting: Internal Medicine

## 2019-04-24 NOTE — ED Notes (Signed)
Pt presents for return to work note following negative Covid test.  Pt seen in [facility] 6/13 for onset of sxs 04/16/19.  Discussed with Rondel Oh, NP: pt does not need to be evaluated; may provide work note for 10 days out from onset of sxs as long as pt has had improvement of sxs.  Pt states she is feeling much better.

## 2019-04-27 ENCOUNTER — Telehealth (HOSPITAL_COMMUNITY): Payer: Self-pay | Admitting: Emergency Medicine

## 2019-04-27 NOTE — Telephone Encounter (Signed)
Pt called asking for work note saying covid was negative. Left note for her in filing cabinet.

## 2019-04-28 ENCOUNTER — Other Ambulatory Visit: Payer: Self-pay

## 2019-04-28 ENCOUNTER — Other Ambulatory Visit (INDEPENDENT_AMBULATORY_CARE_PROVIDER_SITE_OTHER): Payer: BC Managed Care – PPO

## 2019-04-28 DIAGNOSIS — E1065 Type 1 diabetes mellitus with hyperglycemia: Secondary | ICD-10-CM

## 2019-04-28 DIAGNOSIS — E7800 Pure hypercholesterolemia, unspecified: Secondary | ICD-10-CM

## 2019-04-28 LAB — GLUCOSE, RANDOM: Glucose, Bld: 148 mg/dL — ABNORMAL HIGH (ref 70–99)

## 2019-04-28 LAB — HEMOGLOBIN A1C: Hgb A1c MFr Bld: 8.1 % — ABNORMAL HIGH (ref 4.6–6.5)

## 2019-04-28 LAB — LDL CHOLESTEROL, DIRECT: Direct LDL: 82 mg/dL

## 2019-05-01 ENCOUNTER — Encounter: Payer: Self-pay | Admitting: Endocrinology

## 2019-05-01 ENCOUNTER — Other Ambulatory Visit: Payer: Self-pay

## 2019-05-01 ENCOUNTER — Ambulatory Visit: Payer: BC Managed Care – PPO | Admitting: Endocrinology

## 2019-05-01 VITALS — BP 142/80 | HR 88 | Temp 98.6°F | Ht <= 58 in | Wt 114.4 lb

## 2019-05-01 DIAGNOSIS — E1065 Type 1 diabetes mellitus with hyperglycemia: Secondary | ICD-10-CM

## 2019-05-01 DIAGNOSIS — E7800 Pure hypercholesterolemia, unspecified: Secondary | ICD-10-CM

## 2019-05-01 NOTE — Patient Instructions (Signed)
Gabrielle Waller 16 unidades am y pm con la insulina rpida. Verifique el azcar 4 veces al da durante 1 semana antes de la prxima visita

## 2019-05-01 NOTE — Progress Notes (Signed)
Patient ID: Gabrielle Waller, female   DOB: 12-Aug-1966, 53 y.o.   MRN: [id number]   Reason for Appointment : Follow up for Type 1 Diabetes  History of Present Illness          Diagnosis: Type 1 diabetes mellitus, date of diagnosis: 2001         Past history: Her blood sugar at diagnosis was 1056 and was started on insulin She is typically having difficulty controlling her diabetes because of cost of her medications and compliance She is also having relatively labile blood sugars Has difficulty understanding instructions for day-to-day management for diabetes and although she claims to be compliant with her insulin doses as prescribed she does not often check her blood sugars as directed and not clear how her diet affects her blood sugars   Recent history:   INSULIN regimen is described as: Toujeo 32 at 6 am; regular insulin 20-24 units at meals   A1c has been usually consistently high but now better at 8.1  She is still using a [employer] brand meter that cannot be downloaded Today's history was obtained without interpreter   Current problems and glucose  patterns:  Blood sugars were reviewed from her monitor, not being able to download this  She has been checking blood sugars only fasting and has only 2 readings recently later in the day  She had been previously told to check blood sugars multiple times at least before her follow-up  FASTING readings are again extremely variable but mostly significantly high and recently only 2 readings that are near normal; also lab glucose was 140 2 in the morning  She thinks she is taking her insulin doses as prescribed, both basal and mealtime insulin  She cannot explain why her sugar was 502 this morning even though she took 30 units of regular insulin in the evening when she was eating Cheetos  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 and on her off days she is trying to walk in the afternoons  However does not report significant or  frequent hypoglycemia at any time  She has only 1 reading after dinner and 1 before eating On her last visit she was told to take her pre-meal insulin regardless of whether the blood sugar was normal or not and she thinks she is doing so  Glucose monitoring:  done  1-2 times a day         Glucometer: [employer] brand    Blood Glucose readings from review of monitor as below   Average is for 30 days    PRE-MEAL Fasting Lunch Dinner Bedtime Overall  Glucose range:  66-502   89  82   Mean/median:      192    Previous readings: : PRE-MEAL Fasting Lunch Dinner Bedtime Overall  Glucose range:  87-266   131-400+    Mean/median:      183   POST-MEAL PC Breakfast PC Lunch PC Dinner  Glucose range:   87, 244  53, 245  Mean/median:       Self-care:  Meals: 2-3 meals per day.  Breakfast 7 am; lunch  11 am and dinner 5  p.m.            Physical activity: exercise: None   Dietician visit: Most recent:. ?     She has seen diabetes educator several times in the past      Wt Readings from Last 3 Encounters:  05/01/19 114 lb 6.4 oz (51.9  kg)  01/29/19 109 lb 12.8 oz (49.8 kg)  10/27/18 108 lb (49 kg)     Lab Results  Component Value Date   HGBA1C 8.1 (H) 04/28/2019   HGBA1C 8.9 (H) 01/26/2019   HGBA1C 9.0 (H) 10/22/2018   Lab Results  Component Value Date   MICROALBUR 1.4 07/23/2018   LDLCALC 120 (H) 01/26/2019   CREATININE 0.63 07/23/2018      Lab Results  Component Value Date   MICRALBCREAT 2.5 07/23/2018   MICRALBCREAT 1.3 07/25/2017     Allergies as of 05/01/2019      Reactions   Penicillins Itching      Medication List       Accurate as of May 01, 2019 11:10 AM. If you have any questions, ask your nurse or doctor.        acetaminophen 325 MG tablet Commonly known as: TYLENOL Take 650 mg by mouth every 6 (six) hours as needed.   aspirin 325 MG EC tablet Take 325 mg by mouth daily.   atorvastatin 80 MG tablet Commonly known as: LIPITOR Take 1 tablet by  mouth once daily   glucose blood test strip Commonly known as: ONE TOUCH ULTRA TEST Use to check blood sugar 4 times daily   loratadine 10 MG tablet Commonly known as: CLARITIN Take 1 tablet (10 mg total) by mouth daily.   NovoLIN R ReliOn 100 units/mL injection Generic drug: insulin regular INJECT 18 UNITS SUBCUTANEOUSLY THREE TIMES DAILY BEFORE MEAL(S) WITHIN  30  MINUTES  OF  A  MEAL   Toujeo SoloStar 300 UNIT/ML Sopn Generic drug: Insulin Glargine (1 Unit Dial) INJECT 34 UNITS SUBCUTANEOUSLY IN THE MORNING -PLEASE  MAKE  FOLLOW  UP  APPOINTMENT   Vitamin D (Ergocalciferol) 1.25 MG (50000 UT) Caps capsule Commonly known as: DRISDOL Take 50,000 Units by mouth every 7 (seven) days.       Allergies:  Allergies  Allergen Reactions  . Penicillins Itching    Past Medical History:  Diagnosis Date  . Diabetes mellitus without complication ([place])   . Kidney stones     Past Surgical History:  Procedure Laterality Date  . APPENDECTOMY      Family History  Problem Relation Age of Onset  . Diabetes Mother     Social History:  reports that she has never smoked. She has never used smokeless tobacco. She reports that she does not drink alcohol or use drugs.    Review of Systems:   She has a prior history of hyperthyroidism treated with Tapazole in 2001.   TSH level usually is below normal without increased T4 or T3, relatively better now   Lab Results  Component Value Date   FREET4 0.70 01/26/2019   FREET4 0.69 11/09/2016   FREET4 0.65 09/05/2016   TSH 0.33 (L) 01/26/2019   TSH 0.16 (L) 10/22/2018   TSH 0.26 (L) 07/25/2017     She has had significant hyperlipidemia at baseline including high triglycerides. she is taking Lipitor 80 mg LDL was 120 previously when she had missed her medication prior to the lab but now she says she is regular with this recently  LDL down to 82   Lab Results  Component Value Date   CHOL 192 01/26/2019   HDL 48.10 01/26/2019    LDLCALC 120 (H) 01/26/2019   LDLDIRECT 82.0 04/28/2019   TRIG 121.0 01/26/2019   CHOLHDL 4 01/26/2019       Physical Examination:  BP (!) 142/80 (BP Location: Left Arm,  Patient Position: Sitting, Cuff Size: Normal)   Pulse 88   Temp 98.6 F (37 C) (Oral)   Ht 4\' 10"  (1.473 m)   Wt 114 lb 6.4 oz (51.9 kg)   SpO2 99%   BMI 23.91 kg/m         ASSESSMENT/PLAN:   Diabetes type 1:  See history of present illness for detailed discussion of  current management, blood sugar patterns and problems identified  A1c is previously around 9% or more and now 8.1   Difficult to assess her blood sugar patterns and management because of poor history as well as likely inconsistent compliance with her insulin She is monitoring blood sugars mostly fasting and difficult to know why sugars at that time are inconsistent Even with her not reportedly forgetting any insulin doses or going off her diet yesterday her fasting was over 500 today  Also unable to download her generic meter to review the full months pattern  She is also not able to adjust her mealtime dose based on what she is eating Again discussed day-to-day management of her diabetes with diet, insulin timing and adjustment and timing and targets of blood sugar readings  Since it may be that her Toujeo is not lasting 24 hours she will try to split this to 16 units twice daily instead of 32 units in the morning Encouraged her to check blood sugars 4 times a day before her next visit tomorrow at least a week Instructions given in Spanish  LIPIDS: Better now with improved compliance  High normal blood pressure: We will continue to follow Recheck microalbumin on the next visit  Patient Instructions  Tome Toujeo 16 unidades am y pm con la insulina rpida. Verifique el azcar 4 veces al da durante 1 semana antes de la prxima visita     Reather Littlerjay Mylynn Dinh 05/01/2019, 11:10 AM

## 2019-05-26 ENCOUNTER — Other Ambulatory Visit: Payer: Self-pay

## 2019-05-26 ENCOUNTER — Other Ambulatory Visit: Payer: Self-pay | Admitting: Endocrinology

## 2019-05-26 MED ORDER — TOUJEO MAX SOLOSTAR 300 UNIT/ML ~~LOC~~ SOPN: 34.0000 [IU] | PEN_INJECTOR | Freq: Every day | SUBCUTANEOUS | 3 refills | Status: DC

## 2019-06-01 ENCOUNTER — Other Ambulatory Visit: Payer: Self-pay | Admitting: Endocrinology

## 2019-06-02 ENCOUNTER — Telehealth: Payer: Self-pay | Admitting: Endocrinology

## 2019-06-02 ENCOUNTER — Other Ambulatory Visit: Payer: Self-pay | Admitting: Endocrinology

## 2019-06-02 NOTE — Telephone Encounter (Signed)
Rx was already sent this morning

## 2019-06-02 NOTE — Telephone Encounter (Signed)
MEDICATION: Tujeo   PHARMACY:  [facility] on [address]  IS THIS A 90 DAY SUPPLY :   IS PATIENT OUT OF MEDICATION:   IF NOT; HOW MUCH IS LEFT:   LAST APPOINTMENT DATE: @7 /27/2020  NEXT APPOINTMENT DATE:@9 /24/2020  DO WE HAVE YOUR PERMISSION TO LEAVE A DETAILED MESSAGE: yes  OTHER COMMENTS:    **Let patient know to contact pharmacy at the end of the day to make sure medication is ready. **  ** Please notify patient to allow 48-72 hours to process**  **Encourage patient to contact the pharmacy for refills or they can request refills through [facility]**

## 2019-07-05 ENCOUNTER — Other Ambulatory Visit: Payer: Self-pay | Admitting: Endocrinology

## 2019-07-10 ENCOUNTER — Other Ambulatory Visit: Payer: Self-pay | Admitting: Endocrinology

## 2019-07-27 ENCOUNTER — Encounter: Payer: Self-pay | Admitting: Emergency Medicine

## 2019-07-27 ENCOUNTER — Other Ambulatory Visit: Payer: Self-pay

## 2019-07-27 ENCOUNTER — Ambulatory Visit
Admission: EM | Admit: 2019-07-27 | Discharge: 2019-07-27 | Disposition: A | Discharge status: Home / Self Care | Payer: BC Managed Care – PPO

## 2019-07-27 DIAGNOSIS — L723 Sebaceous cyst: Secondary | ICD-10-CM

## 2019-07-27 NOTE — ED Triage Notes (Addendum)
Pt presents to [facility] for assessment of a mass to her back x 1 year but has grown in size the past week and has become sensitive to touch and itchy.

## 2019-07-27 NOTE — ED Notes (Signed)
Patient able to ambulate independently  

## 2019-07-27 NOTE — ED Provider Notes (Signed)
[facility]    CSN: [id number] Arrival date & time: 07/27/19  1344      History   Chief Complaint Chief Complaint  Patient presents with  . Mass    HPI Gabrielle Waller is a 53 y.o. female with history of diabetes presenting for lesion on her back, left side.  This has been there for a year, though has grown a little bit over the last week and become more sensitive and pruritic.  Denies history of skin cancer, abscess, cyst.  Denies inciting event.  Has not tried nothing for it.    Past Medical History:  Diagnosis Date  . Diabetes mellitus without complication (HCC)   . Kidney stones     Patient Active Problem List   Diagnosis Date Noted  . Allergic rhinitis 03/08/2016  . Health care maintenance 03/08/2016  . Elevated blood pressure 03/08/2016  . Subclinical hyperthyroidism 07/16/2013  . Mixed hyperlipidemia 07/14/2013  . Uncontrolled type 1 diabetes mellitus (HCC) 07/10/2013    Past Surgical History:  Procedure Laterality Date  . APPENDECTOMY      OB History   No obstetric history on file.      Home Medications    Prior to Admission medications   Medication Sig Start Date End Date Taking? Authorizing Provider  acetaminophen (TYLENOL) 325 MG tablet Take 650 mg by mouth every 6 (six) hours as needed.    [provider]  aspirin 325 MG EC tablet Take 325 mg by mouth daily.    [provider]  atorvastatin (LIPITOR) 80 MG tablet Take 1 tablet by mouth once daily 07/13/19   Reather Littler, MD  glucose blood (ONE TOUCH ULTRA TEST) test strip Use to check blood sugar 4 times daily 09/10/16   Reather Littler, MD  Insulin Glargine, 2 Unit Dial, (TOUJEO MAX SOLOSTAR) 300 UNIT/ML SOPN Inject 34 Units into the skin daily. Inject 34 units under the skin once daily. 05/26/19   Reather Littler, MD  loratadine (CLARITIN) 10 MG tablet Take 1 tablet (10 mg total) by mouth daily. 03/08/16   Veryl Speak, FNP  NOVOLIN R RELION 100 UNIT/ML injection INJECT 18 UNITS  SUBCUTANEOUSLY THREE TIMES DAILY BEFORE MEAL(S) WITHIN  30  MINUTES  OF  A  MEAL 03/15/19   Reather Littler, MD  TOUJEO SOLOSTAR 300 UNIT/ML SOPN Inject 34 Units into the skin daily. 07/06/19   Reather Littler, MD  Vitamin D, Ergocalciferol, (DRISDOL) 50000 units CAPS capsule Take 50,000 Units by mouth every 7 (seven) days.    [provider]    Family History Family History  Problem Relation Age of Onset  . Diabetes Mother     Social History Social History   Tobacco Use  . Smoking status: Never Smoker  . Smokeless tobacco: Never Used  Substance Use Topics  . Alcohol use: No  . Drug use: No     Allergies   Penicillins   Review of Systems Review of Systems  Constitutional: Negative for fatigue and fever.  HENT: Negative for ear pain, sinus pain, sore throat and voice change.   Eyes: Negative for pain, redness and visual disturbance.  Respiratory: Negative for cough and shortness of breath.   Cardiovascular: Negative for chest pain and palpitations.  Gastrointestinal: Negative for abdominal pain, diarrhea and vomiting.  Musculoskeletal: Negative for arthralgias and myalgias.  Skin: Negative for rash and wound.  Neurological: Negative for syncope and headaches.     Physical Exam Triage Vital Signs ED Triage Vitals [07/27/19 1404]  Enc Vitals Group     BP (!) 156/84     Pulse Rate 86     Resp 18     Temp 98.6 F (37 C)     Temp Source Oral     SpO2 97 %     Weight      Height      Head Circumference      Peak Flow      Pain Score 0     Pain Loc      Pain Edu?      Excl. in [place]?    No data found.  Updated Vital Signs BP (!) 156/84 (BP Location: Left Arm)   Pulse 86   Temp 98.6 F (37 C) (Oral)   Resp 18   SpO2 97%   Visual Acuity Right Eye Distance:   Left Eye Distance:   Bilateral Distance:    Right Eye Near:   Left Eye Near:    Bilateral Near:     Physical Exam Constitutional:      General: She is not in acute distress. HENT:     Head:  Normocephalic and atraumatic.  Eyes:     General: No scleral icterus.    Pupils: Pupils are equal, round, and reactive to light.  Cardiovascular:     Rate and Rhythm: Normal rate.  Pulmonary:     Effort: Pulmonary effort is normal.  Skin:    Coloration: Skin is not jaundiced or pale.     Comments: 0.5 cm round, raised lesion with slight discoloration.  No fluctuance, warmth, tenderness to palpation.  23-gauge needle introduced, aspirated: Negative.  Pressure then applied which produced thick, white discharge consistent with sebaceous cyst.  Neurological:     Mental Status: She is alert and oriented to person, place, and time.      UC Treatments / Results  Labs (all labs ordered are listed, but only abnormal results are displayed) Labs Reviewed - No data to display  EKG   Radiology No results found.  Procedures Procedures (including critical care time)  Medications Ordered in UC Medications - No data to display  Initial Impression / Assessment and Plan / UC Course  I have reviewed the triage vital signs and the nursing notes.  Pertinent labs & imaging results that were available during my care of the patient were reviewed by me and considered in my medical decision making (see chart for details).     .  Sebaceous cyst Reviewed that sebaceous cyst will likely recur, provided dermatology contact information for reevaluation.  Low concern for malignancy at this time.  Return precautions discussed, patient verbalized understanding and is agreeable to plan. Final Clinical Impressions(s) / UC Diagnoses   Final diagnoses:  Sebaceous cyst     Discharge Instructions     Return for worsening pain, swelling, redness, fever.    ED Prescriptions    None     PDMP not reviewed this encounter.   Hall[place]Potvin, [place], [place] 07/27/19 1435

## 2019-07-27 NOTE — Discharge Instructions (Addendum)
Return for worsening pain, swelling, redness, fever.

## 2019-07-28 DIAGNOSIS — Z23 Encounter for immunization: Secondary | ICD-10-CM | POA: Diagnosis not present

## 2019-07-30 ENCOUNTER — Other Ambulatory Visit: Payer: Self-pay

## 2019-07-30 ENCOUNTER — Other Ambulatory Visit (INDEPENDENT_AMBULATORY_CARE_PROVIDER_SITE_OTHER): Payer: BC Managed Care – PPO

## 2019-07-30 DIAGNOSIS — E1065 Type 1 diabetes mellitus with hyperglycemia: Secondary | ICD-10-CM

## 2019-07-30 LAB — COMPREHENSIVE METABOLIC PANEL
ALT: 24 U/L (ref 0–35)
AST: 23 U/L (ref 0–37)
Albumin: 4.2 g/dL (ref 3.5–5.2)
Alkaline Phosphatase: 109 U/L (ref 39–117)
BUN: 12 mg/dL (ref 6–23)
CO2: 29 mEq/L (ref 19–32)
Calcium: 9.9 mg/dL (ref 8.4–10.5)
Chloride: 106 mEq/L (ref 96–112)
Creatinine, Ser: 0.68 mg/dL (ref 0.40–1.20)
GFR: 90.49 mL/min (ref >60.00)
Glucose, Bld: 219 mg/dL — ABNORMAL HIGH (ref 70–99)
Potassium: 4.6 mEq/L (ref 3.5–5.1)
Sodium: 139 mEq/L (ref 135–145)
Total Bilirubin: 0.4 mg/dL (ref 0.2–1.2)
Total Protein: 7 g/dL (ref 6.0–8.3)

## 2019-07-30 LAB — HEMOGLOBIN A1C: Hgb A1c MFr Bld: 8.1 % — ABNORMAL HIGH (ref 4.6–6.5)

## 2019-07-30 LAB — MICROALBUMIN / CREATININE URINE RATIO
Creatinine,U: 60.3 mg/dL
Microalb Creat Ratio: 1.5 mg/g (ref 0.0–30.0)
Microalb, Ur: 0.9 mg/dL (ref 0.0–1.9)

## 2019-08-02 NOTE — Progress Notes (Signed)
Patient ID: Gabrielle Waller, female   DOB: 30-Jan-1966, 53 y.o.   MRN: [id number]   Reason for Appointment : Follow up for Type 1 Diabetes  History of Present Illness          Diagnosis: Type 1 diabetes mellitus, date of diagnosis: 2001         Past history: Her blood sugar at diagnosis was 1056 and was started on insulin She is typically having difficulty controlling her diabetes because of cost of her medications and compliance She is also having relatively labile blood sugars Has difficulty understanding instructions for day-to-day management for diabetes and although she claims to be compliant with her insulin doses as prescribed she does not often check her blood sugars as directed and not clear how her diet affects her blood sugars   Recent history:   INSULIN regimen is described as: Toujeo 16 units twice daily; regular insulin 20 units at meals   A1c has been usually consistently high but now unchanged at 8.1  She is still using a [employer] brand meter that cannot be downloaded Today's history was obtained without interpreter   Current problems and glucose  patterns:  Blood sugars were reviewed from her monitor for the last few days  She thinks she may have had less hypoglycemia with splitting the Toujeo to twice a day  However difficult to get a consistent pattern on her blood sugars since she has variable readings at all times  She had a low blood sugar after dinner last week when she had only fish and salad and no carbohydrate  She does not adjust her regular insulin based on how much she is eating or the carbohydrate content Fasting readings are inconsistent and glucose was high in the lab also She was advised to use the freestyle libre but she thinks it was too expensive  Glucose monitoring:  done  1-2 times a day         Glucometer: [employer] brand    Blood Glucose readings from review of monitor as below   Average is for 30 days   PRE-MEAL Fasting Lunch Dinner  Bedtime Overall  Glucose range:  83-261   119  185, 260   Mean/median:      177   POST-MEAL PC Breakfast PC Lunch PC Dinner  Glucose range:   363  57, 90  Mean/median:      Previous readings:  PRE-MEAL Fasting Lunch Dinner Bedtime Overall  Glucose range:  66-502   89  82   Mean/median:      192       Self-care:  Meals: 2-3 meals per day.  Breakfast 7 am; lunch  11 am and dinner 5-6  p.m.            She is trying to eat more fish and chicken, she does work at [employer] visit: Most recent:. ?     She has seen diabetes educator several times in the past      Wt Readings from Last 3 Encounters:  08/03/19 114 lb 12.8 oz (52.1 kg)  05/01/19 114 lb 6.4 oz (51.9 kg)  01/29/19 109 lb 12.8 oz (49.8 kg)     Lab Results  Component Value Date   HGBA1C 8.1 (H) 07/30/2019   HGBA1C 8.1 (H) 04/28/2019   HGBA1C 8.9 (H) 01/26/2019   Lab Results  Component Value Date   MICROALBUR 0.9 07/30/2019   LDLCALC 120 (H) 01/26/2019   CREATININE 0.68 07/30/2019  Lab Results  Component Value Date   MICRALBCREAT 1.5 07/30/2019   MICRALBCREAT 2.5 07/23/2018     Allergies as of 08/03/2019      Reactions   Penicillins Itching      Medication List       Accurate as of August 03, 2019  9:33 AM. If you have any questions, ask your nurse or doctor.        acetaminophen 325 MG tablet Commonly known as: TYLENOL Take 650 mg by mouth every 6 (six) hours as needed.   aspirin 325 MG EC tablet Take 325 mg by mouth daily.   atorvastatin 80 MG tablet Commonly known as: LIPITOR Take 1 tablet by mouth once daily   glucose blood test strip Commonly known as: ONE TOUCH ULTRA TEST Use to check blood sugar 4 times daily   loratadine 10 MG tablet Commonly known as: CLARITIN Take 1 tablet (10 mg total) by mouth daily.   NovoLIN R ReliOn 100 units/mL injection Generic drug: insulin regular INJECT 18 UNITS SUBCUTANEOUSLY THREE TIMES DAILY BEFORE MEAL(S) WITHIN  30   MINUTES  OF  A  MEAL What changed: See the new instructions.   Toujeo SoloStar 300 UNIT/ML Sopn Generic drug: Insulin Glargine (1 Unit Dial) Inject 34 Units into the skin daily. What changed:   how much to take  Another medication with the same name was removed. Continue taking this medication, and follow the directions you see here.   Vitamin D (Ergocalciferol) 1.25 MG (50000 UT) Caps capsule Commonly known as: DRISDOL Take 50,000 Units by mouth every 7 (seven) days.       Allergies:  Allergies  Allergen Reactions  . Penicillins Itching    Past Medical History:  Diagnosis Date  . Diabetes mellitus without complication (HCC)   . Kidney stones     Past Surgical History:  Procedure Laterality Date  . APPENDECTOMY      Family History  Problem Relation Age of Onset  . Diabetes Mother     Social History:  reports that she has never smoked. She has never used smokeless tobacco. She reports that she does not drink alcohol or use drugs.    Review of Systems:   She has a prior history of hyperthyroidism treated with Tapazole in 2001.   TSH level usually is below normal without increased T4 or T3.   Lab Results  Component Value Date   FREET4 0.70 01/26/2019   FREET4 0.69 11/09/2016   FREET4 0.65 09/05/2016   TSH 0.33 (L) 01/26/2019   TSH 0.16 (L) 10/22/2018   TSH 0.26 (L) 07/25/2017     She has had significant hyperlipidemia at baseline including high triglycerides. she is taking Lipitor 80 mg   LDL as below   Lab Results  Component Value Date   CHOL 192 01/26/2019   HDL 48.10 01/26/2019   LDLCALC 120 (H) 01/26/2019   LDLDIRECT 82.0 04/28/2019   TRIG 121.0 01/26/2019   CHOLHDL 4 01/26/2019    She has had her influenza vaccine at the drugstore   Physical Examination:  BP 140/80 (BP Location: Left Arm, Patient Position: Sitting, Cuff Size: Normal)   Pulse 100   Ht 4\' 10"  (1.473 m)   Wt 114 lb 12.8 oz (52.1 kg)   SpO2 98%   BMI 23.99 kg/m          ASSESSMENT/PLAN:   Diabetes type 1:  See history of present illness for detailed discussion of  current management, blood sugar patterns and  problems identified  A1c is again 8.1 although better than previously  Her blood sugars were assessed only for the last week or so She has variable fasting readings Has a couple of relatively high readings at bedtime and also after lunch She cannot explain why her blood sugars fluctuate However she does not adjust her mealtime dose she is eating less or more carbohydrate This has caused at least one low blood sugar However probably had less hypoglycemia with using Toujeo twice a day  She was told to cut down to 15 units on her regular insulin when eating minimal carbohydrate Also she will have her husband check the cost of the freestyle libre sensor and also the preferred brand of glucose monitor  Microalbumin normal  There are no Patient Instructions on file for this visit.    Reather Littler 08/03/2019, 9:33 AM

## 2019-08-03 ENCOUNTER — Ambulatory Visit: Payer: BC Managed Care – PPO | Admitting: Endocrinology

## 2019-08-03 ENCOUNTER — Other Ambulatory Visit: Payer: Self-pay

## 2019-08-03 ENCOUNTER — Encounter: Payer: Self-pay | Admitting: Endocrinology

## 2019-08-03 VITALS — BP 140/80 | HR 100 | Ht <= 58 in | Wt 114.8 lb

## 2019-08-03 DIAGNOSIS — E1065 Type 1 diabetes mellitus with hyperglycemia: Secondary | ICD-10-CM | POA: Diagnosis not present

## 2019-08-03 DIAGNOSIS — E7800 Pure hypercholesterolemia, unspecified: Secondary | ICD-10-CM

## 2019-08-03 DIAGNOSIS — R7989 Other specified abnormal findings of blood chemistry: Secondary | ICD-10-CM | POA: Diagnosis not present

## 2019-08-03 MED ORDER — FREESTYLE LIBRE 14 DAY SENSOR MISC: 1.0000 [IU] | 4 refills | Status: DC

## 2019-08-03 NOTE — Patient Instructions (Addendum)
Will check with your insurance to see which brand of blood sugar meter is covered  I have sent a prescription for the freestyle libre sensor, you already have the meter.  Check to see what the cost is and let us know if you need any help starting this  Check blood sugars on waking up 5 days a week  Also check blood sugars about 2 hours after meals and do this after different meals by rotation  Recommended blood sugar levels on waking up are 90-130 and about 2 hours after meal is 130-160  Please bring your blood sugar monitor to each visit, thank you  If Blood sugar in the morning is always over 150 increase the evening Toujeo up to 18 units

## 2019-08-08 ENCOUNTER — Other Ambulatory Visit: Payer: Self-pay | Admitting: Endocrinology

## 2019-09-15 ENCOUNTER — Other Ambulatory Visit: Payer: Self-pay | Admitting: Internal Medicine

## 2019-10-17 ENCOUNTER — Other Ambulatory Visit: Payer: Self-pay | Admitting: Endocrinology

## 2019-10-19 ENCOUNTER — Other Ambulatory Visit: Payer: Self-pay

## 2019-10-19 ENCOUNTER — Other Ambulatory Visit: Payer: Self-pay | Admitting: Endocrinology

## 2019-10-19 MED ORDER — ATORVASTATIN CALCIUM 80 MG PO TABS: 80.0000 mg | ORAL_TABLET | Freq: Every day | ORAL | 0 refills | Status: DC

## 2019-10-20 ENCOUNTER — Other Ambulatory Visit: Payer: Self-pay

## 2019-10-20 DIAGNOSIS — E119 Type 2 diabetes mellitus without complications: Secondary | ICD-10-CM | POA: Diagnosis not present

## 2019-10-20 MED ORDER — INSULIN REGULAR HUMAN 100 UNIT/ML IJ SOLN: INTRAMUSCULAR | 3 refills | Status: DC

## 2019-10-20 MED ORDER — TOUJEO SOLOSTAR 300 UNIT/ML ~~LOC~~ SOPN: 16.0000 [IU] | PEN_INJECTOR | Freq: Two times a day (BID) | SUBCUTANEOUS | 2 refills | Status: DC

## 2019-10-21 ENCOUNTER — Telehealth: Payer: Self-pay | Admitting: Endocrinology

## 2019-10-21 NOTE — Telephone Encounter (Signed)
Medication was already sent

## 2019-10-21 NOTE — Telephone Encounter (Signed)
MEDICATION: Novolin R and Toujeo  PHARMACY:  [facility] on [address]  IS THIS A 90 DAY SUPPLY : no  IS PATIENT OUT OF MEDICATION:   IF NOT; HOW MUCH IS LEFT: Novolin 1 days worth, Toujeo 2-3 days worth  LAST APPOINTMENT DATE: @12 /15/2020  NEXT APPOINTMENT DATE:@12 /28/2020  DO WE HAVE YOUR PERMISSION TO LEAVE A DETAILED MESSAGE: yes  OTHER COMMENTS:    **Let patient know to contact pharmacy at the end of the day to make sure medication is ready. **  ** Please notify patient to allow 48-72 hours to process**  **Encourage patient to contact the pharmacy for refills or they can request refills through [facility]**

## 2019-10-22 ENCOUNTER — Other Ambulatory Visit: Payer: Self-pay

## 2019-10-22 MED ORDER — TOUJEO SOLOSTAR 300 UNIT/ML ~~LOC~~ SOPN: 16.0000 [IU] | PEN_INJECTOR | Freq: Two times a day (BID) | SUBCUTANEOUS | 2 refills | Status: DC

## 2019-10-22 MED ORDER — INSULIN REGULAR HUMAN 100 UNIT/ML IJ SOLN: INTRAMUSCULAR | 3 refills | Status: DC

## 2019-10-22 NOTE — Telephone Encounter (Signed)
Sent new Rx to [facility] [address] and [facility] since pt no longer uses this pharmacy.

## 2019-10-22 NOTE — Telephone Encounter (Signed)
Patient no longer using [facility] on [address]. Patient requests the RX's listed below:  insulin regular (NOVOLIN R RELION) 100 units/mL injection AND Insulin Glargine, 1 Unit Dial, (TOUJEO SOLOSTAR) 300 UNIT/ML SOPN   be sent asap to:   [facility] [phone] - [place], [address] AT [place] Phone:  [phone]  Fax:  [phone]

## 2019-10-23 ENCOUNTER — Other Ambulatory Visit: Payer: Self-pay

## 2019-10-23 MED ORDER — INSULIN REGULAR HUMAN 100 UNIT/ML IJ SOLN: INTRAMUSCULAR | 3 refills | Status: DC

## 2019-10-23 MED ORDER — TOUJEO SOLOSTAR 300 UNIT/ML ~~LOC~~ SOPN: 16.0000 [IU] | PEN_INJECTOR | Freq: Two times a day (BID) | SUBCUTANEOUS | 2 refills | Status: DC

## 2019-10-23 NOTE — Telephone Encounter (Signed)
Patient called back to advise that [facility] is too expensive and she needs prescriptions sent back to the [facility] in [place].  Phone Number for call back is [phone]

## 2019-10-26 ENCOUNTER — Other Ambulatory Visit: Payer: Self-pay

## 2019-10-26 MED ORDER — TOUJEO SOLOSTAR 300 UNIT/ML ~~LOC~~ SOPN: 16.0000 [IU] | PEN_INJECTOR | Freq: Two times a day (BID) | SUBCUTANEOUS | 2 refills | Status: DC

## 2019-10-26 MED ORDER — INSULIN REGULAR HUMAN 100 UNIT/ML IJ SOLN: INTRAMUSCULAR | 3 refills | Status: DC

## 2019-10-26 NOTE — Telephone Encounter (Signed)
[facility] to [facility]

## 2019-11-02 ENCOUNTER — Other Ambulatory Visit: Payer: Self-pay

## 2019-11-02 ENCOUNTER — Other Ambulatory Visit (INDEPENDENT_AMBULATORY_CARE_PROVIDER_SITE_OTHER): Payer: BC Managed Care – PPO

## 2019-11-02 DIAGNOSIS — E7800 Pure hypercholesterolemia, unspecified: Secondary | ICD-10-CM | POA: Diagnosis not present

## 2019-11-02 DIAGNOSIS — E1065 Type 1 diabetes mellitus with hyperglycemia: Secondary | ICD-10-CM | POA: Diagnosis not present

## 2019-11-02 DIAGNOSIS — R7989 Other specified abnormal findings of blood chemistry: Secondary | ICD-10-CM | POA: Diagnosis not present

## 2019-11-02 LAB — HEMOGLOBIN A1C: Hgb A1c MFr Bld: 8.2 % — ABNORMAL HIGH (ref 4.6–6.5)

## 2019-11-02 LAB — LDL CHOLESTEROL, DIRECT: Direct LDL: 105 mg/dL

## 2019-11-02 LAB — TSH: TSH: 0.33 u[IU]/mL — ABNORMAL LOW (ref 0.35–4.50)

## 2019-11-02 LAB — GLUCOSE, RANDOM: Glucose, Bld: 187 mg/dL — ABNORMAL HIGH (ref 70–99)

## 2019-11-02 LAB — T4, FREE: Free T4: 0.81 ng/dL (ref 0.60–1.60)

## 2019-11-05 ENCOUNTER — Other Ambulatory Visit: Payer: Self-pay

## 2019-11-05 ENCOUNTER — Encounter: Payer: Self-pay | Admitting: Endocrinology

## 2019-11-05 ENCOUNTER — Ambulatory Visit: Payer: BC Managed Care – PPO | Admitting: Endocrinology

## 2019-11-05 VITALS — BP 140/80 | HR 92 | Ht <= 58 in | Wt 113.0 lb

## 2019-11-05 DIAGNOSIS — E1065 Type 1 diabetes mellitus with hyperglycemia: Secondary | ICD-10-CM | POA: Diagnosis not present

## 2019-11-05 DIAGNOSIS — R7989 Other specified abnormal findings of blood chemistry: Secondary | ICD-10-CM

## 2019-11-05 DIAGNOSIS — E7800 Pure hypercholesterolemia, unspecified: Secondary | ICD-10-CM | POA: Diagnosis not present

## 2019-11-05 MED ORDER — GLUCOSE BLOOD VI STRP: ORAL_STRIP | 3 refills | Status: DC

## 2019-11-05 MED ORDER — ATORVASTATIN CALCIUM 80 MG PO TABS: 80.0000 mg | ORAL_TABLET | Freq: Every day | ORAL | 0 refills | Status: DC

## 2019-11-05 MED ORDER — INSULIN REGULAR HUMAN 100 UNIT/ML IJ SOLN: INTRAMUSCULAR | 3 refills | Status: DC

## 2019-11-05 MED ORDER — TOUJEO SOLOSTAR 300 UNIT/ML ~~LOC~~ SOPN: 16.0000 [IU] | PEN_INJECTOR | Freq: Two times a day (BID) | SUBCUTANEOUS | 2 refills | Status: DC

## 2019-11-05 NOTE — Progress Notes (Signed)
Patient ID: Gabrielle Waller, female   DOB: [birth year], 53 y.o.   MRN: [id number]   Reason for Appointment : Follow up for Type 1 Diabetes  History of Present Illness          Diagnosis: Type 1 diabetes mellitus, date of diagnosis: 2001         Past history: Her blood sugar at diagnosis was 1056 and was started on insulin She is typically having difficulty controlling her diabetes because of cost of her medications and compliance She is also having relatively labile blood sugars Has difficulty understanding instructions for day-to-day management for diabetes and although she claims to be compliant with her insulin doses as prescribed she does not often check her blood sugars as directed and not clear how her diet affects her blood sugars   Recent history:   INSULIN regimen is described as: Toujeo 16 units twice daily; regular insulin 20 units at meals   A1c is 8.2 Usually over 8%  She is using a [employer] brand meter that cannot be downloaded Today's history was obtained with the help of an interpreter   Current problems and glucose  patterns:  Blood sugars are being checked mostly in the mornings and she tends to forget get readings after meals most of the time  Recently has only 2 readings after dinner and one of them is high but the other is not  FASTING blood sugars are mostly high with one reading of 64 however  She did not explain the variability in her blood sugars  However she does usually eat lunch at her work at [employer] and usually getting either a breaded fish sandwich or breaded chicken sandwiches  She thinks she is regular with taking her insulin before eating and is mostly eating 2 meals a day now  She had hypoglycemia last night with symptoms only and no glucose testing at that time; however she took 6 units of regular insulin at about 10 PM and blood sugar was 154 which she thought was too high  She has not done as much walking lately  Her weight is  about the same available Lab glucose was 187, previously at home was 175 fasting  Self-care:  Meals: 2-3 meals per day.  Breakfast none; lunch  11 am and dinner 5-6  p.m.    Glucose monitoring:  done  1-2 times a day         Glucometer: [employer] brand    Blood Glucose readings from review of monitor as below   Average is for 30 days   PRE-MEAL Fasting Lunch 6-7 pm Bedtime Overall  Glucose range: 64-189  132, 263 154, 305   Mean/median:     176   Previous  PRE-MEAL Fasting Lunch Dinner Bedtime Overall  Glucose range:  83-261   119  185, 260   Mean/median:      177   POST-MEAL PC Breakfast PC Lunch PC Dinner  Glucose range:   363  57, 90  Mean/median:        Dietician visit: Most recent:. ?     She has seen diabetes educator several times in the past      Wt Readings from Last 3 Encounters:  11/05/19 113 lb (51.3 kg)  08/03/19 114 lb 12.8 oz (52.1 kg)  05/01/19 114 lb 6.4 oz (51.9 kg)     Lab Results  Component Value Date   HGBA1C 8.2 (H) 11/02/2019   HGBA1C 8.1 (H) 07/30/2019   HGBA1C  8.1 (H) 04/28/2019   Lab Results  Component Value Date   MICROALBUR 0.9 07/30/2019   LDLCALC 120 (H) 01/26/2019   CREATININE 0.68 07/30/2019      Lab Results  Component Value Date   MICRALBCREAT 1.5 07/30/2019   MICRALBCREAT 2.5 07/23/2018     Allergies as of 11/05/2019      Reactions   Penicillins Itching      Medication List       Accurate as of November 05, 2019  9:33 AM. If you have any questions, ask your nurse or doctor.        acetaminophen 325 MG tablet Commonly known as: TYLENOL Take 650 mg by mouth every 6 (six) hours as needed.   aspirin 325 MG EC tablet Take 325 mg by mouth daily.   atorvastatin 80 MG tablet Commonly known as: LIPITOR Take 1 tablet (80 mg total) by mouth daily.   FreeStyle Libre 14 Day Sensor Misc 1 Units by Does not apply route every 14 (fourteen) days.   glucose blood test strip Commonly known as: ONE TOUCH ULTRA TEST Use to  check blood sugar 4 times daily   insulin regular 100 units/mL injection Commonly known as: NovoLIN R ReliOn Inject 15-20 units under the skin three times daily before meals.   loratadine 10 MG tablet Commonly known as: CLARITIN Take 1 tablet (10 mg total) by mouth daily.   Toujeo SoloStar 300 UNIT/ML Sopn Generic drug: Insulin Glargine (1 Unit Dial) Inject 16 Units into the skin 2 (two) times daily.   Vitamin D (Ergocalciferol) 1.25 MG (50000 UT) Caps capsule Commonly known as: DRISDOL Take 50,000 Units by mouth every 7 (seven) days.       Allergies:  Allergies  Allergen Reactions  . Penicillins Itching    Past Medical History:  Diagnosis Date  . Diabetes mellitus without complication (HCC)   . Kidney stones     Past Surgical History:  Procedure Laterality Date  . APPENDECTOMY      Family History  Problem Relation Age of Onset  . Diabetes Mother     Social History:  reports that she has never smoked. She has never used smokeless tobacco. She reports that she does not drink alcohol or use drugs.    Review of Systems:   She has a prior history of hyperthyroidism treated with Tapazole in 2001.   TSH level usually is below normal without increased T4 or T3.   Lab Results  Component Value Date   FREET4 0.81 11/02/2019   FREET4 0.70 01/26/2019   FREET4 0.69 11/09/2016   TSH 0.33 (L) 11/02/2019   TSH 0.33 (L) 01/26/2019   TSH 0.16 (L) 10/22/2018     She has had significant hyperlipidemia at baseline including high triglycerides. she is taking Lipitor 80 mg Has had inconsistent control of LDL  Lab Results  Component Value Date   CHOL 192 01/26/2019   CHOL 155 07/23/2018   CHOL 135 11/26/2017   Lab Results  Component Value Date   HDL 48.10 01/26/2019   HDL 41.30 07/23/2018   HDL 42.90 11/26/2017   Lab Results  Component Value Date   LDLCALC 120 (H) 01/26/2019   LDLCALC 93 07/23/2018   LDLCALC 75 11/26/2017   Lab Results  Component Value  Date   TRIG 121.0 01/26/2019   TRIG 104.0 07/23/2018   TRIG 82.0 11/26/2017   Lab Results  Component Value Date   CHOLHDL 4 01/26/2019   CHOLHDL 4 07/23/2018   CHOLHDL  3 11/26/2017   Lab Results  Component Value Date   LDLDIRECT 105.0 11/02/2019   LDLDIRECT 82.0 04/28/2019   LDLDIRECT 81.0 11/09/2016     She has had her influenza vaccine at the drugstore   Physical Examination:  BP 140/80 (BP Location: Left Arm, Patient Position: Sitting, Cuff Size: Normal)   Pulse 92   Ht 4\' 10"  (1.473 m)   Wt 113 lb (51.3 kg)   SpO2 98%   BMI 23.62 kg/m         ASSESSMENT/PLAN:   Diabetes type 1:  See history of present illness for detailed discussion of  current management, blood sugar patterns and problems identified  A1c is 8.2, previously 8.1  Since she is using a generic meter only able to get limited amount of data for review She is also not checking enough readings after meals as discussed above Not clear why she has variable readings in the mornings and these are mostly high However since she has readings as low as 64 unlikely that we can increase her basal insulin Also postprandial readings are highly variable based on her diet She does not adjust her mealtime dose based on what she is eating  Recommendations:  She does need to check with his insurance if she has coverage for any brand-name meter  Previously had inadequate coverage for freestyle libre  However she can start checking blood sugars more often after meals and not every morning  Discussed blood sugar targets fasting and after meals  To avoid hypoglycemia overnight she will need to avoid taking any regular insulin after dinner unless blood sugar is over 300  Discussed how to add another 4 units for any higher fat meals or more than 2 servings of carbohydrate  Regular walking for exercise  Improve diet: She will need to start eating fried food at [employer] and may have either salads, grilled  chicken or small hamburgers  Cut back on fatty meats as her cholesterol is going up  LIPIDS: LDL is higher than before despite her saying that her compliance with Lipitor is good, likely from inadequate compliance with diet especially with [facility] Discussed cutting back on saturated fats  History of hypothyroidism she still has mild decrease in TSH but this is stable and no increase in free T4 as before  Needs regular annual eye exams   Patient Instructions  No Regular insulin at 9 pm unless sugar > 300 or eating large snack     Reather LittlerAjay Ying Blankenhorn 11/05/2019, 9:33 AM     Counseling time on subjects discussed in assessment and plan sections is over 50% of today's 25 minute visit

## 2019-11-05 NOTE — Patient Instructions (Addendum)
No Regular insulin at 9 pm unless sugar > 300 or eating large snack  Call Insurance to see what brand of meter is covered

## 2019-12-07 DIAGNOSIS — Z01818 Encounter for other preprocedural examination: Secondary | ICD-10-CM | POA: Diagnosis not present

## 2019-12-07 DIAGNOSIS — H268 Other specified cataract: Secondary | ICD-10-CM | POA: Diagnosis not present

## 2019-12-31 DIAGNOSIS — H25811 Combined forms of age-related cataract, right eye: Secondary | ICD-10-CM | POA: Diagnosis not present

## 2019-12-31 DIAGNOSIS — H268 Other specified cataract: Secondary | ICD-10-CM | POA: Diagnosis not present

## 2019-12-31 DIAGNOSIS — H2511 Age-related nuclear cataract, right eye: Secondary | ICD-10-CM | POA: Diagnosis not present

## 2020-01-29 DIAGNOSIS — H2512 Age-related nuclear cataract, left eye: Secondary | ICD-10-CM | POA: Diagnosis not present

## 2020-01-29 DIAGNOSIS — H25812 Combined forms of age-related cataract, left eye: Secondary | ICD-10-CM | POA: Diagnosis not present

## 2020-02-01 ENCOUNTER — Other Ambulatory Visit (INDEPENDENT_AMBULATORY_CARE_PROVIDER_SITE_OTHER): Payer: BC Managed Care – PPO

## 2020-02-01 ENCOUNTER — Other Ambulatory Visit: Payer: Self-pay

## 2020-02-01 DIAGNOSIS — E1065 Type 1 diabetes mellitus with hyperglycemia: Secondary | ICD-10-CM | POA: Diagnosis not present

## 2020-02-01 LAB — COMPREHENSIVE METABOLIC PANEL
ALT: 19 U/L (ref 0–35)
AST: 18 U/L (ref 0–37)
Albumin: 4 g/dL (ref 3.5–5.2)
Alkaline Phosphatase: 89 U/L (ref 39–117)
BUN: 23 mg/dL (ref 6–23)
CO2: 27 mEq/L (ref 19–32)
Calcium: 9.2 mg/dL (ref 8.4–10.5)
Chloride: 105 mEq/L (ref 96–112)
Creatinine, Ser: 0.79 mg/dL (ref 0.40–1.20)
GFR: 75.96 mL/min (ref >60.00)
Glucose, Bld: 261 mg/dL — ABNORMAL HIGH (ref 70–99)
Potassium: 4.5 mEq/L (ref 3.5–5.1)
Sodium: 138 mEq/L (ref 135–145)
Total Bilirubin: 0.4 mg/dL (ref 0.2–1.2)
Total Protein: 6.7 g/dL (ref 6.0–8.3)

## 2020-02-01 LAB — LDL CHOLESTEROL, DIRECT: Direct LDL: 107 mg/dL

## 2020-02-01 LAB — HEMOGLOBIN A1C: Hgb A1c MFr Bld: 8.3 % — ABNORMAL HIGH (ref 4.6–6.5)

## 2020-02-04 ENCOUNTER — Encounter: Payer: Self-pay | Admitting: Endocrinology

## 2020-02-04 ENCOUNTER — Other Ambulatory Visit: Payer: Self-pay

## 2020-02-04 ENCOUNTER — Ambulatory Visit: Payer: BC Managed Care – PPO | Admitting: Endocrinology

## 2020-02-04 VITALS — BP 132/70 | HR 86 | Ht <= 58 in | Wt 114.2 lb

## 2020-02-04 DIAGNOSIS — E7800 Pure hypercholesterolemia, unspecified: Secondary | ICD-10-CM

## 2020-02-04 DIAGNOSIS — E1065 Type 1 diabetes mellitus with hyperglycemia: Secondary | ICD-10-CM | POA: Diagnosis not present

## 2020-02-04 MED ORDER — ONETOUCH DELICA LANCETS 30G MISC: 1.0000 | Freq: Three times a day (TID) | 2 refills | Status: AC

## 2020-02-04 MED ORDER — ROSUVASTATIN CALCIUM 40 MG PO TABS: 40.0000 mg | ORAL_TABLET | Freq: Every day | ORAL | 3 refills | Status: DC

## 2020-02-04 MED ORDER — GLUCOSE BLOOD VI STRP: ORAL_STRIP | 2 refills | Status: DC

## 2020-02-04 NOTE — Patient Instructions (Addendum)
Check blood sugars on waking up 3 days a week  Also check blood sugars daily about 2 hours after meals and do this after different meals by rotation  Recommended blood sugar levels on waking up are 90-130 and about 2 hours after meal is 130-160  Please bring your blood sugar monitor to each visit, thank you  Toujeo 16 on waking up and at dinner not 3x daily

## 2020-02-04 NOTE — Progress Notes (Signed)
Patient ID: Gabrielle Waller, female   DOB: 1966/02/04, 54 y.o.   MRN: [id number]   Reason for Appointment : Follow up for Type 1 Diabetes  History of Present Illness          Diagnosis: Type 1 diabetes mellitus, date of diagnosis: 2001         Past history: Her blood sugar at diagnosis was 1056 and was started on insulin She is typically having difficulty controlling her diabetes because of cost of her medications and compliance She is also having relatively labile blood sugars Has difficulty understanding instructions for day-to-day management for diabetes and although she claims to be compliant with her insulin doses as prescribed she does not often check her blood sugars as directed and not clear how her diet affects her blood sugars   Recent history:   INSULIN regimen is described as: Toujeo 16 units twice daily; regular insulin 20 units at meals   A1c is 8.3, Usually over 8%  She is using a [employer] brand meter that cannot be downloaded Today's history was obtained with the help of an interpreter   Current problems and glucose  patterns:  Blood sugars are being checked mostly in the mornings as before  She has been instructed to check readings after lunch and dinner especially in the evenings consistently to help adjust her mealtime dose  She thinks she is taking her mealtime dose consistently  FASTING blood sugars are mostly high with better readings the last 2 days only  She now says that sometimes she will take 3 doses of Tresiba including an extra 1 at lunch if her blood sugar is higher  Since last visit she is cutting back on fried food and taking her lunch from home  She did not explain the variability in her blood sugars   Hypoglycemia has been minimal lately except once late at night possibly from over estimating her mealtime dose combined with increased activity  She was told by her insurance that the One Touch meter is covered, currently still using  [employer] meter  Her weight is about the same    Self-care:  Meals: 2-3 meals per day.  Breakfast none; lunch  11 am and dinner 5-6  p.m.    Glucose monitoring:  done  1-2 times a day         Glucometer: [employer] brand     Blood Glucose readings from review of monitor as below   Average is for 30 days   PRE-MEAL Fasting Lunch Dinner Bedtime Overall  Glucose range: 132-266   208   Mean/median:     186   POST-MEAL PC Breakfast PC Lunch PC Dinner  Glucose range:  101 60, 331  Mean/median:      Prior data:  PRE-MEAL Fasting Lunch 6-7 pm Bedtime Overall  Glucose range: 64-189  132, 263 154, 305   Mean/median:     176      Dietician visit: Most recent:. ?     She has seen diabetes educator several times in the past      Wt Readings from Last 3 Encounters:  02/04/20 114 lb 3.2 oz (51.8 kg)  11/05/19 113 lb (51.3 kg)  08/03/19 114 lb 12.8 oz (52.1 kg)     Lab Results  Component Value Date   HGBA1C 8.3 (H) 02/01/2020   HGBA1C 8.2 (H) 11/02/2019   HGBA1C 8.1 (H) 07/30/2019   Lab Results  Component Value Date   MICROALBUR 0.9 07/30/2019  LDLCALC 120 (H) 01/26/2019   CREATININE 0.79 02/01/2020      Lab Results  Component Value Date   MICRALBCREAT 1.5 07/30/2019   MICRALBCREAT 2.5 07/23/2018     Allergies as of 02/04/2020      Reactions   Penicillins Itching      Medication List       Accurate as of February 04, 2020 10:03 AM. If you have any questions, ask your nurse or doctor.        acetaminophen 325 MG tablet Commonly known as: TYLENOL Take 650 mg by mouth every 6 (six) hours as needed.   aspirin 325 MG EC tablet Take 325 mg by mouth daily.   atorvastatin 80 MG tablet Commonly known as: LIPITOR Take 1 tablet (80 mg total) by mouth daily.   glucose blood test strip Commonly known as: ONE TOUCH ULTRA TEST Use to check blood sugar 4 times daily   insulin regular 100 units/mL injection Commonly known as: NovoLIN R ReliOn Inject 15-20 units under the  skin three times daily before meals.   loratadine 10 MG tablet Commonly known as: CLARITIN Take 1 tablet (10 mg total) by mouth daily.   Toujeo SoloStar 300 UNIT/ML Solostar Pen Generic drug: insulin glargine (1 Unit Dial) Inject 16 Units into the skin 2 (two) times daily.   Vitamin D (Ergocalciferol) 1.25 MG (50000 UNIT) Caps capsule Commonly known as: DRISDOL Take 50,000 Units by mouth every 7 (seven) days.       Allergies:  Allergies  Allergen Reactions  . Penicillins Itching    Past Medical History:  Diagnosis Date  . Diabetes mellitus without complication (HCC)   . Kidney stones     Past Surgical History:  Procedure Laterality Date  . APPENDECTOMY      Family History  Problem Relation Age of Onset  . Diabetes Mother     Social History:  reports that she has never smoked. She has never used smokeless tobacco. She reports that she does not drink alcohol or use drugs.    Review of Systems:   She has a prior history of hyperthyroidism treated with Tapazole in 2001.   TSH level usually is below normal without increased T4 or T3.   Lab Results  Component Value Date   FREET4 0.81 11/02/2019   FREET4 0.70 01/26/2019   FREET4 0.69 11/09/2016   TSH 0.33 (L) 11/02/2019   TSH 0.33 (L) 01/26/2019   TSH 0.16 (L) 10/22/2018    She has had significant hyperlipidemia at baseline including high triglycerides. she is taking Lipitor 80 mg Has had inconsistent control of LDL Now LDL appears to be higher than before and above 100 consistently She does not think she has missed any Lipitor doses  Lab Results  Component Value Date   CHOL 192 01/26/2019   CHOL 155 07/23/2018   CHOL 135 11/26/2017   Lab Results  Component Value Date   HDL 48.10 01/26/2019   HDL 41.30 07/23/2018   HDL 42.90 11/26/2017   Lab Results  Component Value Date   LDLCALC 120 (H) 01/26/2019   LDLCALC 93 07/23/2018   LDLCALC 75 11/26/2017   Lab Results  Component Value Date   TRIG  121.0 01/26/2019   TRIG 104.0 07/23/2018   TRIG 82.0 11/26/2017   Lab Results  Component Value Date   CHOLHDL 4 01/26/2019   CHOLHDL 4 07/23/2018   CHOLHDL 3 11/26/2017   Lab Results  Component Value Date   LDLDIRECT 107.0 02/01/2020  LDLDIRECT 105.0 11/02/2019   LDLDIRECT 82.0 04/28/2019       Physical Examination:  BP 132/70 (BP Location: Left Arm, Patient Position: Sitting, Cuff Size: Normal)   Pulse 86   Ht 4\' 10"  (1.473 m)   Wt 114 lb 3.2 oz (51.8 kg)   SpO2 98%   BMI 23.87 kg/m         ASSESSMENT/PLAN:   Diabetes type 1:  See history of present illness for detailed discussion of  current management, blood sugar patterns and problems identified  A1c is about the same at 8.3  As before she is not checking enough readings to help adjust her insulin Although she thinks she is avoiding high-fat meals now her blood sugars can be as high as 331 nonfasting and 266 fasting Unclear why her morning sugars also fluctuate Appears that she is sometimes taking 3 times a day instead of 2 times a day making it difficult to know what her actual basal insulin requirement is Hypoglycemia has been minimal lately except once late at night possibly from over estimating her mealtime dose combined with increased activity   Recommendations:  She will switch to the One Touch variable meter  Again emphasized the need to check readings after meals consistently and given written instructions on timing of glucose monitoring and blood sugar targets  She will not take any extra doses of Tresiba and only an injection on waking up at night dinnertime  Again she will try to check on the coverage for the freestyle libre although she was told it was $200 per month  Encourage her to start walking regularly  Adjust mealtime dose up or down 2 to 4 units for variable amount of carbohydrate at meals  LIPIDS: LDL is consistently high and we will switch her to Crestor 40 mg instead of  Lipitor    There are no Patient Instructions on file for this visit.    [place] 02/04/2020, 10:03 AM

## 2020-03-08 ENCOUNTER — Other Ambulatory Visit: Payer: Self-pay

## 2020-03-08 MED ORDER — TOUJEO SOLOSTAR 300 UNIT/ML ~~LOC~~ SOPN: 16.0000 [IU] | PEN_INJECTOR | Freq: Two times a day (BID) | SUBCUTANEOUS | 2 refills | Status: DC

## 2020-03-15 ENCOUNTER — Other Ambulatory Visit: Payer: Self-pay | Admitting: Endocrinology

## 2020-03-15 DIAGNOSIS — Z1231 Encounter for screening mammogram for malignant neoplasm of breast: Secondary | ICD-10-CM

## 2020-03-15 IMAGING — MG DIGITAL SCREENING BILATERAL MAMMOGRAM WITH CAD
5 series · 5 of 5 positions shown · non-contrast
Comparison: Previous exam(s).

CLINICAL DATA: Screening.

EXAM:
DIGITAL SCREENING BILATERAL MAMMOGRAM WITH CAD

[L MLO]
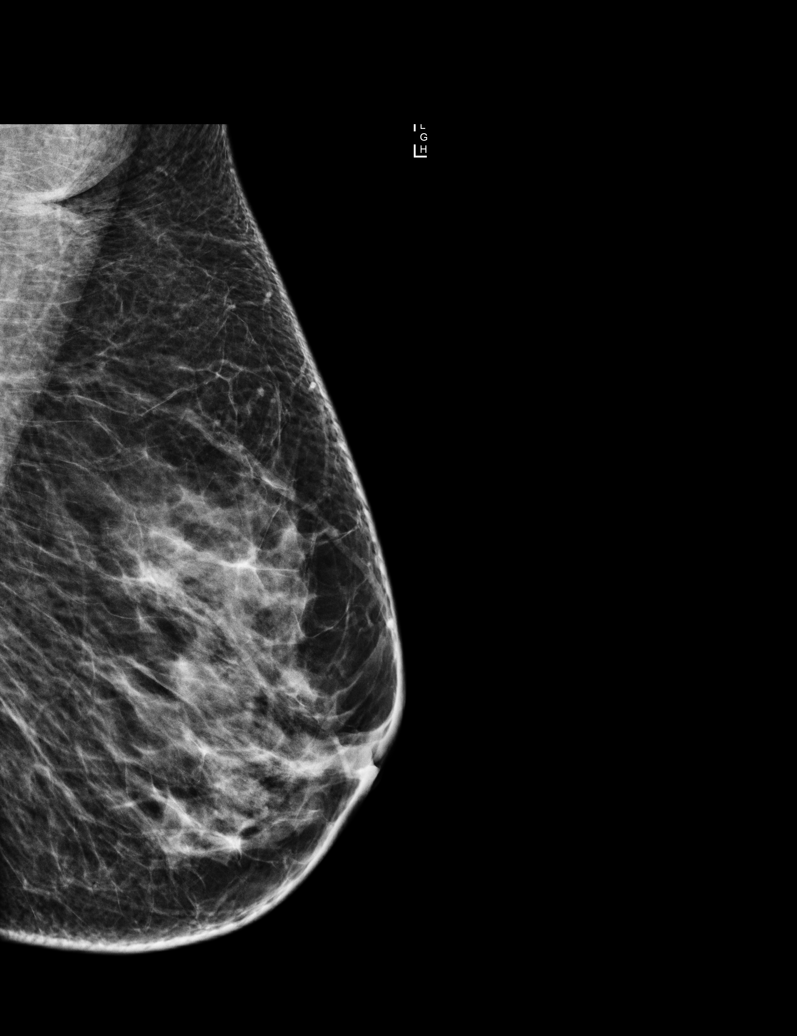

[R CC]
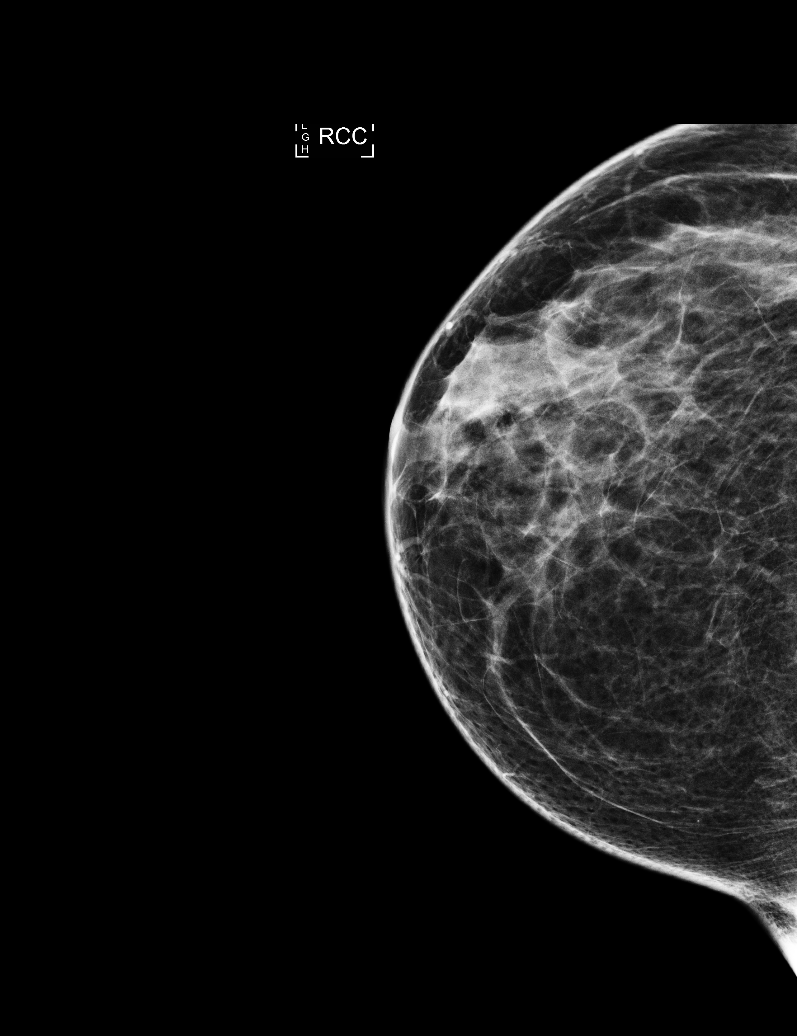

[R MLO (1 of 2)]
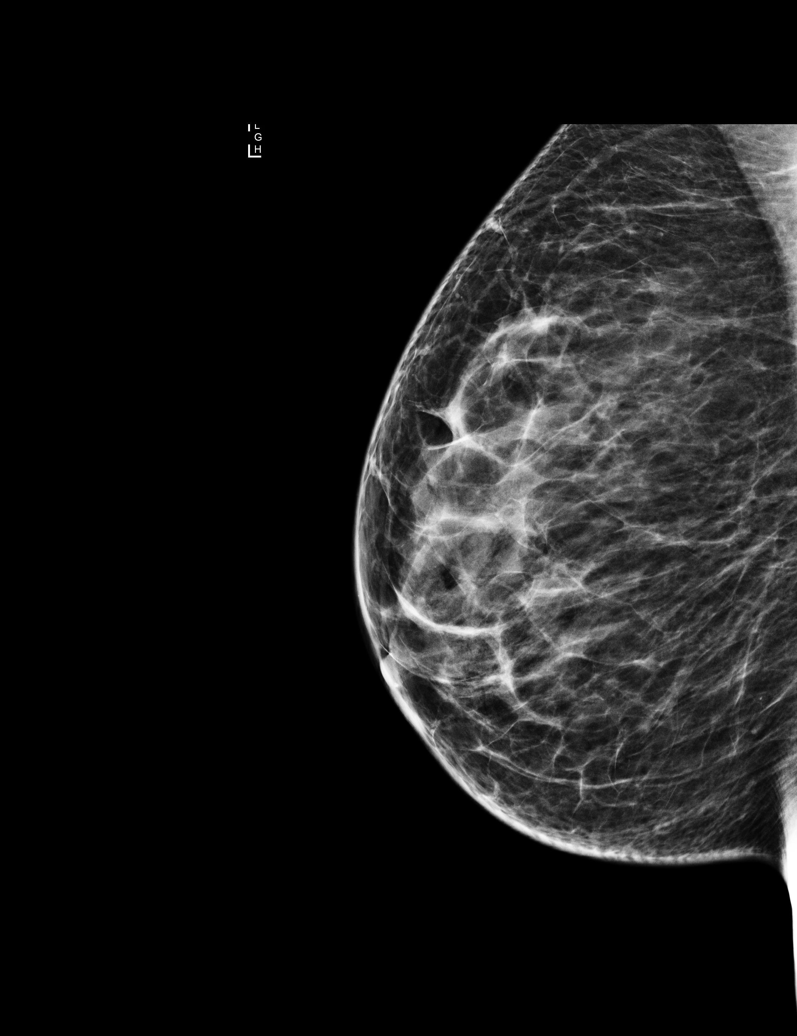

[R MLO (2 of 2)]
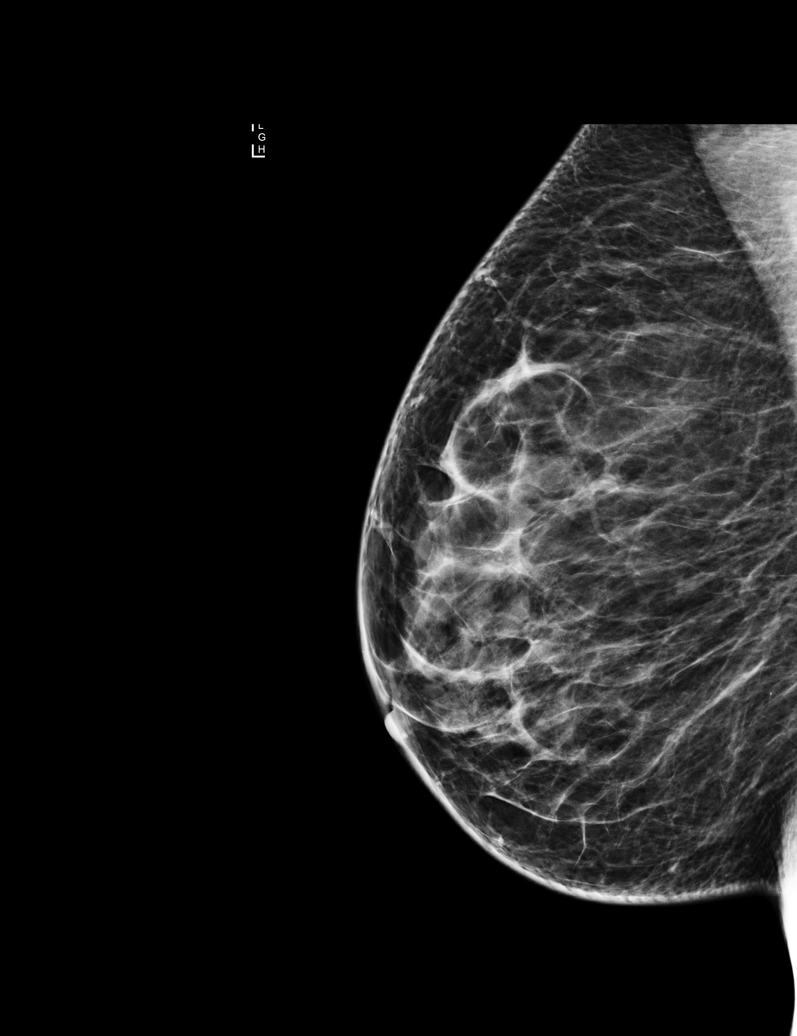

[L CC]
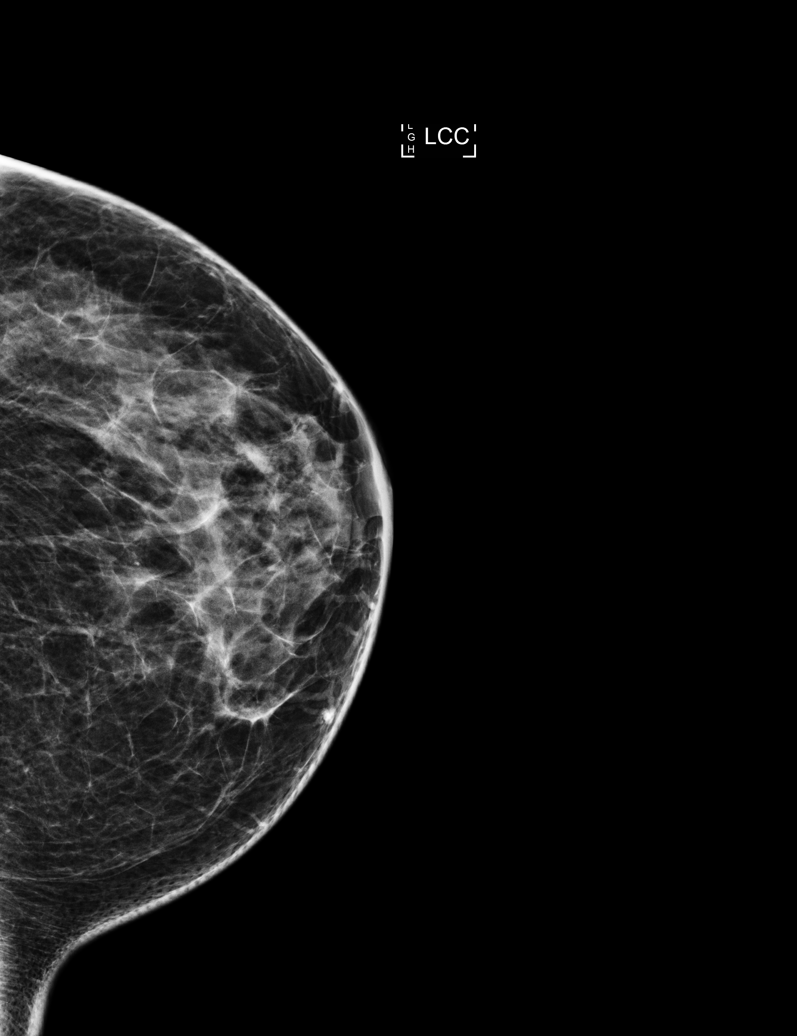

[5 of 5 positions shown; findings below may reference images not displayed]

ACR Breast Density Category c: The breast tissue is heterogeneously
dense, which may obscure small masses.
FINDINGS: There are no findings suspicious for malignancy. Images were
processed with CAD.
IMPRESSION: No mammographic evidence of malignancy. A result letter of this
screening mammogram will be mailed directly to the patient.

RECOMMENDATION:
Screening mammogram in one year. (Code:[id number])

BI-RADS CATEGORY  1: Negative.

## 2020-05-11 ENCOUNTER — Other Ambulatory Visit: Payer: BC Managed Care – PPO

## 2020-05-16 ENCOUNTER — Ambulatory Visit: Payer: BC Managed Care – PPO | Admitting: Endocrinology

## 2020-07-08 ENCOUNTER — Other Ambulatory Visit: Payer: Self-pay

## 2020-07-08 ENCOUNTER — Other Ambulatory Visit (INDEPENDENT_AMBULATORY_CARE_PROVIDER_SITE_OTHER): Payer: BC Managed Care – PPO

## 2020-07-08 DIAGNOSIS — E7800 Pure hypercholesterolemia, unspecified: Secondary | ICD-10-CM | POA: Diagnosis not present

## 2020-07-08 DIAGNOSIS — E1065 Type 1 diabetes mellitus with hyperglycemia: Secondary | ICD-10-CM

## 2020-07-08 LAB — GLUCOSE, RANDOM: Glucose, Bld: 247 mg/dL — ABNORMAL HIGH (ref 70–99)

## 2020-07-08 LAB — LDL CHOLESTEROL, DIRECT: Direct LDL: 105 mg/dL

## 2020-07-08 LAB — HEMOGLOBIN A1C: Hgb A1c MFr Bld: 10.4 % — ABNORMAL HIGH (ref 4.6–6.5)

## 2020-07-08 LAB — ALT: ALT: 20 U/L (ref 0–35)

## 2020-07-12 ENCOUNTER — Other Ambulatory Visit: Payer: Self-pay

## 2020-07-12 ENCOUNTER — Encounter: Payer: Self-pay | Admitting: Endocrinology

## 2020-07-12 ENCOUNTER — Ambulatory Visit: Payer: BC Managed Care – PPO | Admitting: Endocrinology

## 2020-07-12 VITALS — BP 134/70 | HR 84 | Ht <= 58 in | Wt 107.0 lb

## 2020-07-12 DIAGNOSIS — E1065 Type 1 diabetes mellitus with hyperglycemia: Secondary | ICD-10-CM

## 2020-07-12 DIAGNOSIS — E7800 Pure hypercholesterolemia, unspecified: Secondary | ICD-10-CM

## 2020-07-12 NOTE — Patient Instructions (Addendum)
Toujeo 20 units in morning and 16 at dinner  More sugars after lunch at 2-3 pm  Check blood sugars on waking up 5-7  days a week  Also check blood sugars about 2 hours after meals and do this after different meals by rotation  Recommended blood sugar levels on waking up are 90-130 and about 2 hours after meal is 130-180  Please bring your blood sugar monitor to each visit, thank you

## 2020-07-12 NOTE — Progress Notes (Signed)
Patient ID: Gabrielle Waller, female   DOB: 1966-01-04, 54 y.o.   MRN: [id number]   Reason for Appointment : Follow up for Type 1 Diabetes  History of Present Illness          Diagnosis: Type 1 diabetes mellitus, date of diagnosis: 2001         Past history: Her blood sugar at diagnosis was 1056 and was started on insulin She is typically having difficulty controlling her diabetes because of cost of her medications and compliance She is also having relatively labile blood sugars Has difficulty understanding instructions for day-to-day management for diabetes and although she claims to be compliant with her insulin doses as prescribed she does not often check her blood sugars as directed and not clear how her diet affects her blood sugars   Recent history:   INSULIN regimen is described as: Toujeo 16 units twice daily; regular insulin 20 units at meals   A1c is 10.4, Usually over 8%  She is using a [employer] brand meter that cannot be downloaded   Current problems and glucose  patterns:  She says she could not figure out how to use the One Touch Verio monitor which was given on the last visit and is still using the previous meter  Blood sugars are being checked generally on waking up and about 3 hours after dinner usually  Although most of her fasting blood sugars are high she has a good reading this morning and says that occasionally may have a low sugars during the night, lowest recently 95 at 3 AM  Yesterday she had a relatively high fat lunch at [employer] and despite taking 25 units regular insulin at lunch her sugar was over 200 at dinnertime  However not clear if her sugars are high before dinner consistently  Blood sugars after dinner are quite variable  She is not usually adjusting her mealtime dose based on what she is eating  Still eating mostly 2 meals a day at about noon and 6 PM  Not doing much physical activity or exercise because of leg pains  She said that  her sugars were higher when she was visiting [place] probably because she was eating more carbohydrates and cookies even though her appetite was decreased   Self-care:  Meals: 2-3 meals per day.  Breakfast none; lunch  11.30 am and dinner 6  p.m.    Glucose monitoring:  done  1-2 times a day         Glucometer: [employer] brand     Blood Glucose readings from review of monitor as below   Average is for 30 days   PRE-MEAL Fasting Lunch Dinner Bedtime Overall  Glucose range:  124-298   227  74-393   Mean/median:     216   Previously  PRE-MEAL Fasting Lunch Dinner Bedtime Overall  Glucose range: 132-266   208   Mean/median:     186   POST-MEAL PC Breakfast PC Lunch PC Dinner  Glucose range:  101 60, 331  Mean/median:         Dietician visit: Most recent:. ?     She has seen diabetes educator several times in the past      Wt Readings from Last 3 Encounters:  07/12/20 107 lb (48.5 kg)  02/04/20 114 lb 3.2 oz (51.8 kg)  11/05/19 113 lb (51.3 kg)     Lab Results  Component Value Date   HGBA1C 10.4 (H) 07/08/2020   HGBA1C 8.3 (  H) 02/01/2020   HGBA1C 8.2 (H) 11/02/2019   Lab Results  Component Value Date   MICROALBUR 0.9 07/30/2019   LDLCALC 120 (H) 01/26/2019   CREATININE 0.79 02/01/2020      Lab Results  Component Value Date   MICRALBCREAT 1.5 07/30/2019   MICRALBCREAT 2.5 07/23/2018     Allergies as of 07/12/2020      Reactions   Penicillins Itching      Medication List       Accurate as of July 12, 2020 10:13 AM. If you have any questions, ask your nurse or doctor.        acetaminophen 325 MG tablet Commonly known as: TYLENOL Take 650 mg by mouth every 6 (six) hours as needed.   aspirin 325 MG EC tablet Take 325 mg by mouth daily.   glucose blood test strip Use Onetouch verio test strips as instructed to check blood sugar 3 times daily.   insulin regular 100 units/mL injection Commonly known as: NovoLIN R ReliOn Inject 15-20 units under the  skin three times daily before meals.   loratadine 10 MG tablet Commonly known as: CLARITIN Take 1 tablet (10 mg total) by mouth daily.   OneTouch Delica Lancets 86P Misc 1 each by Does not apply route in the morning, at noon, and at bedtime. Use onetouch delica lancets to check blood sugar 3 times daily.   OneTouch Verio Flex System w/Device Kit 1 each by Does not apply route in the morning, at noon, and at bedtime. Use Onetouch verio flex meter to check blood sugar 3 times daily.   rosuvastatin 40 MG tablet Commonly known as: Crestor Take 1 tablet (40 mg total) by mouth daily.   Toujeo SoloStar 300 UNIT/ML Solostar Pen Generic drug: insulin glargine (1 Unit Dial) Inject 16 Units into the skin 2 (two) times daily.   Vitamin D (Ergocalciferol) 1.25 MG (50000 UNIT) Caps capsule Commonly known as: DRISDOL Take 50,000 Units by mouth every 7 (seven) days.       Allergies:  Allergies  Allergen Reactions  . Penicillins Itching    Past Medical History:  Diagnosis Date  . Diabetes mellitus without complication ([place])   . Kidney stones     Past Surgical History:  Procedure Laterality Date  . APPENDECTOMY      Family History  Problem Relation Age of Onset  . Diabetes Mother     Social History:  reports that she has never smoked. She has never used smokeless tobacco. She reports that she does not drink alcohol and does not use drugs.    Review of Systems:   She has a prior history of hyperthyroidism treated with Tapazole in 2001.   TSH level usually is below normal without increased T4 or T3.   Lab Results  Component Value Date   FREET4 0.81 11/02/2019   FREET4 0.70 01/26/2019   FREET4 0.69 11/09/2016   TSH 0.33 (L) 11/02/2019   TSH 0.33 (L) 01/26/2019   TSH 0.16 (L) 10/22/2018    She has had significant hyperlipidemia at baseline including high triglycerides. she was taking Lipitor 80 mg Has had inconsistent control of LDL  Again LDL is just above 100  despite switching to Crestor 40 from Lipitor  Lab Results  Component Value Date   CHOL 192 01/26/2019   CHOL 155 07/23/2018   CHOL 135 11/26/2017   Lab Results  Component Value Date   HDL 48.10 01/26/2019   HDL 41.30 07/23/2018   HDL 42.90 11/26/2017  Lab Results  Component Value Date   LDLCALC 120 (H) 01/26/2019   [place] 93 07/23/2018   LDLCALC 75 11/26/2017   Lab Results  Component Value Date   TRIG 121.0 01/26/2019   TRIG 104.0 07/23/2018   TRIG 82.0 11/26/2017   Lab Results  Component Value Date   CHOLHDL 4 01/26/2019   CHOLHDL 4 07/23/2018   CHOLHDL 3 11/26/2017   Lab Results  Component Value Date   LDLDIRECT 105.0 07/08/2020   LDLDIRECT 107.0 02/01/2020   LDLDIRECT 105.0 11/02/2019    No history of numbness in the feet  She says she had cataract surgery and her last exam was done at [facility] but no records available   Physical Examination:  BP 134/70 (BP Location: Left Arm, Patient Position: Sitting, Cuff Size: Normal)   Pulse 84   Ht _0  (1.473 m)   Wt 107 lb (48.5 kg)   SpO2 98%   BMI 22.36 kg/m       Diabetic Foot Exam - Simple   Simple Foot Form Diabetic Foot exam was performed with the following findings: Yes   Visual Inspection No deformities, no ulcerations, no other skin breakdown bilaterally: Yes Sensation Testing Intact to touch and monofilament testing bilaterally: Yes Pulse Check Posterior Tibialis and Dorsalis pulse intact bilaterally: Yes Comments     ASSESSMENT/PLAN:   Diabetes type 1:  See history of present illness for detailed discussion of  current management, blood sugar patterns and problems identified  A1c is much higher at 10.4  Blood sugars are inconsistent when checked in the morning and at bedtime Also still using the [employer] meter and not clear if this is consistently accurate Although she appears to be needing more basal insulin with her fasting readings being usually high when she does have  a few good readings in the mornings and normal to lower readings during the night rarely  As before cannot afford the [employer] or other CGM apparently She has not missed her insulin doses reportedly   Recommendations:  She will switch to the One Touch Verio meter and she was shown how to use this today  More consistent blood sugar monitoring after her lunch or before dinner to help adjust her basal insulin  She will increase her Toujeo to 20 units in the morning  Consider switching to NovoLog or Humalog instead of regular insulin  Consider CGM again on the next visit although not clear if she will be able to understand how to use this  Go up 5 units on her mealtime insulin for any higher fat meals  Also increase regular insulin by 5 units before meals if blood sugar over 200  LIPIDS: LDL is about the same on Crestor compared to Lipitor  will get reports for eye exam    Patient Instructions  Toujeo 20 units in morning and 16 at dinner  More sugars after lunch at 2-3 pm  Check blood sugars on waking up 5-7  days a week  Also check blood sugars about 2 hours after meals and do this after different meals by rotation  Recommended blood sugar levels on waking up are 90-130 and about 2 hours after meal is 130-180  Please bring your blood sugar monitor to each visit, thank you       Elayne Snare 07/12/2020, 10:13 AM

## 2020-07-31 ENCOUNTER — Other Ambulatory Visit: Payer: Self-pay | Admitting: Endocrinology

## 2020-08-04 ENCOUNTER — Other Ambulatory Visit: Payer: Self-pay | Admitting: Endocrinology

## 2020-09-20 ENCOUNTER — Other Ambulatory Visit: Payer: Self-pay | Admitting: Endocrinology

## 2020-10-03 ENCOUNTER — Other Ambulatory Visit: Payer: Self-pay

## 2020-10-03 ENCOUNTER — Ambulatory Visit
Admission: EM | Admit: 2020-10-03 | Discharge: 2020-10-03 | Disposition: A | Discharge status: Home / Self Care | Payer: BC Managed Care – PPO | Attending: Family Medicine | Admitting: Family Medicine

## 2020-10-03 ENCOUNTER — Encounter: Payer: Self-pay | Admitting: Family Medicine

## 2020-10-03 DIAGNOSIS — R1319 Other dysphagia: Secondary | ICD-10-CM

## 2020-10-03 DIAGNOSIS — T17308A Unspecified foreign body in larynx causing other injury, initial encounter: Secondary | ICD-10-CM | POA: Diagnosis not present

## 2020-10-03 MED ORDER — FAMOTIDINE 20 MG PO TABS: 20.0000 mg | ORAL_TABLET | Freq: Two times a day (BID) | ORAL | 0 refills | Status: AC

## 2020-10-03 NOTE — Discharge Instructions (Addendum)
Call the gastroenterologist for further evaluation

## 2020-10-03 NOTE — ED Triage Notes (Signed)
Pt c/o SOB, dry cough and dry throat x 1 year-states sx worse when she became SOB/choking while eating yesterday-NAD-steady gait

## 2020-10-03 NOTE — ED Provider Notes (Signed)
[facility]    CSN: [id number] Arrival date & time: 10/03/20  6734      History   Chief Complaint Chief Complaint  Patient presents with  . Shortness of Breath    HPI Gabrielle Waller is a 54 y.o. female.   54 yo established [facility] patient with shortness of breath.  Pt c/o SOB, dry cough and dry throat x 1 year-states sx worse when she became SOB/choking while eating yesterday-NAD-steady gait.  Symptoms are worse lying down and she is aware of swelling in left neck.  No weight loss.     Past Medical History:  Diagnosis Date  . Diabetes mellitus without complication ([place])   . Kidney stones     Patient Active Problem List   Diagnosis Date Noted  . Allergic rhinitis 03/08/2016  . Health care maintenance 03/08/2016  . Elevated blood pressure 03/08/2016  . Subclinical hyperthyroidism 07/16/2013  . Mixed hyperlipidemia 07/14/2013  . Uncontrolled type 1 diabetes mellitus ([place]) 07/10/2013    Past Surgical History:  Procedure Laterality Date  . APPENDECTOMY      OB History   No obstetric history on file.      Home Medications    Prior to Admission medications   Medication Sig Start Date End Date Taking? Authorizing Provider  acetaminophen (TYLENOL) 325 MG tablet Take 650 mg by mouth every 6 (six) hours as needed.    [provider]  aspirin 325 MG EC tablet Take 325 mg by mouth daily.    [provider]  Blood Glucose Monitoring Suppl ([place]) w/Device KIT 1 each by Does not apply route in the morning, at noon, and at bedtime. Use Onetouch verio flex meter to check blood sugar 3 times daily.    [provider]  famotidine (PEPCID) 20 MG tablet Take 1 tablet (20 mg total) by mouth 2 (two) times daily. 10/03/20   Robyn Haber, MD  glucose blood test strip Use Onetouch verio test strips as instructed to check blood sugar 3 times daily. 02/04/20   Elayne Snare, MD  loratadine (CLARITIN) 10 MG tablet Take 1 tablet  (10 mg total) by mouth daily. Patient not taking: Reported on 07/12/2020 03/08/16   [place], FNP  NOVOLIN R RELION 100 UNIT/ML injection INJECT 15 TO 20 UNITS SUBCUTANEOUSLY THREE TIMES DAILY BEFORE MEAL(S) 09/21/20   Elayne Snare, MD  OneTouch Delica Lancets 19F MISC 1 each by Does not apply route in the morning, at noon, and at bedtime. Use onetouch delica lancets to check blood sugar 3 times daily. 02/04/20   Elayne Snare, MD  rosuvastatin (CRESTOR) 40 MG tablet Take 1 tablet (40 mg total) by mouth daily. 02/04/20   Elayne Snare, MD  TOUJEO SOLOSTAR 300 UNIT/ML Solostar Pen ADMINISTER 16 UNITS UNDER THE SKIN TWICE DAILY 08/01/20   Elayne Snare, MD  Vitamin D, Ergocalciferol, (DRISDOL) 50000 units CAPS capsule Take 50,000 Units by mouth every 7 (seven) days.    [provider]    Family History Family History  Problem Relation Age of Onset  . Diabetes Mother     Social History Social History   Tobacco Use  . Smoking status: Never Smoker  . Smokeless tobacco: Never Used  Vaping Use  . Vaping Use: Never used  Substance Use Topics  . Alcohol use: No  . Drug use: No     Allergies   Penicillins   Review of Systems Review of Systems  Constitutional: Negative.   Respiratory: Positive  for cough and choking.      Physical Exam Triage Vital Signs ED Triage Vitals  Enc Vitals Group     BP --      Pulse --      Resp --      Temp --      Temp src --      SpO2 --      Weight 10/03/20 1011 108 lb (49 kg)     Height 10/03/20 1011 5' (1.524 m)     Head Circumference --      Peak Flow --      Pain Score 10/03/20 1010 1     Pain Loc --      Pain Edu? --      Excl. in [place]? --    No data found.  Updated Vital Signs BP (!) 150/81 (BP Location: Left Arm)   Pulse 84   Temp 97.9 F (36.6 C) (Oral)   Resp 18   Ht 5' (1.524 m)   Wt 49 kg   SpO2 97%   BMI 21.09 kg/m    Physical Exam Vitals and nursing note reviewed.  Constitutional:      General: She is not in  acute distress.    Appearance: She is well-developed and normal weight. She is not ill-appearing.  HENT:     Head: Normocephalic.     Mouth/Throat:     Mouth: Mucous membranes are moist.     Pharynx: Oropharynx is clear.  Neck:     Comments: 2 cm swelling left neck adjacent to larynx Cardiovascular:     Rate and Rhythm: Normal rate.     Heart sounds: Normal heart sounds.  Pulmonary:     Effort: Pulmonary effort is normal.     Breath sounds: Normal breath sounds.  Musculoskeletal:        General: Normal range of motion.     Cervical back: Normal range of motion and neck supple.  Skin:    General: Skin is warm and dry.  Neurological:     General: No focal deficit present.     Mental Status: She is alert.  Psychiatric:        Mood and Affect: Mood normal.        Behavior: Behavior normal.      UC Treatments / Results  Labs (all labs ordered are listed, but only abnormal results are displayed) Labs Reviewed - No data to display  EKG   Radiology No results found.  Procedures Procedures (including critical care time)  Medications Ordered in UC Medications - No data to display  Initial Impression / Assessment and Plan / UC Course  I have reviewed the triage vital signs and the nursing notes.  Pertinent labs & imaging results that were available during my care of the patient were reviewed by me and considered in my medical decision making (see chart for details).    Final Clinical Impressions(s) / UC Diagnoses   Final diagnoses:  Esophageal dysphagia  Choking, initial encounter     Discharge Instructions     Call the gastroenterologist for further evaluation    ED Prescriptions    Medication Sig Dispense Auth. Provider   famotidine (PEPCID) 20 MG tablet Take 1 tablet (20 mg total) by mouth 2 (two) times daily. 30 tablet Robyn Haber, MD     I have reviewed the PDMP during this encounter.   Robyn Haber, MD 10/03/20 1032

## 2020-10-07 ENCOUNTER — Other Ambulatory Visit: Payer: Self-pay | Admitting: Endocrinology

## 2020-10-11 ENCOUNTER — Other Ambulatory Visit: Payer: Self-pay

## 2020-10-11 ENCOUNTER — Other Ambulatory Visit (INDEPENDENT_AMBULATORY_CARE_PROVIDER_SITE_OTHER): Payer: BC Managed Care – PPO

## 2020-10-11 DIAGNOSIS — E1065 Type 1 diabetes mellitus with hyperglycemia: Secondary | ICD-10-CM | POA: Diagnosis not present

## 2020-10-11 LAB — HEMOGLOBIN A1C: Hgb A1c MFr Bld: 9.5 % — ABNORMAL HIGH (ref 4.6–6.5)

## 2020-10-11 LAB — GLUCOSE, RANDOM: Glucose, Bld: 166 mg/dL — ABNORMAL HIGH (ref 70–99)

## 2020-10-18 ENCOUNTER — Other Ambulatory Visit: Payer: Self-pay

## 2020-10-18 ENCOUNTER — Ambulatory Visit (INDEPENDENT_AMBULATORY_CARE_PROVIDER_SITE_OTHER): Payer: BC Managed Care – PPO | Admitting: Endocrinology

## 2020-10-18 VITALS — BP 130/84 | HR 79 | Ht <= 58 in | Wt 107.4 lb

## 2020-10-18 DIAGNOSIS — E7800 Pure hypercholesterolemia, unspecified: Secondary | ICD-10-CM | POA: Diagnosis not present

## 2020-10-18 DIAGNOSIS — E1065 Type 1 diabetes mellitus with hyperglycemia: Secondary | ICD-10-CM | POA: Diagnosis not present

## 2020-10-18 MED ORDER — FREESTYLE LIBRE 2 READER DEVI: 1.0000 | Freq: Once | 0 refills | Status: AC

## 2020-10-18 MED ORDER — GLUCOSE BLOOD VI STRP
ORAL_STRIP | 2 refills | Status: DC
Start: 2020-10-18 — End: 2021-01-17

## 2020-10-18 MED ORDER — FREESTYLE LIBRE 2 SENSOR MISC: 2.0000 | 3 refills | Status: DC

## 2020-10-18 NOTE — Progress Notes (Signed)
Patient ID: Gabrielle Waller, female   DOB: May 15, 1966, 54 y.o.   MRN: [id number]   Reason for Appointment : Follow up for Type 1 Diabetes  History of Present Illness          Diagnosis: Type 1 diabetes mellitus, date of diagnosis: 2001         Past history: Her blood sugar at diagnosis was 1056 and was started on insulin She is typically having difficulty controlling her diabetes because of cost of her medications and compliance She is also having relatively labile blood sugars Has difficulty understanding instructions for day-to-day management for diabetes and although she claims to be compliant with her insulin doses as prescribed she does not often check her blood sugars as directed and not clear how her diet affects her blood sugars   Recent history:   INSULIN regimen is described as: Toujeo 24 units am 16 in pm daily; regular insulin 20 units at meals   A1c is 9.5 compared to 10.4, Usually over 8%  She is using a [employer] brand meter that cannot be downloaded   Current problems and glucose  patterns:  She still has not started using the One Touch meter that was shown to her on the last visit and not clear whether she had difficulty affording the test strips  Blood sugar monitoring is still very inadequate and mostly checking fasting only in the morning  She was told to take 20 units of TOUJEO twice a day which she appears to be somewhat confused about how she is taking it and not clear if she is taking this twice a day  She thinks she is taking 24 units in the morning and 16 in the evening  However her blood sugars have been as high as 396 in the morning indicating likely she is not taking the evening dose daily  She is also not taking her REGULAR insulin consistently before meals with readings as high as 442 when she does not take it before eating  Last night she took her regular insulin at least an hour after eating and blood sugar was down to 53 around 3 AM  today  Weight is about the same  No hypoglycemia recently  Not doing much physical activity or exercise because of leg pains    Self-care:  Meals: 2-3 meals per day.  Breakfast none; lunch  11.30 am and dinner 6  p.m.    Glucose monitoring:  done  1-2 times a day         Glucometer: [employer] brand     Blood Glucose readings from review of monitor as below   Average is for 30 days   PRE-MEAL Fasting  PC breakfast Dinner Bedtime Overall  Glucose range:  152-396  152, ?  271   160-442   Mean/median:      243   Previous readings:  PRE-MEAL Fasting Lunch Dinner Bedtime Overall  Glucose range:  124-298   227  74-393   Mean/median:     216       Dietician visit: Most recent:. ?     She has seen diabetes educator several times in the past      Wt Readings from Last 3 Encounters:  10/18/20 107 lb 6.4 oz (48.7 kg)  10/03/20 108 lb (49 kg)  07/12/20 107 lb (48.5 kg)     Lab Results  Component Value Date   HGBA1C 9.5 (H) 10/11/2020   HGBA1C 10.4 (H) 07/08/2020  HGBA1C 8.3 (H) 02/01/2020   Lab Results  Component Value Date   MICROALBUR 0.9 07/30/2019   LDLCALC 120 (H) 01/26/2019   CREATININE 0.79 02/01/2020      Lab Results  Component Value Date   MICRALBCREAT 1.5 07/30/2019   MICRALBCREAT 2.5 07/23/2018     Allergies as of 10/18/2020      Reactions   Penicillins Itching      Medication List       Accurate as of October 18, 2020 11:22 AM. If you have any questions, ask your nurse or doctor.        STOP taking these medications   OneTouch Verio Flex System w/Device Kit Stopped by: Elayne Snare, MD     TAKE these medications   acetaminophen 325 MG tablet Commonly known as: TYLENOL Take 650 mg by mouth every 6 (six) hours as needed.   aspirin 325 MG EC tablet Take 325 mg by mouth daily.   famotidine 20 MG tablet Commonly known as: PEPCID Take 1 tablet (20 mg total) by mouth 2 (two) times daily.   FreeStyle Libre 2 Reader Devi 1 Device by Does  not apply route once for 1 dose. Started by: Elayne Snare, MD   FreeStyle Libre 2 Sensor Misc 2 Devices by Does not apply route every 14 (fourteen) days. Started by: Elayne Snare, MD   glucose blood test strip Use Onetouch verio test strips as instructed to check blood sugar 3 times daily.   loratadine 10 MG tablet Commonly known as: CLARITIN Take 1 tablet (10 mg total) by mouth daily.   NovoLIN R ReliOn 100 units/mL injection Generic drug: insulin regular INJECT 15 TO 20 UNITS SUBCUTANEOUSLY THREE TIMES DAILY BEFORE MEAL(S)   OneTouch Delica Lancets 09Q Misc 1 each by Does not apply route in the morning, at noon, and at bedtime. Use onetouch delica lancets to check blood sugar 3 times daily.   rosuvastatin 40 MG tablet Commonly known as: Crestor Take 1 tablet (40 mg total) by mouth daily.   Toujeo SoloStar 300 UNIT/ML Solostar Pen Generic drug: insulin glargine (1 Unit Dial) ADMINISTER 16 UNITS UNDER THE SKIN TWICE DAILY   Vitamin D (Ergocalciferol) 1.25 MG (50000 UNIT) Caps capsule Commonly known as: DRISDOL Take 50,000 Units by mouth every 7 (seven) days.       Allergies:  Allergies  Allergen Reactions  . Penicillins Itching    Past Medical History:  Diagnosis Date  . Diabetes mellitus without complication ([place])   . Kidney stones     Past Surgical History:  Procedure Laterality Date  . APPENDECTOMY      Family History  Problem Relation Age of Onset  . Diabetes Mother     Social History:  reports that she has never smoked. She has never used smokeless tobacco. She reports that she does not drink alcohol and does not use drugs.    Review of Systems:   She has a prior history of hyperthyroidism treated with Tapazole in 2001.   TSH level usually is below normal without increased T4 or T3.   Lab Results  Component Value Date   FREET4 0.81 11/02/2019   FREET4 0.70 01/26/2019   FREET4 0.69 11/09/2016   TSH 0.33 (L) 11/02/2019   TSH 0.33 (L) 01/26/2019    TSH 0.16 (L) 10/22/2018    She has had significant hyperlipidemia at baseline including high triglycerides. she was taking Lipitor 80 mg Has had inconsistent control of LDL  Again LDL is just above 100  despite switching to Crestor 40 from Lipitor  Lab Results  Component Value Date   CHOL 192 01/26/2019   CHOL 155 07/23/2018   CHOL 135 11/26/2017   Lab Results  Component Value Date   HDL 48.10 01/26/2019   HDL 41.30 07/23/2018   HDL 42.90 11/26/2017   Lab Results  Component Value Date   LDLCALC 120 (H) 01/26/2019   LDLCALC 93 07/23/2018   LDLCALC 75 11/26/2017   Lab Results  Component Value Date   TRIG 121.0 01/26/2019   TRIG 104.0 07/23/2018   TRIG 82.0 11/26/2017   Lab Results  Component Value Date   CHOLHDL 4 01/26/2019   CHOLHDL 4 07/23/2018   CHOLHDL 3 11/26/2017   Lab Results  Component Value Date   LDLDIRECT 105.0 07/08/2020   LDLDIRECT 107.0 02/01/2020   LDLDIRECT 105.0 11/02/2019    No history of numbness in the feet  She says she had cataract surgery and her last exam was done at [facility] but no records available   Physical Examination:  BP 130/84   Pulse 79   Ht 4' 10" (1.473 m)   Wt 107 lb 6.4 oz (48.7 kg)   SpO2 99%   BMI 22.45 kg/m        ASSESSMENT/PLAN:   Diabetes type 1:  See history of present illness for detailed discussion of  current management, blood sugar patterns and problems identified  A1c is still relatively high at 9.5, previously was higher at 10.4  Blood sugars are difficult to assess because of inadequate monitoring and inability to download her meter She is also likely not consistent with her insulin and is giving variable responses about how much Toujeo she is taking Some of her high readings after meals are related to forgetting her mealtime dose She will also get hypoglycemia such as last night if she takes her regular insulin in the evening late after the meal   Recommendations:  She will  switch to the One Touch Verio meter and new test trips were prescribed  She will also look at the cost of the freestyle libre now  Prescription was sent and explained to her the benefits of using this  She was again given a specific insulin instructions for both a regular insulin and Toujeo with Spanish translation  She will take 22 units twice a day at [facility] and make sure she takes this consistently  To take regular insulin consistently 20-minute before eating  To adjust the dose of regular insulin up or down based on amounts of carbohydrate or eating any fried food  If late for her meal time insulin dose in the evening she can reduce her dose to 15 units  She will call back to let us know about the name of her eye doctor    Patient Instructions  Toujeo pen  22 units twice daily  Take r insulin 20 min before eac meal   If late for pm regular insulin shot take only 15 not 20 units   Pluma Toujeo 22 unidades dos veces al da  [employer] r 20 min antes de cada comida  Si es tarde para la inyeccin de [place] regular, tome solo 15, no 20 unidades       Elayne Snare 10/18/2020, 11:22 AM

## 2020-10-18 NOTE — Patient Instructions (Addendum)
Toujeo pen  22 units twice daily  Take r insulin 20 min before eac meal   If late for pm regular insulin shot take only 15 not 20 units   Pluma Toujeo 22 unidades dos veces al da  [employer] r 20 min antes de cada comida  Si es tarde para la inyeccin de [place] regular, tome solo 15, no 20 unidades

## 2020-10-19 DIAGNOSIS — K219 Gastro-esophageal reflux disease without esophagitis: Secondary | ICD-10-CM | POA: Diagnosis not present

## 2020-10-19 DIAGNOSIS — E1065 Type 1 diabetes mellitus with hyperglycemia: Secondary | ICD-10-CM | POA: Diagnosis not present

## 2020-10-19 DIAGNOSIS — R634 Abnormal weight loss: Secondary | ICD-10-CM | POA: Diagnosis not present

## 2020-10-19 DIAGNOSIS — R1310 Dysphagia, unspecified: Secondary | ICD-10-CM | POA: Diagnosis not present

## 2020-11-08 DIAGNOSIS — R1310 Dysphagia, unspecified: Secondary | ICD-10-CM | POA: Diagnosis not present

## 2020-11-08 DIAGNOSIS — K2950 Unspecified chronic gastritis without bleeding: Secondary | ICD-10-CM | POA: Diagnosis not present

## 2020-11-08 DIAGNOSIS — K2970 Gastritis, unspecified, without bleeding: Secondary | ICD-10-CM | POA: Diagnosis not present

## 2020-11-08 DIAGNOSIS — K222 Esophageal obstruction: Secondary | ICD-10-CM | POA: Diagnosis not present

## 2020-11-08 DIAGNOSIS — K449 Diaphragmatic hernia without obstruction or gangrene: Secondary | ICD-10-CM | POA: Diagnosis not present

## 2020-11-08 DIAGNOSIS — K3189 Other diseases of stomach and duodenum: Secondary | ICD-10-CM | POA: Diagnosis not present

## 2020-12-07 DIAGNOSIS — K219 Gastro-esophageal reflux disease without esophagitis: Secondary | ICD-10-CM | POA: Diagnosis not present

## 2020-12-07 DIAGNOSIS — R1310 Dysphagia, unspecified: Secondary | ICD-10-CM | POA: Diagnosis not present

## 2021-01-13 ENCOUNTER — Other Ambulatory Visit: Payer: Self-pay

## 2021-01-13 ENCOUNTER — Other Ambulatory Visit (INDEPENDENT_AMBULATORY_CARE_PROVIDER_SITE_OTHER): Payer: BC Managed Care – PPO

## 2021-01-13 DIAGNOSIS — E1065 Type 1 diabetes mellitus with hyperglycemia: Secondary | ICD-10-CM

## 2021-01-13 LAB — BASIC METABOLIC PANEL
BUN: 19 mg/dL (ref 6–23)
CO2: 29 mEq/L (ref 19–32)
Calcium: 9.3 mg/dL (ref 8.4–10.5)
Chloride: 106 mEq/L (ref 96–112)
Creatinine, Ser: 0.82 mg/dL (ref 0.40–1.20)
GFR: 81.03 mL/min (ref >60.00)
Glucose, Bld: 203 mg/dL — ABNORMAL HIGH (ref 70–99)
Potassium: 4.7 mEq/L (ref 3.5–5.1)
Sodium: 141 mEq/L (ref 135–145)

## 2021-01-13 LAB — MICROALBUMIN / CREATININE URINE RATIO
Creatinine,U: 79.1 mg/dL
Microalb Creat Ratio: 12.6 mg/g (ref 0.0–30.0)
Microalb, Ur: 9.9 mg/dL — ABNORMAL HIGH (ref 0.0–1.9)

## 2021-01-13 LAB — HEMOGLOBIN A1C: Hgb A1c MFr Bld: 9.1 % — ABNORMAL HIGH (ref 4.6–6.5)

## 2021-01-17 ENCOUNTER — Ambulatory Visit: Payer: BC Managed Care – PPO | Admitting: Endocrinology

## 2021-01-17 ENCOUNTER — Other Ambulatory Visit: Payer: Self-pay

## 2021-01-17 ENCOUNTER — Encounter: Payer: Self-pay | Admitting: Endocrinology

## 2021-01-17 VITALS — BP 130/78 | HR 77 | Resp 18 | Ht 60.0 in | Wt 106.8 lb

## 2021-01-17 DIAGNOSIS — E7800 Pure hypercholesterolemia, unspecified: Secondary | ICD-10-CM | POA: Diagnosis not present

## 2021-01-17 DIAGNOSIS — R7989 Other specified abnormal findings of blood chemistry: Secondary | ICD-10-CM

## 2021-01-17 DIAGNOSIS — E1065 Type 1 diabetes mellitus with hyperglycemia: Secondary | ICD-10-CM | POA: Diagnosis not present

## 2021-01-17 MED ORDER — GLUCOSE BLOOD VI STRP: ORAL_STRIP | 2 refills | Status: DC

## 2021-01-17 MED ORDER — NOVOLOG FLEXPEN 100 UNIT/ML ~~LOC~~ SOPN: PEN_INJECTOR | SUBCUTANEOUS | 1 refills | Status: DC

## 2021-01-17 NOTE — Progress Notes (Signed)
Patient ID: Gabrielle Waller, female   DOB: 02-10-1966, 55 y.o.   MRN: [id number]   Reason for Appointment : Follow up for Type 1 Diabetes  History of Present Illness          Diagnosis: Type 1 diabetes mellitus, date of diagnosis: 2001         Past history: Her blood sugar at diagnosis was 1056 and was started on insulin She is typically having difficulty controlling her diabetes because of cost of her medications and compliance She is also having relatively labile blood sugars Has difficulty understanding instructions for day-to-day management for diabetes and although she claims to be compliant with her insulin doses as prescribed she does not often check her blood sugars as directed and not clear how her diet affects her blood sugars   Recent history:   INSULIN regimen is described as: Toujeo 24 units am 16 in pm daily; regular insulin 20-25 units at meals   A1c is 9.1, loss 9.5 , Usually over 8%  She is still using a [employer] brand meter that cannot be downloaded   Current problems and glucose  patterns:  She again has not been using the One Touch meter that was given to her last year  Blood sugar monitoring is mostly done in the mornings and occasionally at bedtime  As before fasting blood sugars are highly variable and she cannot explain this  She is still not quite sure how much insulin she is taking but she thinks she is taking her Toujeo twice a day as prescribed  Previously has had issues with not remembering her regular insulin before meals  She is mostly taking 20 units of regular insulin and will take 25 units if eating larger meals like [place] fries  Recently has only 1 documented low blood sugar at bedtime but she thinks that occasionally she may feel low during the night  She does try to walk back and forth from work up to 2 miles Weight is about the same     Self-care:  Meals: 2-3 meals per day.  Breakfast none; lunch  11.30 am and dinner 6  p.m.     Glucose monitoring:  done  1-2 times a day         Glucometer: [employer] brand     Blood Glucose readings from review of monitor as below     PRE-MEAL Fasting Lunch Dinner Bedtime Overall  Glucose range:  110-389   73-217  52-287   Mean/median:     ?   POST-MEAL PC Breakfast PC Lunch PC Dinner  Glucose range:    107?  Mean/median:      Previously:  PRE-MEAL Fasting  PC breakfast Dinner Bedtime Overall  Glucose range:  152-396  152, ?  271   160-442   Mean/median:      243      Dietician visit: Most recent:. ?     She has seen diabetes educator several times in the past      Wt Readings from Last 3 Encounters:  01/17/21 106 lb 12.8 oz (48.4 kg)  10/18/20 107 lb 6.4 oz (48.7 kg)  10/03/20 108 lb (49 kg)     Lab Results  Component Value Date   HGBA1C 9.1 (H) 01/13/2021   HGBA1C 9.5 (H) 10/11/2020   HGBA1C 10.4 (H) 07/08/2020   Lab Results  Component Value Date   MICROALBUR 9.9 (H) 01/13/2021   LDLCALC 120 (H) 01/26/2019   CREATININE 0.82 01/13/2021  Lab Results  Component Value Date   MICRALBCREAT 12.6 01/13/2021   MICRALBCREAT 1.5 07/30/2019     Allergies as of 01/17/2021      Reactions   Penicillins Itching      Medication List       Accurate as of January 17, 2021 11:19 AM. If you have any questions, ask your nurse or doctor.        STOP taking these medications   NovoLIN R ReliOn 100 units/mL injection Generic drug: insulin regular Stopped by: Reather Littler, MD     TAKE these medications   acetaminophen 325 MG tablet Commonly known as: TYLENOL Take 650 mg by mouth every 6 (six) hours as needed.   aspirin 325 MG EC tablet Take 325 mg by mouth daily.   famotidine 20 MG tablet Commonly known as: PEPCID Take 1 tablet (20 mg total) by mouth 2 (two) times daily.   FreeStyle Libre 2 Sensor Misc 2 Devices by Does not apply route every 14 (fourteen) days.   glucose blood test strip Use Onetouch verio test strips as instructed to check blood  sugar 3 times daily.   loratadine 10 MG tablet Commonly known as: CLARITIN Take 1 tablet (10 mg total) by mouth daily.   NovoLOG FlexPen 100 UNIT/ML FlexPen Generic drug: insulin aspart 20-25 units before meals tid Started by: Reather Littler, MD   OneTouch Delica Lancets 30G Misc 1 each by Does not apply route in the morning, at noon, and at bedtime. Use onetouch delica lancets to check blood sugar 3 times daily.   rosuvastatin 40 MG tablet Commonly known as: Crestor Take 1 tablet (40 mg total) by mouth daily.   Toujeo SoloStar 300 UNIT/ML Solostar Pen Generic drug: insulin glargine (1 Unit Dial) ADMINISTER 16 UNITS UNDER THE SKIN TWICE DAILY   Vitamin D (Ergocalciferol) 1.25 MG (50000 UNIT) Caps capsule Commonly known as: DRISDOL Take 50,000 Units by mouth every 7 (seven) days.       Allergies:  Allergies  Allergen Reactions  . Penicillins Itching    Past Medical History:  Diagnosis Date  . Diabetes mellitus without complication ([facility])   . Kidney stones     Past Surgical History:  Procedure Laterality Date  . APPENDECTOMY      Family History  Problem Relation Age of Onset  . Diabetes Mother     Social History:  reports that she has never smoked. She has never used smokeless tobacco. She reports that she does not drink alcohol and does not use drugs.    Review of Systems:   She has a prior history of hyperthyroidism treated with Tapazole in 2001.   TSH level usually is slightly low without increased T4 or T3.   Lab Results  Component Value Date   FREET4 0.81 11/02/2019   FREET4 0.70 01/26/2019   FREET4 0.69 11/09/2016   TSH 0.33 (L) 11/02/2019   TSH 0.33 (L) 01/26/2019   TSH 0.16 (L) 10/22/2018    She has had significant hyperlipidemia at baseline including high triglycerides. she was taking Lipitor 80 mg Has had inconsistent control of LDL  Last LDL is just above 100 despite switching to Crestor 40 from Lipitor  Lab Results  Component Value Date    CHOL 192 01/26/2019   CHOL 155 07/23/2018   CHOL 135 11/26/2017   Lab Results  Component Value Date   HDL 48.10 01/26/2019   HDL 41.30 07/23/2018   HDL 42.90 11/26/2017   Lab Results  Component Value  Date   LDLCALC 120 (H) 01/26/2019   LDLCALC 93 07/23/2018   LDLCALC 75 11/26/2017   Lab Results  Component Value Date   TRIG 121.0 01/26/2019   TRIG 104.0 07/23/2018   TRIG 82.0 11/26/2017   Lab Results  Component Value Date   CHOLHDL 4 01/26/2019   CHOLHDL 4 07/23/2018   CHOLHDL 3 11/26/2017   Lab Results  Component Value Date   LDLDIRECT 105.0 07/08/2020   LDLDIRECT 107.0 02/01/2020   LDLDIRECT 105.0 11/02/2019    No history of numbness in the feet  She says she had cataract surgery and her last exam was done at [facility] but no records available   Physical Examination:  BP 130/78 (BP Location: Left Arm, Patient Position: Sitting, Cuff Size: Normal)   Pulse 77   Resp 18   Ht 5' (1.524 m)   Wt 106 lb 12.8 oz (48.4 kg)   SpO2 99%   BMI 20.86 kg/m        ASSESSMENT/PLAN:   Diabetes type 1:  See history of present illness for detailed discussion of  current management, blood sugar patterns and problems identified  A1c is still relatively high at 9.1  Blood sugars are labile Not clear why her blood sugars fluctuate overnight and she does not think she is missing her insulin doses in the morning basal Likely variable diet also Since she has low normal sugars and occasional hypoglycemia at bedtime may do better with NovoLog instead of regular insulin Not sure if she can afford brand-name test if likely cannot afford the freestyle libre or other CGM   Recommendations:  She will switch to the One Touch Verio meter  Switch to NovoLog instead of regular insulin  No change in basic insulin doses as yet   Urine microalbumin normal   There are no Patient Instructions on file for this visit.    Reather Littler 01/17/2021, 11:19 AM

## 2021-02-08 ENCOUNTER — Other Ambulatory Visit: Payer: Self-pay | Admitting: Endocrinology

## 2021-02-13 ENCOUNTER — Telehealth: Payer: Self-pay | Admitting: Endocrinology

## 2021-02-13 NOTE — Telephone Encounter (Signed)
Pt calling regarding a medication refill. States that when she went to the pharmacy that there were no refills. She is wondering if it can be sent back in.   rosuvastatin (CRESTOR) 40 MG tablet  [id number] - [place] ([place]), [place] - [address]

## 2021-02-15 ENCOUNTER — Other Ambulatory Visit: Payer: Self-pay | Admitting: *Deleted

## 2021-02-15 DIAGNOSIS — E1065 Type 1 diabetes mellitus with hyperglycemia: Secondary | ICD-10-CM

## 2021-02-15 DIAGNOSIS — E7800 Pure hypercholesterolemia, unspecified: Secondary | ICD-10-CM

## 2021-02-15 MED ORDER — ROSUVASTATIN CALCIUM 40 MG PO TABS: 40.0000 mg | ORAL_TABLET | Freq: Every day | ORAL | 3 refills | Status: DC

## 2021-02-15 NOTE — Telephone Encounter (Signed)
Sent Rx rosuvastatin to the Pharmacy

## 2021-03-07 DIAGNOSIS — H527 Unspecified disorder of refraction: Secondary | ICD-10-CM | POA: Diagnosis not present

## 2021-03-07 LAB — HM DIABETES EYE EXAM

## 2021-04-13 ENCOUNTER — Other Ambulatory Visit: Payer: Self-pay | Admitting: Endocrinology

## 2021-05-15 ENCOUNTER — Other Ambulatory Visit (INDEPENDENT_AMBULATORY_CARE_PROVIDER_SITE_OTHER): Payer: BC Managed Care – PPO

## 2021-05-15 ENCOUNTER — Other Ambulatory Visit: Payer: Self-pay

## 2021-05-15 DIAGNOSIS — R7989 Other specified abnormal findings of blood chemistry: Secondary | ICD-10-CM | POA: Diagnosis not present

## 2021-05-15 DIAGNOSIS — E7800 Pure hypercholesterolemia, unspecified: Secondary | ICD-10-CM

## 2021-05-15 DIAGNOSIS — E1065 Type 1 diabetes mellitus with hyperglycemia: Secondary | ICD-10-CM

## 2021-05-15 LAB — COMPREHENSIVE METABOLIC PANEL
ALT: 22 U/L (ref 0–35)
AST: 25 U/L (ref 0–37)
Albumin: 4.3 g/dL (ref 3.5–5.2)
Alkaline Phosphatase: 111 U/L (ref 39–117)
BUN: 19 mg/dL (ref 6–23)
CO2: 27 mEq/L (ref 19–32)
Calcium: 9.8 mg/dL (ref 8.4–10.5)
Chloride: 105 mEq/L (ref 96–112)
Creatinine, Ser: 0.74 mg/dL (ref 0.40–1.20)
GFR: 91.44 mL/min (ref >60.00)
Glucose, Bld: 222 mg/dL — ABNORMAL HIGH (ref 70–99)
Potassium: 4.3 mEq/L (ref 3.5–5.1)
Sodium: 141 mEq/L (ref 135–145)
Total Bilirubin: 0.4 mg/dL (ref 0.2–1.2)
Total Protein: 7.3 g/dL (ref 6.0–8.3)

## 2021-05-15 LAB — LDL CHOLESTEROL, DIRECT: Direct LDL: 93 mg/dL

## 2021-05-15 LAB — HEMOGLOBIN A1C: Hgb A1c MFr Bld: 9.5 % — ABNORMAL HIGH (ref 4.6–6.5)

## 2021-05-15 LAB — TSH: TSH: 0.28 u[IU]/mL — ABNORMAL LOW (ref 0.35–5.50)

## 2021-05-22 ENCOUNTER — Ambulatory Visit: Payer: BC Managed Care – PPO | Admitting: Endocrinology

## 2021-05-22 ENCOUNTER — Other Ambulatory Visit: Payer: Self-pay | Admitting: Endocrinology

## 2021-05-22 ENCOUNTER — Other Ambulatory Visit: Payer: Self-pay

## 2021-05-22 VITALS — BP 144/78 | HR 84 | Ht 59.0 in | Wt 110.0 lb

## 2021-05-22 DIAGNOSIS — M79671 Pain in right foot: Secondary | ICD-10-CM | POA: Diagnosis not present

## 2021-05-22 DIAGNOSIS — E1065 Type 1 diabetes mellitus with hyperglycemia: Secondary | ICD-10-CM

## 2021-05-22 DIAGNOSIS — E7800 Pure hypercholesterolemia, unspecified: Secondary | ICD-10-CM | POA: Diagnosis not present

## 2021-05-22 DIAGNOSIS — M79672 Pain in left foot: Secondary | ICD-10-CM | POA: Diagnosis not present

## 2021-05-22 MED ORDER — BAQSIMI ONE PACK 3 MG/DOSE NA POWD: NASAL | 1 refills | Status: AC

## 2021-05-22 MED ORDER — TELMISARTAN 20 MG PO TABS: 20.0000 mg | ORAL_TABLET | Freq: Every day | ORAL | 2 refills | Status: DC

## 2021-05-22 NOTE — Progress Notes (Signed)
Patient ID: Gabrielle Waller, female   DOB: [birth year], 55 y.o.   MRN: [id number]   Reason for Appointment : Follow up for Type 1 Diabetes  History of Present Illness          Diagnosis: Type 1 diabetes mellitus, date of diagnosis: 2001         Past history: Her blood sugar at diagnosis was 1056 and was started on insulin She is typically having difficulty controlling her diabetes because of cost of her medications and compliance She is also having relatively labile blood sugars Has difficulty understanding instructions for day-to-day management for diabetes and although she claims to be compliant with her insulin doses as prescribed she does not often check her blood sugars as directed and not clear how her diet affects her blood sugars   Recent history:   INSULIN regimen is described as: Toujeo 20 units am 20 in pm daily; regular insulin 20-25 units at meals   A1c is 9.5 , Usually over 8%    Current problems and glucose  patterns: She has been using the One Touch meter that was given to her last year Blood sugar monitoring is mostly done in the mornings and at bedtime As before fasting blood sugars are highly variable and she cannot explain this Blood sugars are labile at all times and mostly high in the evenings after supper However she did have a significant episode of hypoglycemia overnight when she took 30 units of regular insulin instead of 20-25 She still has mostly high readings in the evenings not clear if she is getting adequate coverage for her lunch meal is also likely not getting enough for her evening meal since blood sugars are well over 200 after dinner meal As before fasting readings are quite inconsistent also.  Thinks she has hypoglycemia while at work She did not change her Toujeo doses as recommended and still takes 20 units both morning and evening   Self-care:  Meals: 2-3 meals per day.  Breakfast none; lunch  11.30 am and dinner 6  p.m.    Glucose  monitoring:  done  1-2 times a day         Glucometer: One Touch   Blood Glucose readings from review of monitor as below     PRE-MEAL Fasting Lunch Dinner Bedtime Overall  Glucose range: 61-331   63-500 61-500  Mean/median: 225  233 189 235   Previously:  PRE-MEAL Fasting Lunch Dinner Bedtime Overall  Glucose range:  110-389   73-217  52-287   Mean/median:     ?   POST-MEAL PC Breakfast PC Lunch PC Dinner  Glucose range:    107?  Mean/median:         Dietician visit: Most recent:. ?     She has seen diabetes educator several times in the past      Wt Readings from Last 3 Encounters:  05/22/21 110 lb (49.9 kg)  01/17/21 106 lb 12.8 oz (48.4 kg)  10/18/20 107 lb 6.4 oz (48.7 kg)     Lab Results  Component Value Date   HGBA1C 9.5 (H) 05/15/2021   HGBA1C 9.1 (H) 01/13/2021   HGBA1C 9.5 (H) 10/11/2020   Lab Results  Component Value Date   MICROALBUR 9.9 (H) 01/13/2021   LDLCALC 120 (H) 01/26/2019   CREATININE 0.74 05/15/2021      Lab Results  Component Value Date   MICRALBCREAT 12.6 01/13/2021   MICRALBCREAT 1.5 07/30/2019     Allergies  as of 05/22/2021       Reactions   Penicillins Itching        Medication List        Accurate as of May 22, 2021  3:14 PM. If you have any questions, ask your nurse or doctor.          acetaminophen 325 MG tablet Commonly known as: TYLENOL Take 650 mg by mouth every 6 (six) hours as needed.   aspirin 325 MG EC tablet Take 325 mg by mouth daily.   famotidine 20 MG tablet Commonly known as: PEPCID Take 1 tablet (20 mg total) by mouth 2 (two) times daily.   FreeStyle Libre 2 Sensor Misc 2 Devices by Does not apply route every 14 (fourteen) days.   glucose blood test strip Use Onetouch verio test strips as instructed to check blood sugar 3 times daily.   loratadine 10 MG tablet Commonly known as: CLARITIN Take 1 tablet (10 mg total) by mouth daily.   NovoLOG FlexPen 100 UNIT/ML FlexPen Generic drug:  insulin aspart 20-25 units before meals tid   OneTouch Delica Lancets 30G Misc 1 each by Does not apply route in the morning, at noon, and at bedtime. Use onetouch delica lancets to check blood sugar 3 times daily.   rosuvastatin 40 MG tablet Commonly known as: Crestor Take 1 tablet (40 mg total) by mouth daily.   Toujeo SoloStar 300 UNIT/ML Solostar Pen Generic drug: insulin glargine (1 Unit Dial) ADMINISTER 16 UNITS UNDER THE SKIN TWICE DAILY   Vitamin D (Ergocalciferol) 1.25 MG (50000 UNIT) Caps capsule Commonly known as: DRISDOL Take 50,000 Units by mouth every 7 (seven) days.        Allergies:  Allergies  Allergen Reactions   Penicillins Itching    Past Medical History:  Diagnosis Date   Diabetes mellitus without complication (HCC)    Kidney stones     Past Surgical History:  Procedure Laterality Date   APPENDECTOMY      Family History  Problem Relation Age of Onset   Diabetes Mother     Social History:  reports that she has never smoked. She has never used smokeless tobacco. She reports that she does not drink alcohol and does not use drugs.    Review of Systems:   She has a prior history of hyperthyroidism treated with Tapazole in 2001.   TSH level usually is slightly low without increased T4 or T3.   Lab Results  Component Value Date   FREET4 0.81 11/02/2019   FREET4 0.70 01/26/2019   FREET4 0.69 11/09/2016   TSH 0.28 (L) 05/15/2021   TSH 0.33 (L) 11/02/2019   TSH 0.33 (L) 01/26/2019    She has had significant hyperlipidemia at baseline including high triglycerides. she was taking Lipitor 80 mg Has had inconsistent control of LDL  Last LDL is just below 100 after switching to Crestor 40 from Lipitor She thinks she is taking this regularly now  Lab Results  Component Value Date   CHOL 192 01/26/2019   CHOL 155 07/23/2018   CHOL 135 11/26/2017   Lab Results  Component Value Date   HDL 48.10 01/26/2019   HDL 41.30 07/23/2018   HDL  42.90 11/26/2017   Lab Results  Component Value Date   LDLCALC 120 (H) 01/26/2019   LDLCALC 93 07/23/2018   LDLCALC 75 11/26/2017   Lab Results  Component Value Date   TRIG 121.0 01/26/2019   TRIG 104.0 07/23/2018   TRIG 82.0 11/26/2017  Lab Results  Component Value Date   CHOLHDL 4 01/26/2019   CHOLHDL 4 07/23/2018   CHOLHDL 3 11/26/2017   Lab Results  Component Value Date   LDLDIRECT 93.0 05/15/2021   LDLDIRECT 105.0 07/08/2020   LDLDIRECT 107.0 02/01/2020    No history of numbness in the feet She has pain in her feet but mostly after walking and not at night She has used some kind of insoles without any relief  She says she had cataract surgery and her last exam was done at [facility]  Blood pressure appears to be high normal Now relatively high check twice in the office Has family history of hypertension  BP Readings from Last 3 Encounters:  05/22/21 (!) 144/78  01/17/21 130/78  10/18/20 130/84      Physical Examination:  BP (!) 144/78   Pulse 84   Ht 4\' 11"  (1.499 m)   Wt 110 lb (49.9 kg)   SpO2 99%   BMI 22.22 kg/m        ASSESSMENT/PLAN:   Diabetes type 1:  See history of present illness for detailed discussion of  current management, blood sugar patterns and problems identified  A1c is still relatively high at 9.5 compared to 9.1  Blood sugars are labile She still has mostly high readings in the evenings not clear if she is getting adequate coverage for her lunch meal is also likely not getting enough for her evening meal since blood sugars are well over 200 after dinner meal As before fasting readings are quite inconsistent also She did not change her Toujeo doses as recommended  HYPERTENSION: Likely she has mild hypertension especially with family history For renal benefits she will likely need an ARB drug because of her poorly controlled diabetes  Recommendations: She will need to check blood sugars more consistently after  her lunch meal also Continue using One Touch Verio meter She will not take more than 25 units of regular insulin Make sure she takes her regular insulin up to 30 minutes before breakfast or dinner Given reminders on how to take the Toujeo insulin and instead of doing 20 units twice a day she will take 24 units in the morning and 18 at night She will adjust her regular insulin at dinnertime and also at breakfast time based on portions and amounts of carbohydrate, may take more for eating more starchy foods like tortillas  Again although she is a good candidate for a CGM she is likely not be able to afford it  Will see if Baqsimi is covered by her insurance as she does not think her husband can give her glucagon if needed Discussed appropriate treatment of severe hypoglycemia  Foot pain: This is likely to be from local factors and not neuropathy and she will be seen by a podiatrist  Probable mild hypertension: She will start Micardis 20 mg daily as this is preferred on her insurance    There are no Patient Instructions on file for this visit.    05/22/2021, 3:14 PM

## 2021-05-22 NOTE — Patient Instructions (Addendum)
Toujeo 24 units in  am 18  units in pm   See foot Dr    Regular 25 before dinner if getting more starchy food

## 2021-05-25 ENCOUNTER — Ambulatory Visit: Payer: BC Managed Care – PPO | Admitting: Endocrinology

## 2021-06-05 ENCOUNTER — Ambulatory Visit: Payer: BC Managed Care – PPO | Admitting: Podiatry

## 2021-06-05 ENCOUNTER — Ambulatory Visit (INDEPENDENT_AMBULATORY_CARE_PROVIDER_SITE_OTHER): Payer: BC Managed Care – PPO

## 2021-06-05 ENCOUNTER — Encounter: Payer: Self-pay | Admitting: Podiatry

## 2021-06-05 ENCOUNTER — Other Ambulatory Visit: Payer: Self-pay

## 2021-06-05 DIAGNOSIS — M7742 Metatarsalgia, left foot: Secondary | ICD-10-CM

## 2021-06-05 DIAGNOSIS — M778 Other enthesopathies, not elsewhere classified: Secondary | ICD-10-CM

## 2021-06-05 DIAGNOSIS — Z7689 Persons encountering health services in other specified circumstances: Secondary | ICD-10-CM

## 2021-06-05 DIAGNOSIS — M7741 Metatarsalgia, right foot: Secondary | ICD-10-CM | POA: Diagnosis not present

## 2021-06-05 DIAGNOSIS — E1065 Type 1 diabetes mellitus with hyperglycemia: Secondary | ICD-10-CM | POA: Diagnosis not present

## 2021-06-05 DIAGNOSIS — M722 Plantar fascial fibromatosis: Secondary | ICD-10-CM

## 2021-06-05 MED ORDER — MELOXICAM 7.5 MG PO TABS: 7.5000 mg | ORAL_TABLET | Freq: Every day | ORAL | 0 refills | Status: AC | PRN

## 2021-06-05 NOTE — Patient Instructions (Signed)
For instructions on how to put on your Plantar Fascial Brace, please visit [email or web address]   Plantar Fasciitis (Heel Spur Syndrome) with Rehab The plantar fascia is a fibrous, ligament-like, soft-tissue structure that spans the bottom of the foot. Plantar fasciitis is a condition that causes pain in the foot due to inflammation of the tissue. SYMPTOMS   Pain and tenderness on the underneath side of the foot.  Pain that worsens with standing or walking. CAUSES  Plantar fasciitis is caused by irritation and injury to the plantar fascia on the underneath side of the foot. Common mechanisms of injury include:  Direct trauma to bottom of the foot.  Damage to a small nerve that runs under the foot where the main fascia attaches to the heel bone.  Stress placed on the plantar fascia due to bone spurs. RISK INCREASES WITH:   Activities that place stress on the plantar fascia (running, jumping, pivoting, or cutting).  Poor strength and flexibility.  Improperly fitted shoes.  Tight calf muscles.  Flat feet.  Failure to warm-up properly before activity.  Obesity. PREVENTION  Warm up and stretch properly before activity.  Allow for adequate recovery between workouts.  Maintain physical fitness:  Strength, flexibility, and endurance.  Cardiovascular fitness.  Maintain a health body weight.  Avoid stress on the plantar fascia.  Wear properly fitted shoes, including arch supports for individuals who have flat feet.  PROGNOSIS  If treated properly, then the symptoms of plantar fasciitis usually resolve without surgery. However, occasionally surgery is necessary.  RELATED COMPLICATIONS   Recurrent symptoms that may result in a chronic condition.  Problems of the lower back that are caused by compensating for the injury, such as limping.  Pain or weakness of the foot during push-off following surgery.  Chronic inflammation, scarring, and partial or complete  fascia tear, occurring more often from repeated injections.  TREATMENT  Treatment initially involves the use of ice and medication to help reduce pain and inflammation. The use of strengthening and stretching exercises may help reduce pain with activity, especially stretches of the Achilles tendon. These exercises may be performed at home or with a therapist. Your caregiver may recommend that you use heel cups of arch supports to help reduce stress on the plantar fascia. Occasionally, corticosteroid injections are given to reduce inflammation. If symptoms persist for greater than 6 months despite non-surgical (conservative), then surgery may be recommended.   MEDICATION   If pain medication is necessary, then nonsteroidal anti-inflammatory medications, such as aspirin and ibuprofen, or other minor pain relievers, such as acetaminophen, are often recommended.  Do not take pain medication within 7 days before surgery.  Prescription pain relievers may be given if deemed necessary by your caregiver. Use only as directed and only as much as you need.  Corticosteroid injections may be given by your caregiver. These injections should be reserved for the most serious cases, because they may only be given a certain number of times.  HEAT AND COLD  Cold treatment (icing) relieves pain and reduces inflammation. Cold treatment should be applied for 10 to 15 minutes every 2 to 3 hours for inflammation and pain and immediately after any activity that aggravates your symptoms. Use ice packs or massage the area with a piece of ice (ice massage).  Heat treatment may be used prior to performing the stretching and strengthening activities prescribed by your caregiver, physical therapist, or athletic trainer. Use a heat pack or soak the injury in warm water.  SEEK IMMEDIATE MEDICAL   CARE IF:  Treatment seems to offer no benefit, or the condition worsens.  Any medications produce adverse side effects.   EXERCISES- RANGE OF MOTION (ROM) AND STRETCHING EXERCISES - Plantar Fasciitis (Heel Spur Syndrome) These exercises may help you when beginning to rehabilitate your injury. Your symptoms may resolve with or without further involvement from your physician, physical therapist or athletic trainer. While completing these exercises, remember:   Restoring tissue flexibility helps normal motion to return to the joints. This allows healthier, less painful movement and activity.  An effective stretch should be held for at least 30 seconds.  A stretch should never be painful. You should only feel a gentle lengthening or release in the stretched tissue.  RANGE OF MOTION - Toe Extension, Flexion  Sit with your right / left leg crossed over your opposite knee.  Grasp your toes and gently pull them back toward the top of your foot. You should feel a stretch on the bottom of your toes and/or foot.  Hold this stretch for 10 seconds.  Now, gently pull your toes toward the bottom of your foot. You should feel a stretch on the top of your toes and or foot.  Hold this stretch for 10 seconds. Repeat  times. Complete this stretch 3 times per day.   RANGE OF MOTION - Ankle Dorsiflexion, Active Assisted  Remove shoes and sit on a chair that is preferably not on a carpeted surface.  Place right / left foot under knee. Extend your opposite leg for support.  Keeping your heel down, slide your right / left foot back toward the chair until you feel a stretch at your ankle or calf. If you do not feel a stretch, slide your bottom forward to the edge of the chair, while still keeping your heel down.  Hold this stretch for 10 seconds. Repeat 3 times. Complete this stretch 2 times per day.   STRETCH  Gastroc, Standing  Place hands on wall.  Extend right / left leg, keeping the front knee somewhat bent.  Slightly point your toes inward on your back foot.  Keeping your right / left heel on the floor and your  knee straight, shift your weight toward the wall, not allowing your back to arch.  You should feel a gentle stretch in the right / left calf. Hold this position for 10 seconds. Repeat 3 times. Complete this stretch 2 times per day.  STRETCH  Soleus, Standing  Place hands on wall.  Extend right / left leg, keeping the other knee somewhat bent.  Slightly point your toes inward on your back foot.  Keep your right / left heel on the floor, bend your back knee, and slightly shift your weight over the back leg so that you feel a gentle stretch deep in your back calf.  Hold this position for 10 seconds. Repeat 3 times. Complete this stretch 2 times per day.  STRETCH  Gastrocsoleus, Standing  Note: This exercise can place a lot of stress on your foot and ankle. Please complete this exercise only if specifically instructed by your caregiver.   Place the ball of your right / left foot on a step, keeping your other foot firmly on the same step.  Hold on to the wall or a rail for balance.  Slowly lift your other foot, allowing your body weight to press your heel down over the edge of the step.  You should feel a stretch in your right / left calf.  Hold this   position for 10 seconds.  Repeat this exercise with a slight bend in your right / left knee. Repeat 3 times. Complete this stretch 2 times per day.   STRENGTHENING EXERCISES - Plantar Fasciitis (Heel Spur Syndrome)  These exercises may help you when beginning to rehabilitate your injury. They may resolve your symptoms with or without further involvement from your physician, physical therapist or athletic trainer. While completing these exercises, remember:   Muscles can gain both the endurance and the strength needed for everyday activities through controlled exercises.  Complete these exercises as instructed by your physician, physical therapist or athletic trainer. Progress the resistance and repetitions only as guided.  STRENGTH -  Towel Curls  Sit in a chair positioned on a non-carpeted surface.  Place your foot on a towel, keeping your heel on the floor.  Pull the towel toward your heel by only curling your toes. Keep your heel on the floor. Repeat 3 times. Complete this exercise 2 times per day.  STRENGTH - Ankle Inversion  Secure one end of a rubber exercise band/tubing to a fixed object (table, pole). Loop the other end around your foot just before your toes.  Place your fists between your knees. This will focus your strengthening at your ankle.  Slowly, pull your big toe up and in, making sure the band/tubing is positioned to resist the entire motion.  Hold this position for 10 seconds.  Have your muscles resist the band/tubing as it slowly pulls your foot back to the starting position. Repeat 3 times. Complete this exercises 2 times per day.  Document Released: 10/22/2005 Document Revised: 01/14/2012 Document Reviewed: 02/03/2009 [employer] Patient Information 2014 [employer]. 

## 2021-06-06 NOTE — Progress Notes (Signed)
Subjective:   Patient ID: Gabrielle Waller, female   DOB: 55 y.o.   MRN: [id number]   HPI 55 year old female presents the office today for concerns of pain into the heel as well as in the ball of her foot.  She states that it hurts most in the days that she is working on the days when she is not working her symptoms are much improved.  Left side is worse than the right.  No recent injury or trauma.  No swelling.  This seems to be a chronic issue.  She is diabetic and her last blood sugar she reports was 225.  Unsure of her A1c.  She does describe some " sleepy" sensations to her foot.  She does not have a primary care physician.  She is also describing swelling in her hands.   Review of Systems  All other systems reviewed and are negative.  Past Medical History:  Diagnosis Date   Diabetes mellitus without complication (HCC)    Kidney stones     Past Surgical History:  Procedure Laterality Date   APPENDECTOMY       Current Outpatient Medications:    meloxicam (MOBIC) 7.5 MG tablet, Take 1 tablet (7.5 mg total) by mouth daily as needed for pain., Disp: 30 tablet, Rfl: 0   acetaminophen (TYLENOL) 325 MG tablet, Take 650 mg by mouth every 6 (six) hours as needed., Disp: , Rfl:    aspirin 325 MG EC tablet, Take 325 mg by mouth daily., Disp: , Rfl:    Continuous Blood Gluc Sensor (FREESTYLE LIBRE 2 SENSOR) MISC, 2 Devices by Does not apply route every 14 (fourteen) days., Disp: 2 each, Rfl: 3   famotidine (PEPCID) 20 MG tablet, Take 1 tablet (20 mg total) by mouth 2 (two) times daily., Disp: 30 tablet, Rfl: 0   Glucagon (BAQSIMI ONE PACK) 3 MG/DOSE POWD, Use in nostril for sever low sugar, Disp: 1 each, Rfl: 1   glucose blood test strip, Use Onetouch verio test strips as instructed to check blood sugar 3 times daily., Disp: 100 each, Rfl: 2   loratadine (CLARITIN) 10 MG tablet, Take 1 tablet (10 mg total) by mouth daily., Disp: 90 tablet, Rfl: 0   NOVOLOG FLEXPEN 100 UNIT/ML FlexPen, INJECT 20 TO  25 UNITS SUBCUTANEOUSLY THREE TIMES DAILY BEFORE MEAL(S), Disp: 15 mL, Rfl: 0   OneTouch Delica Lancets 30G MISC, 1 each by Does not apply route in the morning, at noon, and at bedtime. Use onetouch delica lancets to check blood sugar 3 times daily., Disp: 100 each, Rfl: 2   rosuvastatin (CRESTOR) 40 MG tablet, Take 1 tablet (40 mg total) by mouth daily., Disp: 90 tablet, Rfl: 3   telmisartan (MICARDIS) 20 MG tablet, Take 1 tablet (20 mg total) by mouth daily., Disp: 30 tablet, Rfl: 2   TOUJEO SOLOSTAR 300 UNIT/ML Solostar Pen, ADMINISTER 16 UNITS UNDER THE SKIN TWICE DAILY, Disp: 4.5 mL, Rfl: 3   Vitamin D, Ergocalciferol, (DRISDOL) 50000 units CAPS capsule, Take 50,000 Units by mouth every 7 (seven) days., Disp: , Rfl:   Allergies  Allergen Reactions   Penicillins Itching          Objective:  Physical Exam  General: AAO x3, NAD  Dermatological: Skin is warm, dry and supple bilateral.  There are no open sores, no preulcerative lesions, no rash or signs of infection present.  Vascular: Dorsalis Pedis artery and Posterior Tibial artery pedal pulses are 2/4 bilateral with immedate capillary fill time. There is  no pain with calf compression, swelling, warmth, erythema.   Neruologic: Sensation decreased with Semmes Weinstein monofilament  Musculoskeletal: There is tenderness palpation on the plantar aspect calcaneus at the insertion of the plantar fascia.  There is no pain to the foot but she does get discomfort of the ball of her foot.  There is no area of pinpoint tenderness.  No edema, erythema.  Muscular strength 5/5 in all groups tested bilateral.  Gait: Unassisted, Nonantalgic.       Assessment:   55 year old female with heel pain, plan fasciitis less metatarsalgia; uncontrolled type I diabetes with neuropathy     Plan:  -Treatment options discussed including all alternatives, risks, and complications -Etiology of symptoms were discussed -X-rays were obtained and reviewed  with the patient.  There is no evidence of acute fracture identified today there is no stress fracture. -We will treat her symptoms She is on her feet.  Discussed shoe modifications and better arch supports.  She was to proceed with custom inserts.  She was measured for custom orthotics today. -Plantar fascial brace dispensed x 2 -Prescribed mobic. Discussed side effects of the medication and directed to stop if any are to occur and call the office.  -Hold off on steroid injection today. -Discussed glucose control. -Referral to family medicine to establish care.  Vivi Barrack DPM

## 2021-06-07 ENCOUNTER — Other Ambulatory Visit: Payer: Self-pay | Admitting: Podiatry

## 2021-06-07 DIAGNOSIS — M722 Plantar fascial fibromatosis: Secondary | ICD-10-CM

## 2021-06-07 DIAGNOSIS — M7741 Metatarsalgia, right foot: Secondary | ICD-10-CM

## 2021-06-22 ENCOUNTER — Other Ambulatory Visit: Payer: Self-pay | Admitting: Endocrinology

## 2021-06-27 ENCOUNTER — Telehealth: Payer: Self-pay | Admitting: Endocrinology

## 2021-06-27 NOTE — Telephone Encounter (Signed)
error 

## 2021-06-29 ENCOUNTER — Other Ambulatory Visit (HOSPITAL_COMMUNITY): Payer: Self-pay

## 2021-08-02 ENCOUNTER — Other Ambulatory Visit: Payer: Self-pay | Admitting: Endocrinology

## 2021-08-03 ENCOUNTER — Other Ambulatory Visit: Payer: Self-pay | Admitting: Endocrinology

## 2021-08-14 ENCOUNTER — Other Ambulatory Visit: Payer: Self-pay

## 2021-08-14 ENCOUNTER — Other Ambulatory Visit (INDEPENDENT_AMBULATORY_CARE_PROVIDER_SITE_OTHER): Payer: BC Managed Care – PPO

## 2021-08-14 DIAGNOSIS — E1065 Type 1 diabetes mellitus with hyperglycemia: Secondary | ICD-10-CM

## 2021-08-14 LAB — BASIC METABOLIC PANEL
BUN: 21 mg/dL (ref 6–23)
CO2: 28 mEq/L (ref 19–32)
Calcium: 9.5 mg/dL (ref 8.4–10.5)
Chloride: 105 mEq/L (ref 96–112)
Creatinine, Ser: 0.82 mg/dL (ref 0.40–1.20)
GFR: 80.7 mL/min (ref >60.00)
Glucose, Bld: 256 mg/dL — ABNORMAL HIGH (ref 70–99)
Potassium: 4.5 mEq/L (ref 3.5–5.1)
Sodium: 140 mEq/L (ref 135–145)

## 2021-08-14 LAB — HEMOGLOBIN A1C: Hgb A1c MFr Bld: 9.6 % — ABNORMAL HIGH (ref 4.6–6.5)

## 2021-08-16 ENCOUNTER — Other Ambulatory Visit: Payer: Self-pay | Admitting: Endocrinology

## 2021-08-21 ENCOUNTER — Other Ambulatory Visit: Payer: Self-pay

## 2021-08-21 ENCOUNTER — Encounter: Payer: Self-pay | Admitting: Endocrinology

## 2021-08-21 ENCOUNTER — Ambulatory Visit: Payer: BC Managed Care – PPO | Admitting: Endocrinology

## 2021-08-21 VITALS — BP 110/70 | HR 84 | Ht <= 58 in | Wt 111.0 lb

## 2021-08-21 DIAGNOSIS — I10 Essential (primary) hypertension: Secondary | ICD-10-CM | POA: Diagnosis not present

## 2021-08-21 DIAGNOSIS — E1065 Type 1 diabetes mellitus with hyperglycemia: Secondary | ICD-10-CM | POA: Diagnosis not present

## 2021-08-21 DIAGNOSIS — E7800 Pure hypercholesterolemia, unspecified: Secondary | ICD-10-CM | POA: Diagnosis not present

## 2021-08-21 MED ORDER — "INSULIN SYRINGE 31G X 5/16"" 0.5 ML MISC": 3 refills | Status: AC

## 2021-08-21 MED ORDER — TELMISARTAN 20 MG PO TABS: 20.0000 mg | ORAL_TABLET | Freq: Every day | ORAL | 2 refills | Status: DC

## 2021-08-21 NOTE — Progress Notes (Signed)
Patient ID: Gabrielle Waller, female   DOB: [birth year], 55 y.o.   MRN: [id number]   Reason for Appointment : Follow up for Type 1 Diabetes  History of Present Illness          Diagnosis: Type 1 diabetes mellitus, date of diagnosis: 2001         Past history: Gabrielle Waller blood sugar at diagnosis was 1056 and was started on insulin Gabrielle Waller is typically having difficulty controlling Gabrielle Waller diabetes because of cost of Gabrielle Waller medications and compliance Gabrielle Waller is also having relatively labile blood sugars Has difficulty understanding instructions for day-to-day management for diabetes and although Gabrielle Waller claims to be compliant with Gabrielle Waller insulin doses as prescribed Gabrielle Waller does not often check Gabrielle Waller blood sugars as directed and not clear how Gabrielle Waller diet affects Gabrielle Waller blood sugars   Recent history:   INSULIN regimen is described as: Toujeo 24 units am 20 in pm daily; regular insulin mostly 20 units at meals   A1c is 9.6, Usually over 8%   Current problems and glucose  patterns: Gabrielle Waller has been taking Toujeo twice a day but appears to be adjusting at least Gabrielle Waller evening dose based on how much Gabrielle Waller is eating  With this Gabrielle Waller has quite variable fasting blood sugars including low sugars recently Blood sugar monitoring is at breakfast and after dinner Has not been able to pay for the CGM in the past Also Gabrielle Waller says Gabrielle Waller co-pay for NovoLog insulin is over $200 even though Fiasp was appearing to be on Gabrielle Waller formulary Gabrielle Waller thinks Gabrielle Waller takes regular insulin 30-minute before eating However he now says that Gabrielle Waller is eating 3 meals a day and did not understand the need to take regular insulin at breakfast time when Gabrielle Waller takes Gabrielle Waller Toujeo Usually for breakfast Gabrielle Waller will take 2 slices of bread and eggs Also occasionally may have low sugars around midday or late afternoon However blood sugars on an average are still high at breakfast and after supper This is despite Gabrielle Waller saying that Gabrielle Waller takes up to 30 units for larger meals in the evening   Self-care:   Meals: 2-3 meals per day.  Breakfast 7 am; lunch  11.30 am and dinner 7 p.m.    Glucose monitoring:  done  1-2 times a day         Glucometer: One Touch   Blood Glucose readings from review of monitor as below    Blood sugar patterns: Highly variable readings in the morning and mostly high readings after dinner, sporadic readings midmorning which are mostly high and relatively low readings midday or afternoon when checked  PRE-MEAL Fasting Lunch Dinner Bedtime Overall  Glucose range:     52-405  Mean/median: 175    203+/-103   POST-MEAL PC Breakfast PC Lunch PC Dinner  Glucose range:     Mean/median:   263   Previously:  PRE-MEAL Fasting Lunch Dinner Bedtime Overall  Glucose range: 61-331   63-500 61-500  Mean/median: 225  233 189 235   Previously:  PRE-MEAL Fasting Lunch Dinner Bedtime Overall  Glucose range:  110-389   73-217  52-287   Mean/median:     ?   POST-MEAL PC Breakfast PC Lunch PC Dinner  Glucose range:    107?  Mean/median:         Dietician visit: Most recent:. ?     Gabrielle Waller has seen diabetes educator several times in the past      Wt Readings from Last 3 Encounters:  08/21/21  111 lb (50.3 kg)  05/22/21 110 lb (49.9 kg)  01/17/21 106 lb 12.8 oz (48.4 kg)     Lab Results  Component Value Date   HGBA1C 9.6 (H) 08/14/2021   HGBA1C 9.5 (H) 05/15/2021   HGBA1C 9.1 (H) 01/13/2021   Lab Results  Component Value Date   MICROALBUR 9.9 (H) 01/13/2021   LDLCALC 120 (H) 01/26/2019   CREATININE 0.82 08/14/2021      Lab Results  Component Value Date   MICRALBCREAT 12.6 01/13/2021   MICRALBCREAT 1.5 07/30/2019     Allergies as of 08/21/2021       Reactions   Penicillins Itching        Medication List        Accurate as of August 21, 2021  4:50 PM. If you have any questions, ask your nurse or doctor.          acetaminophen 325 MG tablet Commonly known as: TYLENOL Take 650 mg by mouth every 6 (six) hours as needed.   aspirin 325 MG EC  tablet Take 325 mg by mouth daily.   Baqsimi One Pack 3 MG/DOSE Powd Generic drug: Glucagon Use in nostril for sever low sugar   famotidine 20 MG tablet Commonly known as: PEPCID Take 1 tablet (20 mg total) by mouth 2 (two) times daily.   Fiasp FlexTouch 100 UNIT/ML FlexTouch Pen Generic drug: insulin aspart INJECT 20 TO 25 UNITS SUBCUTANEOUSLY THREE TIMES DAILY BEFORE MEAL(S)   FreeStyle Libre 2 Sensor Misc 2 Devices by Does not apply route every 14 (fourteen) days.   INSULIN SYRINGE .5CC/31GX5/16" 31G X 5/16" 0.5 ML Misc Use to inject insulin Started by: Reather Littler, MD   loratadine 10 MG tablet Commonly known as: CLARITIN Take 1 tablet (10 mg total) by mouth daily.   meloxicam 7.5 MG tablet Commonly known as: Mobic Take 1 tablet (7.5 mg total) by mouth daily as needed for pain.   OneTouch Delica Lancets 30G Misc 1 each by Does not apply route in the morning, at noon, and at bedtime. Use onetouch delica lancets to check blood sugar 3 times daily.   OneTouch Verio test strip Generic drug: glucose blood USE 1 STRIP TO CHECK GLUCOSE THREE TIMES DAILY   rosuvastatin 40 MG tablet Commonly known as: Crestor Take 1 tablet (40 mg total) by mouth daily.   telmisartan 20 MG tablet Commonly known as: MICARDIS Take 1 tablet (20 mg total) by mouth daily.   Toujeo SoloStar 300 UNIT/ML Solostar Pen Generic drug: insulin glargine (1 Unit Dial) ADMINISTER 16 UNITS UNDER THE SKIN TWICE DAILY   Vitamin D (Ergocalciferol) 1.25 MG (50000 UNIT) Caps capsule Commonly known as: DRISDOL Take 50,000 Units by mouth every 7 (seven) days.        Allergies:  Allergies  Allergen Reactions   Penicillins Itching    Past Medical History:  Diagnosis Date   Diabetes mellitus without complication (HCC)    Kidney stones     Past Surgical History:  Procedure Laterality Date   APPENDECTOMY      Family History  Problem Relation Age of Onset   Diabetes Mother     Social History:   reports that Gabrielle Waller has never smoked. Gabrielle Waller has never used smokeless tobacco. Gabrielle Waller reports that Gabrielle Waller does not drink alcohol and does not use drugs.    Review of Systems:   Gabrielle Waller has a prior history of hyperthyroidism treated with Tapazole in 2001.   TSH level usually is slightly low without increased  T4 or T3.   Lab Results  Component Value Date   FREET4 0.81 11/02/2019   FREET4 0.70 01/26/2019   FREET4 0.69 11/09/2016   TSH 0.28 (L) 05/15/2021   TSH 0.33 (L) 11/02/2019   TSH 0.33 (L) 01/26/2019    Gabrielle Waller has had significant hyperlipidemia at baseline including high triglycerides. Gabrielle Waller was taking Lipitor 80 mg Has had inconsistent control of LDL  Last LDL is just below 100 after switching to Crestor 40 from Lipitor Gabrielle Waller thinks Gabrielle Waller is taking this regularly now  Lab Results  Component Value Date   CHOL 192 01/26/2019   CHOL 155 07/23/2018   CHOL 135 11/26/2017   Lab Results  Component Value Date   HDL 48.10 01/26/2019   HDL 41.30 07/23/2018   HDL 42.90 11/26/2017   Lab Results  Component Value Date   LDLCALC 120 (H) 01/26/2019   LDLCALC 93 07/23/2018   LDLCALC 75 11/26/2017   Lab Results  Component Value Date   TRIG 121.0 01/26/2019   TRIG 104.0 07/23/2018   TRIG 82.0 11/26/2017   Lab Results  Component Value Date   CHOLHDL 4 01/26/2019   CHOLHDL 4 07/23/2018   CHOLHDL 3 11/26/2017   Lab Results  Component Value Date   LDLDIRECT 93.0 05/15/2021   LDLDIRECT 105.0 07/08/2020   LDLDIRECT 107.0 02/01/2020    No history of numbness in the feet Gabrielle Waller has pain in Gabrielle Waller feet but mostly after walking and not at night Gabrielle Waller has used some kind of insoles without any relief  Gabrielle Waller says Gabrielle Waller had cataract surgery and Gabrielle Waller last exam was done at [facility]  HYPERTENSION: Blood pressure has been higher in 2022 and Gabrielle Waller is now on Micardis 20 mg daily, no lightheadedness with this  Has family history of hypertension  BP Readings from Last 3 Encounters:  08/21/21 110/70  05/22/21  (!) 144/78  01/17/21 130/78      Physical Examination:  BP 110/70   Pulse 84   Ht 4\' 9"  (1.448 m)   Wt 111 lb (50.3 kg)   SpO2 99%   BMI 24.02 kg/m        ASSESSMENT/PLAN:   Diabetes type 1:  See history of present illness for detailed discussion of  current management, blood sugar patterns and problems identified  A1c is still significantly high at 9.6  Gabrielle Waller is on basal bolus insulin with Toujeo twice a day and regular insulin at meals  Problems identified: Blood sugars are overall still high but not consistently .  Gabrielle Waller is not able to afford the CGM which should be otherwise helpful Also cannot afford brand-name mealtime insulin and is still on regular insulin which is not optimal  Currently also adjusting Gabrielle Waller Toujeo based on Gabrielle Waller meal size in the evening and not on fasting blood sugar patterns Gabrielle Waller also does not understand the need to cover Gabrielle Waller breakfast meal which is containing carbohydrate with regular insulin Likely needs less insulin to cover Gabrielle Waller lunch possibly from being more active during the day However will need more insulin to cover dinner  HYPERTENSION: Mild and well controlled with telmisartan  Recommendations: Gabrielle Waller will need to check blood sugars at various times by rotation and more after breakfast or lunch  Given detailed instructions on how to adjust Gabrielle Waller insulin doses as follows  Take 20 R before breakfast 15 at lunch 25 at dinner average  Go up/down 5 units on R for larger/smaller meals Only 15 Toujeo in pam, stay on 24 in  am Savings card for NovoLog: https://www.novocare.com/novolog/savings-card.html for novolog  To call if Gabrielle Waller has consistently high or low readings in the mornings  Probable mild hypertension: Gabrielle Waller will start Micardis 20 mg daily as this is preferred on Gabrielle Waller insurance  Total visit time including counseling = 30 minutes  Patient Instructions  Take 20 R before breakfast 15 at lunch 25 at dinner average  Go up/down 5 units on R for  larger/smaller meals  Only 15 Toujeo in pam, stay on 24 in am   https://www.novocare.com/novolog/savings-card.html for novolog    Reather Littler 08/21/2021, 4:50 PM

## 2021-08-21 NOTE — Patient Instructions (Addendum)
Take 20 R before breakfast 15 at lunch 25 at dinner average  Go up/down 5 units on R for larger/smaller meals  Only 15 Toujeo in pam, stay on 24 in am   https://www.novocare.com/novolog/savings-[email or web address] for novolog

## 2021-10-11 ENCOUNTER — Telehealth: Payer: Self-pay

## 2021-10-11 DIAGNOSIS — E1065 Type 1 diabetes mellitus with hyperglycemia: Secondary | ICD-10-CM

## 2021-10-11 MED ORDER — TOUJEO SOLOSTAR 300 UNIT/ML ~~LOC~~ SOPN: PEN_INJECTOR | SUBCUTANEOUS | 3 refills | Status: DC

## 2021-10-11 NOTE — Telephone Encounter (Signed)
Inbound fax rx refill for [employer]

## 2021-11-20 ENCOUNTER — Other Ambulatory Visit (INDEPENDENT_AMBULATORY_CARE_PROVIDER_SITE_OTHER): Payer: BC Managed Care – PPO

## 2021-11-20 ENCOUNTER — Other Ambulatory Visit: Payer: Self-pay

## 2021-11-20 DIAGNOSIS — E7800 Pure hypercholesterolemia, unspecified: Secondary | ICD-10-CM

## 2021-11-20 DIAGNOSIS — E1065 Type 1 diabetes mellitus with hyperglycemia: Secondary | ICD-10-CM | POA: Diagnosis not present

## 2021-11-20 LAB — COMPREHENSIVE METABOLIC PANEL
ALT: 16 U/L (ref 0–35)
AST: 16 U/L (ref 0–37)
Albumin: 4.2 g/dL (ref 3.5–5.2)
Alkaline Phosphatase: 104 U/L (ref 39–117)
BUN: 22 mg/dL (ref 6–23)
CO2: 28 mEq/L (ref 19–32)
Calcium: 9.4 mg/dL (ref 8.4–10.5)
Chloride: 106 mEq/L (ref 96–112)
Creatinine, Ser: 0.75 mg/dL (ref 0.40–1.20)
GFR: 89.65 mL/min (ref >60.00)
Glucose, Bld: 218 mg/dL — ABNORMAL HIGH (ref 70–99)
Potassium: 4.5 mEq/L (ref 3.5–5.1)
Sodium: 141 mEq/L (ref 135–145)
Total Bilirubin: 0.3 mg/dL (ref 0.2–1.2)
Total Protein: 6.9 g/dL (ref 6.0–8.3)

## 2021-11-20 LAB — LIPID PANEL
Cholesterol: 185 mg/dL (ref 0–200)
HDL: 52.2 mg/dL (ref >39.00)
LDL Cholesterol: 96 mg/dL (ref 0–99)
NonHDL: 133.23
Total CHOL/HDL Ratio: 4
Triglycerides: 185 mg/dL — ABNORMAL HIGH (ref 0.0–149.0)
VLDL: 37 mg/dL (ref 0.0–40.0)

## 2021-11-20 LAB — HEMOGLOBIN A1C: Hgb A1c MFr Bld: 9.9 % — ABNORMAL HIGH (ref 4.6–6.5)

## 2021-11-20 LAB — MICROALBUMIN / CREATININE URINE RATIO
Creatinine,U: 99 mg/dL
Microalb Creat Ratio: 7.6 mg/g (ref 0.0–30.0)
Microalb, Ur: 7.5 mg/dL — ABNORMAL HIGH (ref 0.0–1.9)

## 2021-11-27 ENCOUNTER — Ambulatory Visit: Payer: BC Managed Care – PPO | Admitting: Endocrinology

## 2021-11-27 ENCOUNTER — Encounter: Payer: Self-pay | Admitting: Endocrinology

## 2021-11-27 ENCOUNTER — Other Ambulatory Visit: Payer: Self-pay

## 2021-11-27 VITALS — BP 122/78 | HR 92 | Ht <= 58 in | Wt 113.0 lb

## 2021-11-27 DIAGNOSIS — E1065 Type 1 diabetes mellitus with hyperglycemia: Secondary | ICD-10-CM

## 2021-11-27 DIAGNOSIS — E7800 Pure hypercholesterolemia, unspecified: Secondary | ICD-10-CM

## 2021-11-27 DIAGNOSIS — I10 Essential (primary) hypertension: Secondary | ICD-10-CM

## 2021-11-27 MED ORDER — INSULIN REGULAR HUMAN 100 UNIT/ML IJ SOLN: 25.0000 [IU] | Freq: Three times a day (TID) | INTRAMUSCULAR | 2 refills | Status: DC

## 2021-11-27 MED ORDER — FREESTYLE LIBRE 3 SENSOR MISC: 1.0000 | 2 refills | Status: DC

## 2021-11-27 NOTE — Progress Notes (Signed)
Patient ID: Gabrielle Waller, female   DOB: April 19, 1966, 56 y.o.   MRN: [id number]   Reason for Appointment : Follow up for Type 1 Diabetes  History of Present Illness          Diagnosis: Type 1 diabetes mellitus, date of diagnosis: 2001         Past history: Her blood sugar at diagnosis was 1056 and was started on insulin She is typically having difficulty controlling her diabetes because of cost of her medications and compliance She is also having relatively labile blood sugars Has difficulty understanding instructions for day-to-day management for diabetes and although she claims to be compliant with her insulin doses as prescribed she does not often check her blood sugars as directed and not clear how her diet affects her blood sugars   Recent history:   INSULIN regimen is described as: Toujeo 24 units am 20 in pm daily; regular insulin mostly 25-30 units at meals  Today's visit was conducted with the help of the interpreter  A1c is 9.9, Usually over 8%   Current problems and glucose  patterns: She has been checking her blood sugars mostly before breakfast and dinnertime and some after dinner also  She is still taking Toujeo twice a day and seem to be following the instructions for the dose now Last time her evening dose was reduced to 15 to avoid low sugars at night and the morning dose continued unchanged  However blood sugars are quite variable when checked Her mealtimes also appear to be variable from time to time  She now thinks that she is taking her suppertime Novolin regular insulin only part of the time when she is at home otherwise not taking it when eating out  She is adjusting at least her evening dose based on how much she is eating by about 5 units but does not appear to be cutting back when eating smaller meals She is not able to pay for the brand name NovoLog or Fiasp Has not been able to pay for the CGM in the past but not clear if she has checked on this in the  last year She appears to be taking 5 units more insulin to cover her meals now compared to her last visit Recently not hypoglycemic except rarely has readings in 60s later at night She went to [place] the [place] and blood sugars are likely higher with not eating right   Self-care:  Meals: 2-3 meals per day.  Breakfast 9-10 am; lunch  12pm and dinner 6-7 p.m.    Glucose monitoring:  done  1-2 times a day         Glucometer: One Touch   Blood Glucose readings and averages from review of monitor as below    Blood sugar patterns:  Mostly high blood sugars at all times with a few good readings in the mornings over before dinner as well as low normal rarely after dinner  PRE-MEAL Fasting Lunch Dinner Bedtime Overall  Glucose range: 119-307 140-458  64-389 64-400  Mean/median:    225 265   POST-MEAL PC Breakfast PC Lunch PC Dinner  Glucose range:  157-490   Mean/median:  320    PREVIOUSLY:  PRE-MEAL Fasting Lunch Dinner Bedtime Overall  Glucose range:     52-405  Mean/median: 175    203+/-103   POST-MEAL PC Breakfast PC Lunch PC Dinner  Glucose range:     Mean/median:   263  Dietician visit: Most recent:. ?     She has seen diabetes educator several times in the past      Wt Readings from Last 3 Encounters:  11/27/21 113 lb (51.3 kg)  08/21/21 111 lb (50.3 kg)  05/22/21 110 lb (49.9 kg)     Lab Results  Component Value Date   HGBA1C 9.9 (H) 11/20/2021   HGBA1C 9.6 (H) 08/14/2021   HGBA1C 9.5 (H) 05/15/2021   Lab Results  Component Value Date   MICROALBUR 7.5 (H) 11/20/2021   LDLCALC 96 11/20/2021   CREATININE 0.75 11/20/2021      Lab Results  Component Value Date   MICRALBCREAT 7.6 11/20/2021   MICRALBCREAT 12.6 01/13/2021     Allergies as of 11/27/2021       Reactions   Penicillins Itching        Medication List        Accurate as of November 27, 2021  2:53 PM. If you have any questions, ask your nurse or doctor.           acetaminophen 325 MG tablet Commonly known as: TYLENOL Take 650 mg by mouth every 6 (six) hours as needed.   aspirin 325 MG EC tablet Take 325 mg by mouth daily.   Baqsimi One Pack 3 MG/DOSE Powd Generic drug: Glucagon Use in nostril for sever low sugar   famotidine 20 MG tablet Commonly known as: PEPCID Take 1 tablet (20 mg total) by mouth 2 (two) times daily.   Fiasp FlexTouch 100 UNIT/ML FlexTouch Pen Generic drug: insulin aspart INJECT 20 TO 25 UNITS SUBCUTANEOUSLY THREE TIMES DAILY BEFORE MEAL(S)   FreeStyle Libre 2 Sensor Misc 2 Devices by Does not apply route every 14 (fourteen) days.   insulin regular 100 units/mL injection Commonly known as: NOVOLIN R Inject 25 Units into the skin 3 (three) times daily before meals.   INSULIN SYRINGE .5CC/31GX5/16" 31G X 5/16" 0.5 ML Misc Use to inject insulin   loratadine 10 MG tablet Commonly known as: CLARITIN Take 1 tablet (10 mg total) by mouth daily.   meloxicam 7.5 MG tablet Commonly known as: Mobic Take 1 tablet (7.5 mg total) by mouth daily as needed for pain.   OneTouch Delica Lancets 99991111 Misc 1 each by Does not apply route in the morning, at noon, and at bedtime. Use onetouch delica lancets to check blood sugar 3 times daily.   OneTouch Verio test strip Generic drug: glucose blood USE 1 STRIP TO CHECK GLUCOSE THREE TIMES DAILY   rosuvastatin 40 MG tablet Commonly known as: Crestor Take 1 tablet (40 mg total) by mouth daily.   telmisartan 20 MG tablet Commonly known as: MICARDIS Take 1 tablet (20 mg total) by mouth daily.   Toujeo SoloStar 300 UNIT/ML Solostar Pen Generic drug: insulin glargine (1 Unit Dial) ADMINISTER 16 UNITS UNDER THE SKIN TWICE DAILY What changed: additional instructions   Vitamin D (Ergocalciferol) 1.25 MG (50000 UNIT) Caps capsule Commonly known as: DRISDOL Take 50,000 Units by mouth every 7 (seven) days.        Allergies:  Allergies  Allergen Reactions   Penicillins  Itching    Past Medical History:  Diagnosis Date   Diabetes mellitus without complication ([place])    Kidney stones     Past Surgical History:  Procedure Laterality Date   APPENDECTOMY      Family History  Problem Relation Age of Onset   Diabetes Mother     Social History:  reports that she  has never smoked. She has never used smokeless tobacco. She reports that she does not drink alcohol and does not use drugs.    Review of Systems:   She has a prior history of hyperthyroidism treated with Tapazole in 2001.   TSH level usually is slightly low without increased T4 or T3.   Lab Results  Component Value Date   FREET4 0.81 11/02/2019   FREET4 0.70 01/26/2019   FREET4 0.69 11/09/2016   TSH 0.28 (L) 05/15/2021   TSH 0.33 (L) 11/02/2019   TSH 0.33 (L) 01/26/2019    She has had significant hyperlipidemia at baseline including high triglycerides. she was taking Lipitor 80 mg Has had inconsistent control of LDL  Last LDL is below 100 after switching to Crestor 40 from Lipitor She thinks she is taking this regularly   Lab Results  Component Value Date   CHOL 185 11/20/2021   CHOL 192 01/26/2019   CHOL 155 07/23/2018   Lab Results  Component Value Date   HDL 52.20 11/20/2021   HDL 48.10 01/26/2019   HDL 41.30 07/23/2018   Lab Results  Component Value Date   LDLCALC 96 11/20/2021   LDLCALC 120 (H) 01/26/2019   LDLCALC 93 07/23/2018   Lab Results  Component Value Date   TRIG 185.0 (H) 11/20/2021   TRIG 121.0 01/26/2019   TRIG 104.0 07/23/2018   Lab Results  Component Value Date   CHOLHDL 4 11/20/2021   CHOLHDL 4 01/26/2019   CHOLHDL 4 07/23/2018   Lab Results  Component Value Date   LDLDIRECT 93.0 05/15/2021   LDLDIRECT 105.0 07/08/2020   LDLDIRECT 107.0 02/01/2020    No history of numbness in the feet  She says she had cataract surgery and her last exam was done at [facility]: Blood pressure had been higher in 2022 and she  is on Micardis 20 mg daily  Has family history of hypertension  BP Readings from Last 3 Encounters:  11/27/21 122/78  08/21/21 110/70  05/22/21 (!) 144/78      Physical Examination:  BP 122/78    Pulse 92    Ht 4\' 9"  (1.448 m)    Wt 113 lb (51.3 kg)    SpO2 99%    BMI 24.45 kg/m        ASSESSMENT/PLAN:   Diabetes type 1:  See history of present illness for detailed discussion of  current management, blood sugar patterns and problems identified  A1c is still significantly high at 9.9  She is on basal bolus insulin with Toujeo twice a day and regular insulin at meals  Problems identified: Blood sugars are overall still high but will periodically have a good reading indicating variable compliance and also inherent variability  She does have significantly high readings when she misses her Novolin R with the evening dose especially when eating more carbohydrate, this also tends to continue to cause hyperglycemia the next morning  However she still has some good readings in the morning and before dinner indicating overall well adjusted basal insulin dose Timing of her regular insulin may be not adequate 30-minute before eating also A1c nearly 10% and discussed what her average blood sugar is   Recommendations for diabetes: She will need to check blood sugars more consistently after meals and in the evening and do before and after eating . Continue same insulin regimen for now Discussed importance of taking her regular insulin consistently before each meal and needs to take it with her  when she is eating out  Previously although she probably did not have coverage for her CGM we will try to get this again with the freestyle libre 3 and showed her how this would be used, she does have a smart phone and likely can use this  HYPERTENSION: Mild and well controlled with telmisartan 20 mg daily Microalbumin also normal To continue olmesartan 20 mg daily  LIPIDS: Well-controlled and  she will continue simvastatin  Total visit time including counseling = 30 minutes  There are no Patient Instructions on file for this visit.    Elayne Snare 11/27/2021, 2:53 PM

## 2021-11-27 NOTE — Patient Instructions (Addendum)
Take Novolin R 30 min before each meal  Check blood sugars on waking up 5 days a week and before each meal   Also check blood sugars about 2 hours after meals and do this after different meals by rotation  Recommended blood sugar levels on waking up are 90-130 and about 2 hours after meal is 130-160  Please bring your blood sugar monitor to each visit, thank you

## 2021-11-30 ENCOUNTER — Other Ambulatory Visit: Payer: Self-pay | Admitting: Endocrinology

## 2021-12-19 DIAGNOSIS — Z3009 Encounter for other general counseling and advice on contraception: Secondary | ICD-10-CM | POA: Diagnosis not present

## 2021-12-19 DIAGNOSIS — Z30431 Encounter for routine checking of intrauterine contraceptive device: Secondary | ICD-10-CM | POA: Diagnosis not present

## 2021-12-19 DIAGNOSIS — Z113 Encounter for screening for infections with a predominantly sexual mode of transmission: Secondary | ICD-10-CM | POA: Diagnosis not present

## 2021-12-19 DIAGNOSIS — Z01419 Encounter for gynecological examination (general) (routine) without abnormal findings: Secondary | ICD-10-CM | POA: Diagnosis not present

## 2022-01-29 ENCOUNTER — Other Ambulatory Visit: Payer: Self-pay | Admitting: Endocrinology

## 2022-02-19 ENCOUNTER — Other Ambulatory Visit (INDEPENDENT_AMBULATORY_CARE_PROVIDER_SITE_OTHER): Payer: BC Managed Care – PPO

## 2022-02-19 DIAGNOSIS — E1065 Type 1 diabetes mellitus with hyperglycemia: Secondary | ICD-10-CM

## 2022-02-19 LAB — BASIC METABOLIC PANEL
BUN: 19 mg/dL (ref 6–23)
CO2: 29 mEq/L (ref 19–32)
Calcium: 9.5 mg/dL (ref 8.4–10.5)
Chloride: 101 mEq/L (ref 96–112)
Creatinine, Ser: 0.79 mg/dL (ref 0.40–1.20)
GFR: 84.09 mL/min (ref >60.00)
Glucose, Bld: 430 mg/dL — ABNORMAL HIGH (ref 70–99)
Potassium: 4.7 mEq/L (ref 3.5–5.1)
Sodium: 136 mEq/L (ref 135–145)

## 2022-02-19 LAB — HEMOGLOBIN A1C: Hgb A1c MFr Bld: 9 % — ABNORMAL HIGH (ref 4.6–6.5)

## 2022-02-26 ENCOUNTER — Ambulatory Visit: Payer: BC Managed Care – PPO | Admitting: Endocrinology

## 2022-02-26 VITALS — BP 128/80 | HR 92 | Ht <= 58 in | Wt 112.0 lb

## 2022-02-26 DIAGNOSIS — I10 Essential (primary) hypertension: Secondary | ICD-10-CM

## 2022-02-26 DIAGNOSIS — E7800 Pure hypercholesterolemia, unspecified: Secondary | ICD-10-CM | POA: Diagnosis not present

## 2022-02-26 DIAGNOSIS — E1065 Type 1 diabetes mellitus with hyperglycemia: Secondary | ICD-10-CM | POA: Diagnosis not present

## 2022-02-26 NOTE — Progress Notes (Signed)
? ? ?Patient ID: Gabrielle Waller, female   DOB: 04-19-1966, 56 y.o.   MRN: [id number] ? ? ?Reason for Appointment : Follow up for Type 1 Diabetes ? ?History of Present Illness  ?        ?Diagnosis: Type 1 diabetes mellitus, date of diagnosis: 2001        ? ?Past history: Her blood sugar at diagnosis was 1056 and was started on insulin ?She is typically having difficulty controlling her diabetes because of cost of her medications and compliance ?She is also having relatively labile blood sugars ?Has difficulty understanding instructions for day-to-day management for diabetes and although she claims to be compliant with her insulin doses as prescribed she does not often check her blood sugars as directed and not clear how her diet affects her blood sugars ?  ?Recent history: ? ? INSULIN regimen is described as: Toujeo 24 units am 15 in pm daily; regular insulin mostly 25-30 units at meals ?  ? ?A1c is 9%, Usually over 8% ? ? Current problems and glucose  patterns: ?She has been able to get the freestyle Josephine Igo although it was expensive for her but she did not come in for training and she does not remember how to do this  ?Recently FASTING blood sugars are much higher and she apparently has reduced her evening Toujeo to 15 instead of 20 possibly when her sugars were low normal in the evenings  ?As before her blood sugars are highly VARIABLE ?They are averaging over 200 most of the time except somewhat better midday  ?Has difficulty knowing how much insulin to take for different meals with variable readings after dinner ?She does not think she is forgetting her insulin doses  ?She may still eat some fast food since she works at [employer] during the day ? ? ?Self-care:  Meals: 2-3 meals per day.  Breakfast 9-10 am; lunch  12pm and dinner 6-7 p.m.  ?  ?Glucose monitoring:  done  1-2 times a day         Glucometer: One Touch ?  ?Blood Glucose readings and averages from review of monitor as below   ? ?Blood sugar patterns:   ?Mostly high but variable readings, checked mostly in the mornings or evenings around 7 PM with some tendency to low sugars between 6-8 PM ? ? ?PRE-MEAL Fasting Lunch Dinner Bedtime Overall  ?Glucose range: 96-528  63-411    ?Mean/median: 229  215  234  ? ?POST-MEAL PC Breakfast PC Lunch PC Dinner  ?Glucose range:   57-597  ?Mean/median:     ?Previously: ? ? ?PRE-MEAL Fasting Lunch Dinner Bedtime Overall  ?Glucose range: 119-307 140-458  64-389 64-400  ?Mean/median:    225 265  ? ?POST-MEAL PC Breakfast PC Lunch PC Dinner  ?Glucose range:  157-490   ?Mean/median:  320   ? ? ?  ?Dietician visit: Most recent:. ?     ?She has seen diabetes educator several times in the past ?    ? ?Wt Readings from Last 3 Encounters:  ?02/26/22 112 lb (50.8 kg)  ?11/27/21 113 lb (51.3 kg)  ?08/21/21 111 lb (50.3 kg)  ? ? ? ?Lab Results  ?Component Value Date  ? HGBA1C 9.0 (H) 02/19/2022  ? HGBA1C 9.9 (H) 11/20/2021  ? HGBA1C 9.6 (H) 08/14/2021  ? ?Lab Results  ?Component Value Date  ? MICROALBUR 7.5 (H) 11/20/2021  ? LDLCALC 96 11/20/2021  ? CREATININE 0.79 02/19/2022  ? ?   ?Lab Results  ?  Component Value Date  ? MICRALBCREAT 7.6 11/20/2021  ? MICRALBCREAT 12.6 01/13/2021  ? ? ? ?Allergies as of 02/26/2022   ? ?   Reactions  ? Penicillins Itching  ? ?  ? ?  ?Medication List  ?  ? ?  ? Accurate as of February 26, 2022 11:59 PM. If you have any questions, ask your nurse or doctor.  ?  ?  ? ?  ? ?acetaminophen 325 MG tablet ?Commonly known as: TYLENOL ?Take 650 mg by mouth every 6 (six) hours as needed. ?  ?aspirin 325 MG EC tablet ?Take 325 mg by mouth daily. ?  ?Baqsimi One Pack 3 MG/DOSE Powd ?Generic drug: Glucagon ?Use in nostril for sever low sugar ?  ?famotidine 20 MG tablet ?Commonly known as: PEPCID ?Take 1 tablet (20 mg total) by mouth 2 (two) times daily. ?  ?FreeStyle Libre 3 Sensor Misc ?1 Device by Does not apply route every 14 (fourteen) days. Apply 1 sensor on upper arm every 14 days for continuous glucose monitoring ?   ?insulin regular 100 units/mL injection ?Commonly known as: NOVOLIN R ?Inject 0.25 mLs (25 Units total) into the skin 3 (three) times daily before meals. ?  ?INSULIN SYRINGE .5CC/31GX5/16" 31G X 5/16" 0.5 ML Misc ?Use to inject insulin ?  ?loratadine 10 MG tablet ?Commonly known as: CLARITIN ?Take 1 tablet (10 mg total) by mouth daily. ?  ?meloxicam 7.5 MG tablet ?Commonly known as: Mobic ?Take 1 tablet (7.5 mg total) by mouth daily as needed for pain. ?  ?OneTouch Delica Lancets 30G Misc ?1 each by Does not apply route in the morning, at noon, and at bedtime. Use onetouch delica lancets to check blood sugar 3 times daily. ?  ?OneTouch Verio test strip ?Generic drug: glucose blood ?USE  STRIP TO CHECK GLUCOSE THREE TIMES DAILY ?  ?rosuvastatin 40 MG tablet ?Commonly known as: Crestor ?Take 1 tablet (40 mg total) by mouth daily. ?  ?telmisartan 20 MG tablet ?Commonly known as: MICARDIS ?Take 1 tablet (20 mg total) by mouth daily. ?  ?TOUJEO SOLOSTAR [place] ?Inject 24 Units into the skin as directed. ?What changed: Another medication with the same name was removed. Continue taking this medication, and follow the directions you see here. ?  ?Vitamin D (Ergocalciferol) 1.25 MG (50000 UNIT) Caps capsule ?Commonly known as: DRISDOL ?Take 50,000 Units by mouth every 7 (seven) days. ?  ? ?  ? ? ?Allergies:  ?Allergies  ?Allergen Reactions  ? Penicillins Itching  ? ? ?Past Medical History:  ?Diagnosis Date  ? Diabetes mellitus without complication (HCC)   ? Kidney stones   ? ? ?Past Surgical History:  ?Procedure Laterality Date  ? APPENDECTOMY    ? ? ?Family History  ?Problem Relation Age of Onset  ? Diabetes Mother   ? ? ?Social History:  reports that she has never smoked. She has never used smokeless tobacco. She reports that she does not drink alcohol and does not use drugs. ? ?  ?Review of Systems:  ? ?She has a prior history of hyperthyroidism treated with Tapazole in 2001.   ?TSH level usually is slightly low without  increased T4 or T3. ? ? ?Lab Results  ?Component Value Date  ? FREET4 0.81 11/02/2019  ? FREET4 0.70 01/26/2019  ? FREET4 0.69 11/09/2016  ? TSH 0.28 (L) 05/15/2021  ? TSH 0.33 (L) 11/02/2019  ? TSH 0.33 (L) 01/26/2019  ? ? ?She has had significant hyperlipidemia at baseline including high triglycerides.  she was taking Lipitor 80 mg ?Has had inconsistent control of LDL ? ?Last LDL is below 100 after switching to Crestor 40 from Lipitor ? ? ?Lab Results  ?Component Value Date  ? CHOL 185 11/20/2021  ? CHOL 192 01/26/2019  ? CHOL 155 07/23/2018  ? ?Lab Results  ?Component Value Date  ? HDL 52.20 11/20/2021  ? HDL 48.10 01/26/2019  ? HDL 41.30 07/23/2018  ? ?Lab Results  ?Component Value Date  ? LDLCALC 96 11/20/2021  ? LDLCALC 120 (H) 01/26/2019  ? LDLCALC 93 07/23/2018  ? ?Lab Results  ?Component Value Date  ? TRIG 185.0 (H) 11/20/2021  ? TRIG 121.0 01/26/2019  ? TRIG 104.0 07/23/2018  ? ?Lab Results  ?Component Value Date  ? CHOLHDL 4 11/20/2021  ? CHOLHDL 4 01/26/2019  ? CHOLHDL 4 07/23/2018  ? ?Lab Results  ?Component Value Date  ? LDLDIRECT 93.0 05/15/2021  ? LDLDIRECT 105.0 07/08/2020  ? LDLDIRECT 107.0 02/01/2020  ? ? ?No history of numbness in the feet ? ? last exam was done at [facility] in 5/22 with background retinopathy ? ?HYPERTENSION: Blood pressure had been higher in 2022 and she is on Micardis 20 mg daily ? ?Has family history of hypertension ? ?BP Readings from Last 3 Encounters:  ?02/26/22 128/80  ?11/27/21 122/78  ?08/21/21 110/70  ? ? ? ? ?Physical Examination: ? ?BP 128/80   Pulse 92   Ht 4\' 9"  (1.448 m)   Wt 112 lb (50.8 kg)   BMI 24.24 kg/m?  ?     ? ?ASSESSMENT/PLAN:  ? ?Diabetes type 1:  ?See history of present illness for detailed discussion of  current management, blood sugar patterns and problems identified ? ?A1c is still out of target range at 9% ? ?She is on basal bolus insulin with Toujeo twice a day and regular insulin at meals ? ?Problems identified: ?Blood sugars are  significantly variable with no consistent pattern  ?Blood sugars are more variable in the evenings although not clear if some of the readings are after eating  ?She is unable to explain the variability in her blood su

## 2022-02-26 NOTE — Patient Instructions (Signed)
Toujeo 26 in am and 20 at nite ?

## 2022-02-27 ENCOUNTER — Other Ambulatory Visit: Payer: Self-pay | Admitting: Endocrinology

## 2022-02-27 MED ORDER — TOUJEO SOLOSTAR 300 UNIT/ML ~~LOC~~ SOPN
PEN_INJECTOR | SUBCUTANEOUS | 3 refills | Status: DC
Start: 2022-02-27 — End: 2022-08-08

## 2022-03-13 ENCOUNTER — Other Ambulatory Visit: Payer: Self-pay | Admitting: Endocrinology

## 2022-03-13 DIAGNOSIS — E7800 Pure hypercholesterolemia, unspecified: Secondary | ICD-10-CM

## 2022-03-16 ENCOUNTER — Encounter: Payer: Self-pay | Admitting: Podiatry

## 2022-03-27 DIAGNOSIS — E119 Type 2 diabetes mellitus without complications: Secondary | ICD-10-CM | POA: Diagnosis not present

## 2022-04-05 ENCOUNTER — Other Ambulatory Visit: Payer: Self-pay | Admitting: Endocrinology

## 2022-05-02 ENCOUNTER — Other Ambulatory Visit: Payer: Self-pay | Admitting: Endocrinology

## 2022-05-21 ENCOUNTER — Other Ambulatory Visit: Payer: Self-pay | Admitting: Endocrinology

## 2022-05-28 ENCOUNTER — Other Ambulatory Visit (INDEPENDENT_AMBULATORY_CARE_PROVIDER_SITE_OTHER): Payer: BC Managed Care – PPO

## 2022-05-28 DIAGNOSIS — E1065 Type 1 diabetes mellitus with hyperglycemia: Secondary | ICD-10-CM | POA: Diagnosis not present

## 2022-05-28 DIAGNOSIS — E7800 Pure hypercholesterolemia, unspecified: Secondary | ICD-10-CM

## 2022-05-28 LAB — GLUCOSE, RANDOM: Glucose, Bld: 311 mg/dL — ABNORMAL HIGH (ref 70–99)

## 2022-05-28 LAB — HEMOGLOBIN A1C: Hgb A1c MFr Bld: 8.7 % — ABNORMAL HIGH (ref 4.6–6.5)

## 2022-05-28 LAB — LDL CHOLESTEROL, DIRECT: Direct LDL: 98 mg/dL

## 2022-05-30 ENCOUNTER — Other Ambulatory Visit: Payer: Self-pay | Admitting: Endocrinology

## 2022-06-04 ENCOUNTER — Encounter: Payer: Self-pay | Admitting: Endocrinology

## 2022-06-04 ENCOUNTER — Ambulatory Visit: Payer: BC Managed Care – PPO | Admitting: Endocrinology

## 2022-06-04 VITALS — BP 144/72 | HR 104 | Ht 58.5 in | Wt 110.8 lb

## 2022-06-04 DIAGNOSIS — I10 Essential (primary) hypertension: Secondary | ICD-10-CM | POA: Diagnosis not present

## 2022-06-04 DIAGNOSIS — E7800 Pure hypercholesterolemia, unspecified: Secondary | ICD-10-CM | POA: Diagnosis not present

## 2022-06-04 DIAGNOSIS — E1065 Type 1 diabetes mellitus with hyperglycemia: Secondary | ICD-10-CM

## 2022-06-04 MED ORDER — GABAPENTIN 100 MG PO CAPS: 100.0000 mg | ORAL_CAPSULE | Freq: Two times a day (BID) | ORAL | 3 refills | Status: DC

## 2022-06-04 NOTE — Progress Notes (Unsigned)
Patient ID: Gabrielle Waller, female   DOB: 20-Sep-1966, 56 y.o.   MRN: [id number]   Reason for Appointment : Follow up for Type 1 Diabetes  History of Present Illness          Diagnosis: Type 1 diabetes mellitus, date of diagnosis: 2001         Past history: Her blood sugar at diagnosis was 1056 and was started on insulin She is typically having difficulty controlling her diabetes because of cost of her medications and compliance She is also having relatively labile blood sugars Has difficulty understanding instructions for day-to-day management for diabetes and although she claims to be compliant with her insulin doses as prescribed she does not often check her blood sugars as directed and not clear how her diet affects her blood sugars   Recent history:   INSULIN regimen is described as: Toujeo 26 units am  15 in pm daily; regular insulin mostly 25-30 units at meals    A1c is 9%, Usually over 8%   Current problems and glucose  patterns: She has been able to get the freestyle libre although it was expensive for her but she did not come in for training and she does not remember how to do this  Recently FASTING blood sugars are much higher and she apparently has reduced her evening Toujeo to 15 instead of 20 possibly when her sugars were low normal in the evenings  As before her blood sugars are highly VARIABLE They are averaging over 200 most of the time except somewhat better midday  Has difficulty knowing how much insulin to take for different meals with variable readings after dinner She does not think she is forgetting her insulin doses  She may still eat some fast food since she works at [employer] during the day   Self-care:  Meals: 2-3 meals per day.  Breakfast 9-10 am; lunch  12pm and dinner 6-7 p.m.    Glucose monitoring:  done  1-2 times a day         Glucometer: One Touch   Blood Glucose readings and averages from review of monitor as below    Blood sugar  patterns:  Mostly high but variable readings, checked mostly in the mornings or evenings around 7 PM with some tendency to low sugars between 6-8 PM   PRE-MEAL Fasting Lunch Dinner Bedtime Overall  Glucose range: 96-528  63-411    Mean/median: 229  215  234   POST-MEAL PC Breakfast PC Lunch PC Dinner  Glucose range:   57-597  Mean/median:        Dietician visit: Most recent:. ?     She has seen diabetes educator several times in the past      Wt Readings from Last 3 Encounters:  06/04/22 110 lb 12.8 oz (50.3 kg)  02/26/22 112 lb (50.8 kg)  11/27/21 113 lb (51.3 kg)     Lab Results  Component Value Date   HGBA1C 8.7 (H) 05/28/2022   HGBA1C 9.0 (H) 02/19/2022   HGBA1C 9.9 (H) 11/20/2021   Lab Results  Component Value Date   MICROALBUR 7.5 (H) 11/20/2021   LDLCALC 96 11/20/2021   CREATININE 0.79 02/19/2022      Lab Results  Component Value Date   MICRALBCREAT 7.6 11/20/2021   MICRALBCREAT 12.6 01/13/2021     Allergies as of 06/04/2022       Reactions   Penicillins Itching        Medication List  Accurate as of June 04, 2022  4:40 PM. If you have any questions, ask your nurse or doctor.          acetaminophen 325 MG tablet Commonly known as: TYLENOL Take 650 mg by mouth every 6 (six) hours as needed.   aspirin EC 325 MG tablet Take 325 mg by mouth daily.   Baqsimi One Pack 3 MG/DOSE Powd Generic drug: Glucagon Use in nostril for sever low sugar   famotidine 20 MG tablet Commonly known as: PEPCID Take 1 tablet (20 mg total) by mouth 2 (two) times daily.   FreeStyle Libre 3 Sensor Misc 1 Device by Does not apply route every 14 (fourteen) days. Apply 1 sensor on upper arm every 14 days for continuous glucose monitoring   INSULIN SYRINGE .5CC/31GX5/16" 31G X 5/16" 0.5 ML Misc Use to inject insulin   loratadine 10 MG tablet Commonly known as: CLARITIN Take 1 tablet (10 mg total) by mouth daily.   meloxicam 7.5 MG tablet Commonly  known as: Mobic Take 1 tablet (7.5 mg total) by mouth daily as needed for pain.   NovoLIN R ReliOn 100 units/mL injection Generic drug: insulin regular INJECT 25 UNITS INTO THE SKIN 3 TIMES DAILY BEFORE MEALS   OneTouch Delica Lancets 30G Misc 1 each by Does not apply route in the morning, at noon, and at bedtime. Use onetouch delica lancets to check blood sugar 3 times daily.   OneTouch Verio test strip Generic drug: glucose blood USE 1 STRIP TO CHECK GLUCOSE THREE TIMES DAILY   rosuvastatin 40 MG tablet Commonly known as: CRESTOR Take 1 tablet by mouth once daily   telmisartan 20 MG tablet Commonly known as: MICARDIS Take 1 tablet by mouth once daily   Toujeo SoloStar 300 UNIT/ML Solostar Pen Generic drug: insulin glargine (1 Unit Dial) 26 units in the morning and 20 units at dinnertime   Vitamin D (Ergocalciferol) 1.25 MG (50000 UNIT) Caps capsule Commonly known as: DRISDOL Take 50,000 Units by mouth every 7 (seven) days.        Allergies:  Allergies  Allergen Reactions   Penicillins Itching    Past Medical History:  Diagnosis Date   Diabetes mellitus without complication (HCC)    Kidney stones     Past Surgical History:  Procedure Laterality Date   APPENDECTOMY      Family History  Problem Relation Age of Onset   Diabetes Mother     Social History:  reports that she has never smoked. She has never used smokeless tobacco. She reports that she does not drink alcohol and does not use drugs.    Review of Systems:   She has a prior history of hyperthyroidism treated with Tapazole in 2001.   TSH level usually is slightly low without increased T4 or T3.   Lab Results  Component Value Date   FREET4 0.81 11/02/2019   FREET4 0.70 01/26/2019   FREET4 0.69 11/09/2016   TSH 0.28 (L) 05/15/2021   TSH 0.33 (L) 11/02/2019   TSH 0.33 (L) 01/26/2019    She has had significant hyperlipidemia at baseline including high triglycerides. she was taking Lipitor  80 mg Has had inconsistent control of LDL  Last LDL is below 100 after switching to Crestor 40 from Lipitor   Lab Results  Component Value Date   CHOL 185 11/20/2021   CHOL 192 01/26/2019   CHOL 155 07/23/2018   Lab Results  Component Value Date   HDL 52.20 11/20/2021   HDL  48.10 01/26/2019   HDL 41.30 07/23/2018   Lab Results  Component Value Date   LDLCALC 96 11/20/2021   LDLCALC 120 (H) 01/26/2019   LDLCALC 93 07/23/2018   Lab Results  Component Value Date   TRIG 185.0 (H) 11/20/2021   TRIG 121.0 01/26/2019   TRIG 104.0 07/23/2018   Lab Results  Component Value Date   CHOLHDL 4 11/20/2021   CHOLHDL 4 01/26/2019   CHOLHDL 4 07/23/2018   Lab Results  Component Value Date   LDLDIRECT 98.0 05/28/2022   LDLDIRECT 93.0 05/15/2021   LDLDIRECT 105.0 07/08/2020    No history of numbness in the feet  PAIN in the legs: She says she has mostly pain in the lower legs and feet with bone pain also but this is mostly when she is up and about at work and not at night when she is resting.  Occasionally will have some pins-and-needles also She thinks she has some swelling occasionally   last exam was done at [facility] in 5/22 with background retinopathy  HYPERTENSION: Generally controlled with Micardis 20 mg daily  Not checking at home  BP Readings from Last 3 Encounters:  06/04/22 (!) 144/72  02/26/22 128/80  11/27/21 122/78      Physical Examination:  BP (!) 144/72   Pulse (!) 104   Ht 4' 10.5" (1.486 m)   Wt 110 lb 12.8 oz (50.3 kg)   SpO2 98%   BMI 22.76 kg/m       Diabetic Foot Exam - Simple   Simple Foot Form Diabetic Foot exam was performed with the following findings: Yes   Visual Inspection No deformities, no ulcerations, no other skin breakdown bilaterally: Yes Sensation Testing Intact to touch and monofilament testing bilaterally: Yes Pulse Check Posterior Tibialis and Dorsalis pulse intact bilaterally: Yes Comments    Vision  since mildly decreased in both big toes  No swelling or tenderness of the lower legs or calf muscles  ASSESSMENT/PLAN:   Diabetes type 1:  See history of present illness for detailed discussion of  current management, blood sugar patterns and problems identified  A1c is still out of target range at 9%  She is on basal bolus insulin with Toujeo twice a day and regular insulin at meals  Problems identified: Blood sugars are significantly variable with no consistent pattern  Blood sugars are more variable in the evenings although not clear if some of the readings are after eating  She is unable to explain the variability in her blood sugars at different times  As before she is not able to afford analog insulin which might work better for her  With her taking only 15 insulin of 20 units of Toujeo in the evenings her morning sugars are as high as 528 now  Not able to count carbohydrates and is taking somewhat empiric doses of mealtime insulin coverage with variable results Has not started the freestyle libre sensor even though she has this at home   Recommendations for diabetes: She will need to schedule appointment with the nurse educator and phone number given  She needs instructions on how to use the freestyle libre as well as review her blood sugar patterns again  Discussed the need to check blood sugars more consistently by rotation at different times especially after supper  Also reminded her not to take any regular insulin late at night if she forgets before dinner regardless of blood sugar  Toujeo will be 24 units twice a day  to help with overnight readings and also reduce tendency to low normal readings in the late afternoon Does need more advice how to adjust her mealtime dose Consider NovoLog or Humalog if covered on the next visit; unlikely that she would be able to handle an insulin pump be  HYPERTENSION: Usually well controlled with telmisartan 20 mg daily  LIPIDS: LDL  below 100 with max dose Crestor  Total visit time including counseling = 30 minutes  Patient Instructions  Toujeo 24 units both am and pm  Check blood sugars on waking up 4-5 days a week  Also check blood sugars about 2 hours after meals and do this after different meals by rotation  Recommended blood sugar levels on waking up are 90-130 and about 2 hours after meal is 130-180  Please bring your blood sugar monitor to each visit, thank you     Elayne Snare 06/04/2022, 4:40 PM

## 2022-06-04 NOTE — Patient Instructions (Signed)
Toujeo 24 units both am and pm  Check blood sugars on waking up 4-5 days a week  Also check blood sugars about 2 hours after meals and do this after different meals by rotation  Recommended blood sugar levels on waking up are 90-130 and about 2 hours after meal is 130-180  Please bring your blood sugar monitor to each visit, thank you

## 2022-06-18 ENCOUNTER — Other Ambulatory Visit: Payer: Self-pay | Admitting: Endocrinology

## 2022-06-18 DIAGNOSIS — E7800 Pure hypercholesterolemia, unspecified: Secondary | ICD-10-CM

## 2022-07-18 ENCOUNTER — Other Ambulatory Visit: Payer: Self-pay | Admitting: Endocrinology

## 2022-07-19 ENCOUNTER — Ambulatory Visit: Payer: BC Managed Care – PPO | Attending: Family Medicine | Admitting: Family Medicine

## 2022-07-19 ENCOUNTER — Encounter: Payer: Self-pay | Admitting: Family Medicine

## 2022-07-19 VITALS — BP 131/76 | HR 95 | Temp 98.0°F | Ht <= 58 in | Wt 112.6 lb

## 2022-07-19 DIAGNOSIS — R058 Other specified cough: Secondary | ICD-10-CM | POA: Diagnosis not present

## 2022-07-19 DIAGNOSIS — E1042 Type 1 diabetes mellitus with diabetic polyneuropathy: Secondary | ICD-10-CM

## 2022-07-19 DIAGNOSIS — Z1211 Encounter for screening for malignant neoplasm of colon: Secondary | ICD-10-CM

## 2022-07-19 DIAGNOSIS — Z1159 Encounter for screening for other viral diseases: Secondary | ICD-10-CM

## 2022-07-19 DIAGNOSIS — E1069 Type 1 diabetes mellitus with other specified complication: Secondary | ICD-10-CM

## 2022-07-19 DIAGNOSIS — E782 Mixed hyperlipidemia: Secondary | ICD-10-CM | POA: Diagnosis not present

## 2022-07-19 MED ORDER — CETIRIZINE HCL 10 MG PO TABS: 10.0000 mg | ORAL_TABLET | Freq: Every day | ORAL | 1 refills | Status: DC

## 2022-07-19 MED ORDER — FLUTICASONE PROPIONATE 50 MCG/ACT NA SUSP: 2.0000 | Freq: Every day | NASAL | 1 refills | Status: DC

## 2022-07-19 MED ORDER — GABAPENTIN 300 MG PO CAPS: 300.0000 mg | ORAL_CAPSULE | Freq: Every day | ORAL | 1 refills | Status: DC

## 2022-07-19 NOTE — Progress Notes (Signed)
Subjective:  Patient ID: Gabrielle Waller,  Age: 56 y.o. female    DOB: 10-07-1966  Age: 56 y.o. MRN: [id number]  CC: New Patient (Initial Visit)   HPI Gabrielle Waller is a 56 y.o. year old female with a history of hypertension, hyperlipidemia, type 2 diabetes mellitus (A1c 8.7) Diabetes is managed by Endocrine - Dr. Dwyane Dee She presents today to establish care accompanied by an in person interpreter.  Interval History:   She Complains of pain in her feet for 1-2 months nausea the anterior part of the sole of her foot worse in the left , is constant and is stabbing. Gabapentin appears on her med list but she has not been taking it.  Pain is worse when she has been on her feet for prolonged hours which she does all day. Last seen by her endocrinologist 6 weeks ago.  She also Complains of cough which occurs in the middle of her sleep and associated post nasal drip, she has dry nasal discharge but no sinus pressure, short of breath , wheezing.  Past Medical History:  Diagnosis Date   Diabetes mellitus without complication ([place])    Kidney stones     Past Surgical History:  Procedure Laterality Date   APPENDECTOMY      Family History  Problem Relation Age of Onset   Diabetes Mother     Social History   Socioeconomic History   Marital status: Married    Spouse name: Not on file   Number of children: 4   Years of education: 12   Highest education level: Not on file  Occupational History   Occupation: Cleaning  Tobacco Use   Smoking status: Never   Smokeless tobacco: Never  Vaping Use   Vaping Use: Never used  Substance and Sexual Activity   Alcohol use: No   Drug use: No   Sexual activity: Not on file  Other Topics Concern   Not on file  Social History Narrative   Fun: walks, listening to music   Denies abuse and feels safe at home.   Social Determinants of Health   Financial Resource Strain: Not on file  Food Insecurity: Not on file  Transportation Needs: Not on file  Physical  Activity: Not on file  Stress: Not on file  Social Connections: Not on file    Allergies  Allergen Reactions   Penicillins Itching    Outpatient Medications Prior to Visit  Medication Sig Dispense Refill   acetaminophen (TYLENOL) 325 MG tablet Take 650 mg by mouth every 6 (six) hours as needed.     aspirin 325 MG EC tablet Take 325 mg by mouth daily.     Continuous Blood Gluc Sensor (FREESTYLE LIBRE 3 SENSOR) MISC 1 Device by Does not apply route every 14 (fourteen) days. Apply 1 sensor on upper arm every 14 days for continuous glucose monitoring 2 each 2   famotidine (PEPCID) 20 MG tablet Take 1 tablet (20 mg total) by mouth 2 (two) times daily. 30 tablet 0   Glucagon (BAQSIMI ONE PACK) 3 MG/DOSE POWD Use in nostril for sever low sugar 1 each 1   insulin glargine, 1 Unit Dial, (TOUJEO SOLOSTAR) 300 UNIT/ML Solostar Pen 26 units in the morning and 20 units at dinnertime 4.5 mL 3   Insulin Syringe-Needle U-100 (INSULIN SYRINGE .5CC/31GX5/16") 31G X 5/16" 0.5 ML MISC Use to inject insulin 90 each 3   NOVOLIN R RELION 100 UNIT/ML injection INJECT 25 UNITS SUBCUTANEOUSLY THREE TIMES DAILY BEFORE MEAL(S) 20 mL 3  OneTouch Delica Lancets 70Y MISC 1 each by Does not apply route in the morning, at noon, and at bedtime. Use onetouch delica lancets to check blood sugar 3 times daily. 100 each 2   ONETOUCH VERIO test strip USE  STRIP TO CHECK GLUCOSE THREE TIMES DAILY 100 each 0   rosuvastatin (CRESTOR) 40 MG tablet Take 1 tablet by mouth once daily 30 tablet 3   telmisartan (MICARDIS) 20 MG tablet Take 1 tablet by mouth once daily 90 tablet 0   loratadine (CLARITIN) 10 MG tablet Take 1 tablet (10 mg total) by mouth daily. (Patient not taking: Reported on 07/19/2022) 90 tablet 0   meloxicam (MOBIC) 7.5 MG tablet Take 1 tablet (7.5 mg total) by mouth daily as needed for pain. (Patient not taking: Reported on 07/19/2022) 30 tablet 0   Vitamin D, Ergocalciferol, (DRISDOL) 50000 units CAPS capsule Take  50,000 Units by mouth every 7 (seven) days. (Patient not taking: Reported on 07/19/2022)     gabapentin (NEURONTIN) 100 MG capsule Take 1 capsule (100 mg total) by mouth 2 (two) times daily. (Patient not taking: Reported on 07/19/2022) 60 capsule 3   No facility-administered medications prior to visit.     ROS Review of Systems  Constitutional:  Negative for activity change, appetite change and fatigue.  HENT:  Negative for congestion, sinus pressure and sore throat.   Eyes:  Negative for visual disturbance.  Respiratory:  Positive for cough. Negative for chest tightness, shortness of breath and wheezing.   Cardiovascular:  Negative for chest pain and palpitations.  Gastrointestinal:  Negative for abdominal distention, abdominal pain and constipation.  Endocrine: Negative for polydipsia.  Genitourinary:  Negative for dysuria and frequency.  Musculoskeletal:        See HPI  Skin:  Negative for rash.  Neurological:  Negative for tremors, light-headedness and numbness.  Hematological:  Does not bruise/bleed easily.  Psychiatric/Behavioral:  Negative for agitation and behavioral problems.     Objective:  BP 131/76   Pulse 95   Temp 98 F (36.7 C) (Oral)   Ht _0  (1.448 m)   Wt 112 lb 9.6 oz (51.1 kg)   SpO2 100%   BMI 24.37 kg/m      07/19/2022   11:22 AM 07/19/2022   10:59 AM 06/04/2022    4:06 PM  BP/Weight  Systolic BP 174 944 967  Diastolic BP 76 79 72  Wt. (Lbs)  112.6 110.8  BMI  24.37 kg/m2 22.76 kg/m2      Physical Exam Constitutional:      Appearance: She is well-developed.  Cardiovascular:     Rate and Rhythm: Normal rate.     Heart sounds: Normal heart sounds. No murmur heard. Pulmonary:     Effort: Pulmonary effort is normal.     Breath sounds: Normal breath sounds. No wheezing or rales.  Chest:     Chest wall: No tenderness.  Abdominal:     General: Bowel sounds are normal. There is no distension.     Palpations: Abdomen is soft. There is no mass.      Tenderness: There is no abdominal tenderness.  Musculoskeletal:        General: Normal range of motion.     Right lower leg: No edema.     Left lower leg: No edema.  Neurological:     Mental Status: She is alert and oriented to person, place, and time.  Psychiatric:        Mood and Affect: Mood  normal.        Latest Ref Rng & Units 05/28/2022    9:05 AM 02/19/2022   10:31 AM 11/20/2021   10:36 AM  CMP  Glucose 70 - 99 mg/dL 311  430  218   BUN 6 - 23 mg/dL  19  22   Creatinine 0.40 - 1.20 mg/dL  0.79  0.75   Sodium 135 - 145 mEq/L  136  141   Potassium 3.5 - 5.1 mEq/L  4.7  4.5   Chloride 96 - 112 mEq/L  101  106   CO2 19 - 32 mEq/L  29  28   Calcium 8.4 - 10.5 mg/dL  9.5  9.4   Total Protein 6.0 - 8.3 g/dL   6.9   Total Bilirubin 0.2 - 1.2 mg/dL   0.3   Alkaline Phos 39 - 117 U/L   104   AST 0 - 37 U/L   16   ALT 0 - 35 U/L   16     Lipid Panel     Component Value Date/Time   CHOL 185 11/20/2021 1036   TRIG 185.0 (H) 11/20/2021 1036   HDL 52.20 11/20/2021 1036   CHOLHDL 4 11/20/2021 1036   VLDL 37.0 11/20/2021 1036   LDLCALC 96 11/20/2021 1036   LDLDIRECT 98.0 05/28/2022 0905    CBC    Component Value Date/Time   WBC 5.7 01/13/2009 2229   RBC 4.71 01/13/2009 2229   HGB 15.0 01/13/2009 2229   HCT 43.3 01/13/2009 2229   PLT 290 01/13/2009 2229   MCV 92.0 01/13/2009 2229   MCHC 34.5 01/13/2009 2229   RDW 12.6 01/13/2009 2229   LYMPHSABS 0.7 01/13/2009 2229   MONOABS 0.3 01/13/2009 2229   EOSABS 0.0 01/13/2009 2229   BASOSABS 0.0 01/13/2009 2229    Lab Results  Component Value Date   HGBA1C 8.7 (H) 05/28/2022    Assessment & Plan:  1. 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  Dispense: 90 capsule; Refill: 1  2. Mixed  hyperlipidemia Controlled on lipid panel in 11/2021 Continue statin Low-cholesterol diet - CMP14+EGFR  3. Screening for viral disease - HCV Ab w Reflex to Quant PCR - HIV Antibody (routine testing w rflx)  4. Other cough Likely of sinus etiology We will place on antihistamine and nasal steroid - cetirizine (ZYRTEC) 10 MG tablet; Take 1 tablet (10 mg total) by mouth daily.  Dispense: 30 tablet; Refill: 1  5. Type 1 diabetes mellitus with other specified complication ([place]) Uncontrolled with A1c of 8.7 Continue Toujeo and Novolin  Management as per endocrinology  6. Screening for colon cancer - Ambulatory referral to Gastroenterology   Health Care Maintenance: States she is up to date [facility] [place] appointment comes up in 10/2022.  Meds ordered this encounter  Medications   gabapentin (NEURONTIN) 300 MG capsule    Sig: Take 1 capsule (300 mg total) by mouth at bedtime.    Dispense:  90 capsule    Refill:  1   cetirizine (ZYRTEC) 10 MG tablet    Sig: Take 1 tablet (10 mg total) by mouth daily.    Dispense:  30 tablet    Refill:  1    Follow-up:  Return in about 6 months (around 01/17/2023) for Chronic medical conditions.       Charlott Rakes, MD, FAAFP. [facility] [place], [place]   07/19/2022, 12:44 PM

## 2022-07-19 NOTE — Patient Instructions (Signed)

## 2022-07-19 NOTE — Progress Notes (Signed)
Pain in bottom of feet. Has bad cough at night.

## 2022-07-20 LAB — CMP14+EGFR
ALT: 21 IU/L (ref 0–32)
AST: 22 IU/L (ref 0–40)
Albumin/Globulin Ratio: 1.7 (ref 1.2–2.2)
Albumin: 4.5 g/dL (ref 3.8–4.9)
Alkaline Phosphatase: 166 IU/L — ABNORMAL HIGH (ref 44–121)
BUN/Creatinine Ratio: 25 — ABNORMAL HIGH (ref 9–23)
BUN: 17 mg/dL (ref 6–24)
Bilirubin Total: 0.3 mg/dL (ref 0.0–1.2)
CO2: 23 mmol/L (ref 20–29)
Calcium: 10.1 mg/dL (ref 8.7–10.2)
Chloride: 101 mmol/L (ref 96–106)
Creatinine, Ser: 0.69 mg/dL (ref 0.57–1.00)
Globulin, Total: 2.7 g/dL (ref 1.5–4.5)
Glucose: 290 mg/dL — ABNORMAL HIGH (ref 70–99)
Potassium: 4.6 mmol/L (ref 3.5–5.2)
Sodium: 140 mmol/L (ref 134–144)
Total Protein: 7.2 g/dL (ref 6.0–8.5)
eGFR: 102 mL/min/{1.73_m2} (ref >59)

## 2022-07-20 LAB — HCV AB W REFLEX TO QUANT PCR: HCV Ab: NONREACTIVE

## 2022-07-20 LAB — HIV ANTIBODY (ROUTINE TESTING W REFLEX): HIV Screen 4th Generation wRfx: NONREACTIVE

## 2022-07-20 LAB — HCV INTERPRETATION

## 2022-08-08 ENCOUNTER — Other Ambulatory Visit: Payer: Self-pay

## 2022-08-08 DIAGNOSIS — E1065 Type 1 diabetes mellitus with hyperglycemia: Secondary | ICD-10-CM

## 2022-08-08 MED ORDER — TOUJEO SOLOSTAR 300 UNIT/ML ~~LOC~~ SOPN: PEN_INJECTOR | SUBCUTANEOUS | 3 refills | Status: DC

## 2022-08-18 ENCOUNTER — Other Ambulatory Visit: Payer: Self-pay | Admitting: Endocrinology

## 2022-08-27 ENCOUNTER — Other Ambulatory Visit (INDEPENDENT_AMBULATORY_CARE_PROVIDER_SITE_OTHER): Payer: BC Managed Care – PPO

## 2022-08-27 DIAGNOSIS — E1065 Type 1 diabetes mellitus with hyperglycemia: Secondary | ICD-10-CM

## 2022-08-27 LAB — HEMOGLOBIN A1C: Hgb A1c MFr Bld: 8.7 % — ABNORMAL HIGH (ref 4.6–6.5)

## 2022-08-27 LAB — GLUCOSE, RANDOM: Glucose, Bld: 200 mg/dL — ABNORMAL HIGH (ref 70–99)

## 2022-09-03 ENCOUNTER — Ambulatory Visit: Payer: BC Managed Care – PPO | Admitting: Endocrinology

## 2022-09-03 ENCOUNTER — Encounter: Payer: Self-pay | Admitting: Endocrinology

## 2022-09-03 VITALS — BP 138/82 | HR 93 | Ht 58.5 in | Wt 117.4 lb

## 2022-09-03 DIAGNOSIS — E1065 Type 1 diabetes mellitus with hyperglycemia: Secondary | ICD-10-CM

## 2022-09-03 DIAGNOSIS — E7800 Pure hypercholesterolemia, unspecified: Secondary | ICD-10-CM

## 2022-09-03 DIAGNOSIS — I10 Essential (primary) hypertension: Secondary | ICD-10-CM

## 2022-09-03 MED ORDER — OMNIPOD 5 DEXG7G6 INTRO GEN 5 KIT: 1.0000 | PACK | Freq: Once | 0 refills | Status: AC

## 2022-09-03 MED ORDER — OMNIPOD 5 DEXG7G6 PODS GEN 5 MISC: 1.0000 | 3 refills | Status: DC

## 2022-09-03 NOTE — Patient Instructions (Signed)
Toujeo 28 units am, 24 units in the evening

## 2022-09-03 NOTE — Progress Notes (Unsigned)
Patient ID: Gabrielle Waller, female   DOB: 07-08-1966, 56 y.o.   MRN: [id number]   Reason for Appointment : Follow up for Type 1 Diabetes  History of Present Illness          Diagnosis: Type 1 diabetes mellitus, date of diagnosis: 2001         Past history: Her blood sugar at diagnosis was 1056 and was started on insulin She is typically having difficulty controlling her diabetes because of cost of her medications and compliance She is also having relatively labile blood sugars Has difficulty understanding instructions for day-to-day management for diabetes and although she claims to be compliant with her insulin doses as prescribed she does not often check her blood sugars as directed and not clear how her diet affects her blood sugars   Recent history:   INSULIN regimen is described as: Toujeo 26-30 units am, 20 units in the evening  regular insulin mostly 20-25 units at meals    A1c is 8.7 % again, Usually over 8%   Current problems and glucose  patterns:  She still has not started using the freestyle libre that she has and has not scheduled an appointment with CDE for follow-up on this Her blood sugars are highly variable She is also inconsistent in her answers about how much insulin she is taking  She thinks she is taking more Toujeo than before and even though she says she takes 30 units in the evening last evening she only take 20 Also she generally takes 25 units of regular insulin before meals but this morning she took only 20 for no apparent reason She will sometimes take 20 units at lunch because of being active at work However she is sometimes eating fast food such as fish sandwiches and big Macs from [employer] In the last 2 weeks or so most of her blood sugars are significantly high except occasionally in the evenings after 7-8 PM  She did have some hypoglycemia overnight but this was 3 to 4 weeks ago on 2 occasions with lowest blood sugar 43 Not much exercise, she  works at [employer] during the day   Self-care:  Meals: 2-3 meals per day.  Breakfast 9-10 am; lunch  12pm and dinner 6-7 p.m.    Glucose monitoring:  done  1-2 times a day         Glucometer: One Touch   Blood Glucose readings and averages from review of monitor as below     PRE-MEAL Fasting Lunch Dinner Bedtime Overall  Glucose range: 93-411  64-297 35-240   Mean/median: 241  200  197   POST-MEAL PC Breakfast PC Lunch PC Dinner  Glucose range:   89-301  Mean/median:   160   Previously  PRE-MEAL Fasting Lunch Dinner Overnight Overall  Glucose range: 68-447 124-352 73-244 45, 61   Mean/median: 233  190  227   POST-MEAL PC Breakfast PC Lunch PC Dinner  Glucose range:   66-496  Mean/median:   161   Previously:  PRE-MEAL Fasting Lunch Dinner Bedtime Overall  Glucose range: 96-528  63-411    Mean/median: 229  215  234   POST-MEAL PC Breakfast PC Lunch PC Dinner  Glucose range:   57-597  Mean/median:        Dietician visit: Most recent:. ?     She has seen diabetes educator several times in the past      Wt Readings from Last 3 Encounters:  09/03/22 117 lb 6.4  oz (53.3 kg)  07/19/22 112 lb 9.6 oz (51.1 kg)  06/04/22 110 lb 12.8 oz (50.3 kg)     Lab Results  Component Value Date   HGBA1C 8.7 (H) 08/27/2022   HGBA1C 8.7 (H) 05/28/2022   HGBA1C 9.0 (H) 02/19/2022   Lab Results  Component Value Date   MICROALBUR 7.5 (H) 11/20/2021   LDLCALC 96 11/20/2021   CREATININE 0.69 07/19/2022      Lab Results  Component Value Date   MICRALBCREAT 7.6 11/20/2021   MICRALBCREAT 12.6 01/13/2021     Allergies as of 09/03/2022       Reactions   Penicillins Itching        Medication List        Accurate as of September 03, 2022  9:13 PM. If you have any questions, ask your nurse or doctor.          acetaminophen 325 MG tablet Commonly known as: TYLENOL Take 650 mg by mouth every 6 (six) hours as needed.   aspirin EC 325 MG tablet Take 325 mg by mouth  daily.   Baqsimi One Pack 3 MG/DOSE Powd Generic drug: Glucagon Use in nostril for sever low sugar   cetirizine 10 MG tablet Commonly known as: ZYRTEC Take 1 tablet (10 mg total) by mouth daily.   famotidine 20 MG tablet Commonly known as: PEPCID Take 1 tablet (20 mg total) by mouth 2 (two) times daily.   fluticasone 50 MCG/ACT nasal spray Commonly known as: FLONASE Place 2 sprays into both nostrils daily.   FreeStyle Libre 3 Sensor Misc 1 Device by Does not apply route every 14 (fourteen) days. Apply 1 sensor on upper arm every 14 days for continuous glucose monitoring   gabapentin 300 MG capsule Commonly known as: NEURONTIN Take 1 capsule (300 mg total) by mouth at bedtime.   INSULIN SYRINGE .5CC/31GX5/16" 31G X 5/16" 0.5 ML Misc Use to inject insulin   loratadine 10 MG tablet Commonly known as: CLARITIN Take 1 tablet (10 mg total) by mouth daily.   meloxicam 7.5 MG tablet Commonly known as: Mobic Take 1 tablet (7.5 mg total) by mouth daily as needed for pain.   NovoLIN R ReliOn 100 units/mL injection Generic drug: insulin regular INJECT 25 UNITS SUBCUTANEOUSLY THREE TIMES DAILY BEFORE MEAL(S)   OneTouch Delica Lancets 30G Misc 1 each by Does not apply route in the morning, at noon, and at bedtime. Use onetouch delica lancets to check blood sugar 3 times daily.   OneTouch Verio test strip Generic drug: glucose blood USE  STRIP TO CHECK GLUCOSE THREE TIMES DAILY   rosuvastatin 40 MG tablet Commonly known as: CRESTOR Take 1 tablet by mouth once daily   telmisartan 20 MG tablet Commonly known as: MICARDIS Take 1 tablet by mouth once daily   Toujeo SoloStar 300 UNIT/ML Solostar Pen Generic drug: insulin glargine (1 Unit Dial) 26 units in the morning and 20 units at dinnertime   Vitamin D (Ergocalciferol) 1.25 MG (50000 UNIT) Caps capsule Commonly known as: DRISDOL Take 50,000 Units by mouth every 7 (seven) days.        Allergies:  Allergies  Allergen  Reactions   Penicillins Itching    Past Medical History:  Diagnosis Date   Diabetes mellitus without complication (HCC)    Kidney stones     Past Surgical History:  Procedure Laterality Date   APPENDECTOMY      Family History  Problem Relation Age of Onset   Diabetes Mother  Social History:  reports that she has never smoked. She has never used smokeless tobacco. She reports that she does not drink alcohol and does not use drugs.    Review of Systems:   She has a prior history of hyperthyroidism treated with Tapazole in 2001.   TSH level usually is slightly low without increased T4 or T3.   Lab Results  Component Value Date   FREET4 0.81 11/02/2019   FREET4 0.70 01/26/2019   FREET4 0.69 11/09/2016   TSH 0.28 (L) 05/15/2021   TSH 0.33 (L) 11/02/2019   TSH 0.33 (L) 01/26/2019    She has had significant hyperlipidemia at baseline including high triglycerides. she was taking Lipitor 80 mg Has had inconsistent control of LDL  Last LDL is below 100 after switching to Crestor 40 from Lipitor   Lab Results  Component Value Date   CHOL 185 11/20/2021   CHOL 192 01/26/2019   CHOL 155 07/23/2018   Lab Results  Component Value Date   HDL 52.20 11/20/2021   HDL 48.10 01/26/2019   HDL 41.30 07/23/2018   Lab Results  Component Value Date   LDLCALC 96 11/20/2021   LDLCALC 120 (H) 01/26/2019   LDLCALC 93 07/23/2018   Lab Results  Component Value Date   TRIG 185.0 (H) 11/20/2021   TRIG 121.0 01/26/2019   TRIG 104.0 07/23/2018   Lab Results  Component Value Date   CHOLHDL 4 11/20/2021   CHOLHDL 4 01/26/2019   CHOLHDL 4 07/23/2018   Lab Results  Component Value Date   LDLDIRECT 98.0 05/28/2022   LDLDIRECT 93.0 05/15/2021   LDLDIRECT 105.0 07/08/2020    No history of numbness in the feet  PAIN in the legs: She says she has mostly pain in the lower legs and feet with bone pain also but this is mostly when she is up and about at work and not at night  when she is resting.  Occasionally will have some pins-and-needles also She thinks she has some swelling occasionally   last exam was [facility] in 5/22 with background retinopathy  HYPERTENSION: Generally controlled with Micardis 20 mg daily  Not checking at home  BP Readings from Last 3 Encounters:  09/03/22 138/82  07/19/22 131/76  06/04/22 (!) 144/72      Physical Examination:  BP 138/82   Pulse 93   Ht 4' 10.5" (1.486 m)   Wt 117 lb 6.4 oz (53.3 kg)   SpO2 99%   BMI 24.12 kg/m        ASSESSMENT/PLAN:   Diabetes type 1:  See history of present illness for detailed discussion of  current management, blood sugar patterns and problems identified  A1c is still out of target range at 8.7  She is on basal bolus insulin with Toujeo twice a day and regular insulin at meals  Problems identified: Blood sugars are inconsistent However mostly has high readings recently when checked Currently she is mostly checking blood sugars in the mornings and at variable times in the evenings and difficult to know which readings are after meals She is sometimes adjusting her insulin doses arbitrarily and likely is not getting enough insulin most of the time  Diet can be better and she needs to avoid fast food with high fat intake Even with CGM is unlikely that she will have better control because of blood sugar variability   Recommendations for diabetes: She will need to schedule appointment with the nurse educator for help with  daily insulin management education and starting the freestyle libre again Prescription for OmniPod 5 pump will be sent and if she has coverage will consider this to help with her level of control although may first need to try her on Dexcom alone Take stable doses of insulin and do not change arbitrarily Stop eating high-fat fast food For now we will increase her morning Toujeo to 28 and take a stable dose of 20 4 in the evening To call if  fasting readings are consistently high She will take a stable dose of 25 units of regular insulin before every meal  HYPERTENSION: well controlled with telmisartan 20 mg daily   Total visit time including counseling = 30 minutes  Patient Instructions  Toujeo 28 units am, 24 units in the evening      Reather Littler 09/03/2022, 9:13 PM

## 2022-09-04 ENCOUNTER — Other Ambulatory Visit: Payer: Self-pay | Admitting: Endocrinology

## 2022-09-20 ENCOUNTER — Encounter: Payer: Self-pay | Admitting: Internal Medicine

## 2022-10-01 ENCOUNTER — Other Ambulatory Visit: Payer: Self-pay | Admitting: Endocrinology

## 2022-10-02 ENCOUNTER — Telehealth: Payer: Self-pay

## 2022-10-02 ENCOUNTER — Telehealth: Payer: Self-pay | Admitting: *Deleted

## 2022-10-02 NOTE — Telephone Encounter (Signed)
Gabrielle Waller this patient has an insulin pump. Her previsit is 10/09/22 and colonoscopy is 10/22/22. Could you reach out to her prescribing MD for orders please. Thank you Claris Gladden:)

## 2022-10-02 NOTE — Telephone Encounter (Signed)
Medication Management    Request for insulin pump management for an endoscopy procedure.  What type of surgery is being performed?     colonoscopy  When is this surgery scheduled?     10/22/22    Patient needs instructions on managing her insulin pump during her prep the day before her procedure and on her procedure day. She will be on clear liquids 10/21/22 all day. She will begin fasting 3 hours before her procedure time on 10/22/22  Practice name and name of physician performing surgery?      [facility]  What is your office phone and fax number?      Phone- [phone]  [phone]  Anesthesia type (None, local, MAC, general) ?       MAC

## 2022-10-02 NOTE — Telephone Encounter (Signed)
I have sent a telephone encounter to Dr Reather Littler.

## 2022-10-04 NOTE — Telephone Encounter (Signed)
Just  noticed pt called to cancel colon , no need for insulin instructions at this time Thank You.

## 2022-10-04 NOTE — Telephone Encounter (Signed)
Noted that pt cancelled colon no need for insulin pump instructions at this time.

## 2022-10-09 DIAGNOSIS — Z1231 Encounter for screening mammogram for malignant neoplasm of breast: Secondary | ICD-10-CM | POA: Diagnosis not present

## 2022-10-15 ENCOUNTER — Other Ambulatory Visit: Payer: Self-pay | Admitting: Endocrinology

## 2022-10-15 DIAGNOSIS — E7800 Pure hypercholesterolemia, unspecified: Secondary | ICD-10-CM

## 2022-10-22 ENCOUNTER — Encounter: Payer: BC Managed Care – PPO | Admitting: Internal Medicine

## 2022-10-25 ENCOUNTER — Ambulatory Visit: Payer: BC Managed Care – PPO | Admitting: Dietician

## 2022-10-30 ENCOUNTER — Telehealth: Payer: Self-pay | Admitting: Podiatry

## 2022-10-30 NOTE — Telephone Encounter (Signed)
Lmom for patient to call back regarding orthotics .    No balance

## 2022-11-06 ENCOUNTER — Encounter: Payer: Self-pay | Admitting: Family Medicine

## 2022-11-13 ENCOUNTER — Emergency Department (HOSPITAL_COMMUNITY)
Admission: EM | Admit: 2022-11-13 | Discharge: 2022-11-13 | Disposition: A | Discharge status: Home / Self Care | Payer: BC Managed Care – PPO | Attending: Emergency Medicine | Admitting: Emergency Medicine

## 2022-11-13 ENCOUNTER — Ambulatory Visit: Payer: Self-pay | Admitting: *Deleted

## 2022-11-13 ENCOUNTER — Emergency Department (HOSPITAL_BASED_OUTPATIENT_CLINIC_OR_DEPARTMENT_OTHER): Payer: BC Managed Care – PPO

## 2022-11-13 ENCOUNTER — Emergency Department (HOSPITAL_COMMUNITY): Payer: BC Managed Care – PPO

## 2022-11-13 ENCOUNTER — Encounter (HOSPITAL_COMMUNITY): Payer: Self-pay

## 2022-11-13 ENCOUNTER — Other Ambulatory Visit: Payer: Self-pay

## 2022-11-13 DIAGNOSIS — M79605 Pain in left leg: Secondary | ICD-10-CM | POA: Diagnosis not present

## 2022-11-13 DIAGNOSIS — R52 Pain, unspecified: Secondary | ICD-10-CM

## 2022-11-13 DIAGNOSIS — M545 Low back pain, unspecified: Secondary | ICD-10-CM | POA: Diagnosis not present

## 2022-11-13 DIAGNOSIS — R9431 Abnormal electrocardiogram [ECG] [EKG]: Secondary | ICD-10-CM | POA: Diagnosis not present

## 2022-11-13 DIAGNOSIS — R102 Pelvic and perineal pain: Secondary | ICD-10-CM | POA: Diagnosis not present

## 2022-11-13 DIAGNOSIS — Z043 Encounter for examination and observation following other accident: Secondary | ICD-10-CM | POA: Diagnosis not present

## 2022-11-13 DIAGNOSIS — M1612 Unilateral primary osteoarthritis, left hip: Secondary | ICD-10-CM | POA: Diagnosis not present

## 2022-11-13 LAB — CBC WITH DIFFERENTIAL/PLATELET
Abs Immature Granulocytes: 0.02 10*3/uL (ref 0.00–0.07)
Basophils Absolute: 0 10*3/uL (ref 0.0–0.1)
Basophils Relative: 0 %
Eosinophils Absolute: 0.4 10*3/uL (ref 0.0–0.5)
Eosinophils Relative: 6 %
HCT: 37 % (ref 36.0–46.0)
Hemoglobin: 12.4 g/dL (ref 12.0–15.0)
Immature Granulocytes: 0 %
Lymphocytes Relative: 28 %
Lymphs Abs: 1.9 10*3/uL (ref 0.7–4.0)
MCH: 30.8 pg (ref 26.0–34.0)
MCHC: 33.5 g/dL (ref 30.0–36.0)
MCV: 91.8 fL (ref 80.0–100.0)
Monocytes Absolute: 0.3 10*3/uL (ref 0.1–1.0)
Monocytes Relative: 4 %
Neutro Abs: 4.3 10*3/uL (ref 1.7–7.7)
Neutrophils Relative %: 62 %
Platelets: 313 10*3/uL (ref 150–400)
RBC: 4.03 MIL/uL (ref 3.87–5.11)
RDW: 12.9 % (ref 11.5–15.5)
WBC: 7 10*3/uL (ref 4.0–10.5)
nRBC: 0 % (ref 0.0–0.2)

## 2022-11-13 LAB — COMPREHENSIVE METABOLIC PANEL
ALT: 19 U/L (ref 0–44)
AST: 21 U/L (ref 15–41)
Albumin: 3.9 g/dL (ref 3.5–5.0)
Alkaline Phosphatase: 167 U/L — ABNORMAL HIGH (ref 38–126)
Anion gap: 9 (ref 5–15)
BUN: 17 mg/dL (ref 6–20)
CO2: 22 mmol/L (ref 22–32)
Calcium: 9.4 mg/dL (ref 8.9–10.3)
Chloride: 108 mmol/L (ref 98–111)
Creatinine, Ser: 0.83 mg/dL (ref 0.44–1.00)
GFR, Estimated: >60 mL/min (ref >60)
Glucose, Bld: 392 mg/dL — ABNORMAL HIGH (ref 70–99)
Potassium: 4.1 mmol/L (ref 3.5–5.1)
Sodium: 139 mmol/L (ref 135–145)
Total Bilirubin: 0.3 mg/dL (ref 0.3–1.2)
Total Protein: 7.3 g/dL (ref 6.5–8.1)

## 2022-11-13 MED ORDER — ACETAMINOPHEN 500 MG PO TABS
1000.0000 mg | ORAL_TABLET | Freq: Once | ORAL | Status: AC
  Administered 2022-11-13: 1000 mg via ORAL
  Filled 2022-11-13: qty 2

## 2022-11-13 MED ORDER — KETOROLAC TROMETHAMINE 15 MG/ML IJ SOLN
15.0000 mg | Freq: Once | INTRAMUSCULAR | Status: AC
  Administered 2022-11-13: 15 mg via INTRAMUSCULAR
  Filled 2022-11-13: qty 1

## 2022-11-13 NOTE — ED Provider Triage Note (Signed)
Emergency Medicine Provider Triage Evaluation Note  Gabrielle Waller , a 57 y.o. female  was evaluated in triage.  Pt complains of left leg pain.  Patient reports that she recently had a trip to [place] and pain was supposedly present prior to this trip but was worse in while she was out of the country.  She reports that the pain is in her left lower back with some radiation into the anterior aspect of the left thigh.  Patient does also report some tenderness throughout her left leg.  Patient denies any shortness of breath, chest pain, nausea, vomiting.  Review of Systems  Positive: As above Negative: As above  Physical Exam  BP (!) 169/74 (BP Location: Left Arm)   Pulse 88   Temp 98.3 F (36.8 C) (Oral)   Resp 19   SpO2 100%  Gen:   Awake, no distress Resp:  Normal effort  MSK:   Moves extremities without difficulty, some weakness in left leg compared to right Other:  Tenderness to palpation and left calf, left popliteal, and left thigh  Medical Decision Making  Medically screening exam initiated at 4:36 PM.  Appropriate orders placed.  Gabrielle Waller was informed that the remainder of the evaluation will be completed by another provider, this initial triage assessment does not replace that evaluation, and the importance of remaining in the ED until their evaluation is complete.     Luvenia Heller, PA-C 11/13/22 1639

## 2022-11-13 NOTE — Progress Notes (Signed)
Lower extremity venous has been completed.   Preliminary results in CV Proc.   Gabrielle Waller Gabrielle Waller 11/13/2022 5:51 PM

## 2022-11-13 NOTE — ED Triage Notes (Signed)
Patient has had upper left leg pain for 3 weeks. Using a cane for ambulation. No falls.

## 2022-11-13 NOTE — ED Provider Notes (Signed)
[facility] DEPT Provider Note   CSN: [id number] Arrival date & time: 11/13/22  1621     History Chief Complaint  Patient presents with   Leg Pain    HPI Gabrielle Waller is a 57 y.o. female presenting for chief complaint of left leg thigh pain.  She states that she has fallen twice in the last month.  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  Denies fevers or chills, nausea vomiting, syncope or shortness of breath.  Otherwise ambulatory tolerating p.o. intake.  Pain localizes to lateral left thigh..   Patient's recorded medical, surgical, social, medication list and allergies were reviewed in the Snapshot window as part of the initial history.   Review of Systems   Review of Systems  Constitutional:  Negative for chills and fever.  HENT:  Negative for ear pain and sore throat.   Eyes:  Negative for pain and visual disturbance.  Respiratory:  Negative for cough and shortness of breath.   Cardiovascular:  Negative for chest pain and palpitations.  Gastrointestinal:  Negative for abdominal pain and vomiting.  Genitourinary:  Negative for dysuria and hematuria.  Musculoskeletal:  Negative for arthralgias and back pain.  Skin:  Negative for color change and rash.  Neurological:  Negative for seizures and syncope.  All other systems reviewed and are negative.   Physical Exam Updated Vital Signs BP (!) 129/97 (BP Location: Right Arm)   Pulse 82   Temp 99 F (37.2 C) (Axillary)   Resp 16   Ht 5' (1.524 m)   Wt 51.3 kg   SpO2 100%   BMI 22.07 kg/m  Physical Exam Vitals and nursing note reviewed.  Constitutional:      General: She is not in acute distress.    Appearance: She is well-developed.  HENT:     Head: Normocephalic and atraumatic.  Eyes:     Conjunctiva/sclera: Conjunctivae normal.  Cardiovascular:     Rate and Rhythm: Normal rate and regular rhythm.     Heart sounds: No murmur  heard. Pulmonary:     Effort: Pulmonary effort is normal. No respiratory distress.     Breath sounds: Normal breath sounds.  Abdominal:     General: There is no distension.     Palpations: Abdomen is soft.     Tenderness: There is no abdominal tenderness. There is no right CVA tenderness or left CVA tenderness.  Musculoskeletal:        General: No swelling, tenderness, deformity or signs of injury. Normal range of motion.     Cervical back: Neck supple.  Skin:    General: Skin is warm and dry.  Neurological:     General: No focal deficit present.     Mental Status: She is alert and oriented to person, place, and time. Mental status is at baseline.     Cranial Nerves: No cranial nerve deficit.     Motor: No weakness.      ED Course/ Medical Decision Making/ A&P Clinical Course as of 11/13/22 2259  Tue Nov 13, 2022  2106 Fall 2x past week with left upper leg pain [CC]    Clinical Course User Index [CC] Tretha Sciara, MD    Procedures Procedures   Medications Ordered in ED Medications  ketorolac (TORADOL) 15 MG/ML injection 15 mg (15 mg Intramuscular Given 11/13/22 2156)  acetaminophen (TYLENOL) tablet 1,000 mg (1,000 mg Oral Given 11/13/22 2156)    Medical Decision Making:  Gabrielle Waller is a 57 y.o. female who presented to the ED today with left leg pain detailed above.     Additional history discussed with patient's family/caregivers.  Patient's presentation is complicated by their history of chronic pain.  Patient placed on continuous vitals and telemetry monitoring while in ED which was reviewed periodically.   Complete initial physical exam performed, notably the patient  was hemodynamically stable no acute distress.  No tenderness to palpation along the left leg if she endorses pain with muscle motion..      Reviewed and confirmed nursing documentation for past medical history, family history, social history.    Initial Assessment:   With the patient's  presentation of left leg pain, most likely diagnosis is musculoskeletal etiology especially given recent falls.. Other diagnoses were considered including (but not limited to) septic arthritis however the pain is more muscular, hematoma, traumatic injury including fracture. These are considered less likely due to history of present illness and physical exam findings.   This is most consistent with an acute life/limb threatening illness complicated by underlying chronic conditions.  Initial Plan:  Given substantial nature pain, will evaluate for DVT with left lower extremity ultrasound Will evaluate for traumatic injury with femur x-ray, pelvis x-ray. Triage evaluated patient spine with lumbar x-ray which was ultimately reassuring.  Patient has no tenderness to palpation in her midline back Screening labs including CBC and Metabolic panel to evaluate for infectious or metabolic etiology of disease.  Objective evaluation as below reviewed with plan for close reassessment  Initial Study Results:   Laboratory  All laboratory results reviewed without evidence of clinically relevant pathology.    EKG EKG was reviewed independently. Rate, rhythm, axis, intervals all examined and without medically relevant abnormality. ST segments without concerns for elevations.    Radiology  All images reviewed independently. Agree with radiology report at this time.   DG FEMUR MIN 2 VIEWS LEFT  Result Date: 11/13/2022 CLINICAL DATA:  Fall, left leg pain. EXAM: LEFT FEMUR 2 VIEWS COMPARISON:  None Available. FINDINGS: There is no evidence of fracture or other focal bone lesions. Soft tissues are unremarkable. IMPRESSION: Negative. Electronically Signed   By: Keane Police D.O.   On: 11/13/2022 21:55   DG Hip Unilat W or Wo Pelvis 2-3 Views Left  Result Date: 11/13/2022 CLINICAL DATA:  Fall, no injury left hip pain. EXAM: DG HIP (WITH OR WITHOUT PELVIS) 2-3V LEFT COMPARISON:  None Available. FINDINGS: There is no  evidence of hip fracture or dislocation. There is mild superolateral hip joint space narrowing with marginal spurring. Mild degenerative changes of the pubic symphysis. IMPRESSION: 1. No fracture or dislocation. 2. 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 Electronically Signed   By: Keane Police D.O.   On: 11/13/2022 21:54   VAS [place] LOWER EXTREMITY VENOUS (DVT) (7a-7p)  Result Date: 11/13/2022  Lower Venous DVT Study Patient Name:  Gabrielle Waller  Date of Exam:   11/13/2022 Medical Rec #: [id number]   Accession #:    [id number] Date of Birth: 10-03-1966   Patient Gender: F Patient Age:   78 years Exam Location:  [facility] Procedure:      VAS [place] LOWER EXTREMITY VENOUS (DVT) Referring Phys: Lourdes Sledge --------------------------------------------------------------------------------  Indications: Pain.  Comparison Study: no prior Performing Technologist: Archie Patten RVS  Examination Guidelines: A complete evaluation includes B-mode imaging, spectral Doppler, color Doppler, and power Doppler as needed of all accessible portions of each vessel. Bilateral testing is considered an  integral part of a complete examination. Limited examinations for reoccurring indications may be performed as noted. The reflux portion of the exam is performed with the patient in reverse Trendelenburg.  +-----+---------------+---------+-----------+----------+--------------+ RIGHTCompressibilityPhasicitySpontaneityPropertiesThrombus Aging +-----+---------------+---------+-----------+----------+--------------+ CFV  Full           Yes      Yes                                 +-----+---------------+---------+-----------+----------+--------------+   +---------+---------------+---------+-----------+----------+--------------+ LEFT     CompressibilityPhasicitySpontaneityPropertiesThrombus Aging +---------+---------------+---------+-----------+----------+--------------+ CFV      Full           Yes      Yes                                  +---------+---------------+---------+-----------+----------+--------------+ SFJ      Full                                                        +---------+---------------+---------+-----------+----------+--------------+ FV Prox  Full                                                        +---------+---------------+---------+-----------+----------+--------------+ FV Mid   Full                                                        +---------+---------------+---------+-----------+----------+--------------+ FV DistalFull                                                        +---------+---------------+---------+-----------+----------+--------------+ PFV      Full                                                        +---------+---------------+---------+-----------+----------+--------------+ POP      Full           Yes      Yes                                 +---------+---------------+---------+-----------+----------+--------------+ PTV      Full                                                        +---------+---------------+---------+-----------+----------+--------------+ PERO     Full                                                        +---------+---------------+---------+-----------+----------+--------------+  Summary: RIGHT: - No evidence of common femoral vein obstruction.  LEFT: - There is no evidence of deep vein thrombosis in the lower extremity.  - No cystic structure found in the popliteal fossa.  *See table(s) above for measurements and observations.    Preliminary    DG Pelvis 1-2 Views  Result Date: 11/13/2022 CLINICAL DATA:  Pain EXAM: PELVIS - 1 VIEW COMPARISON:  None Available. FINDINGS: There is no evidence of pelvic fracture or diastasis. No pelvic bone lesions are seen. IMPRESSION: Negative. Electronically Signed   By: Layla Maw M.D.   On: 11/13/2022 17:28   DG Lumbar Spine Complete  Result Date:  11/13/2022 CLINICAL DATA:  Back pain EXAM: LUMBAR SPINE - COMPLETE 5 VIEW COMPARISON:  None Available. FINDINGS: Osseous structures are osteopenic. There is no evidence of lumbar spine fracture. Alignment is normal. Intervertebral disc spaces are maintained. IMPRESSION: Osteopenia.  No acute osseous abnormalities. Electronically Signed   By: Layla Maw M.D.   On: 11/13/2022 17:28     Final Assessment and Plan:   Reassessed at bedside after treatment of her musculoskeletal pain with NSAIDs and Tylenol.  She has had complete symptomatic resolution and feels comfortable walking.  I favor this is likely nonspecific musculoskeletal etiology.  No evidence of fracture, DVT or any other acute pathology.  Recommend continuation of Tylenol and follow-up with PCP and orthopedics in the outpatient setting as needed.   Disposition:  I have considered need for hospitalization, however, considering all of the above, I believe this patient is stable for discharge at this time.  Patient/family educated about specific return precautions for given chief complaint and symptoms.  Patient/family educated about follow-up with PCP/Ortho   Patient/family expressed understanding of return precautions and need for follow-up. Patient spoken to regarding all imaging and laboratory results and appropriate follow up for these results. All education provided in verbal form with additional information in written form. Time was allowed for answering of patient questions. Patient discharged.    Emergency Department Medication Summary:   Medications  ketorolac (TORADOL) 15 MG/ML injection 15 mg (15 mg Intramuscular Given 11/13/22 2156)  acetaminophen (TYLENOL) tablet 1,000 mg (1,000 mg Oral Given 11/13/22 2156)         Clinical Impression:  1. Left leg pain      Discharge   Final Clinical Impression(s) / ED Diagnoses Final diagnoses:  Left leg pain    Rx / DC Orders ED Discharge Orders     None          Glyn Ade, MD 11/13/22 2259

## 2022-11-13 NOTE — Telephone Encounter (Signed)
Interpreter:Mariana [id number]  Chief Complaint: swelling/pain in  left leg Symptoms: swelling/pain left leg, SOB with walking Frequency: 2 weeks- recent travel Pertinent Negatives: Patient denies fever Disposition: [x] ED /[] Urgent Care (no appt availability in office) / [] Appointment(In office/virtual)/ []  [facility]/ [] Home Care/ [] Refused Recommended Disposition /[] [facility]/ []  Follow-up with PCP Additional Notes: Patient advised ED- wants to keed appointment tomorrow for follow up- - patient will go to ED today  Reason for Disposition  [1] Difficulty breathing with exertion (e.g., walking) AND [2] new-onset or worsening  Answer Assessment - Initial Assessment Questions 1. ONSET: "When did the swelling start?" (e.g., minutes, hours, days)     2 weeks 2. LOCATION: "What part of the leg is swollen?"  "Are both legs swollen or just one leg?"     Left leg, whole leg 3. SEVERITY: "How bad is the swelling?" (e.g., localized; mild, moderate, severe)   - Localized: Small area of swelling localized to one leg.   - MILD pedal edema: Swelling limited to foot and ankle, pitting edema < 1/4 inch (6 mm) deep, rest and elevation eliminate most or all swelling.   - MODERATE edema: Swelling of lower leg to knee, pitting edema > 1/4 inch (6 mm) deep, rest and elevation only partially reduce swelling.   - SEVERE edema: Swelling extends above knee, facial or hand swelling present.      Moderate/severe 4. REDNESS: "Does the swelling look red or infected?"     no 5. PAIN: "Is the swelling painful to touch?" If Yes, ask: "How painful is it?"   (Scale 1-10; mild, moderate or severe)     Yes- hurts too much- hard to walk 6. FEVER: "Do you have a fever?" If Yes, ask: "What is it, how was it measured, and when did it start?"      no 7. CAUSE: "What do you think is causing the leg swelling?"     Occurring during travel 8. MEDICAL HISTORY: "Do you have a history of blood clots (e.g.,  DVT), cancer, heart failure, kidney disease, or liver failure?"     Elevated BP 9. RECURRENT SYMPTOM: "Have you had leg swelling before?" If Yes, ask: "When was the last time?" "What happened that time?"     no 10. OTHER SYMPTOMS: "Do you have any other symptoms?" (e.g., chest pain, difficulty breathing)       SOB  Protocols used: Leg Swelling and Edema-A-AH

## 2022-11-14 ENCOUNTER — Ambulatory Visit: Payer: BC Managed Care – PPO | Attending: Physician Assistant | Admitting: Physician Assistant

## 2022-11-14 VITALS — BP 132/84 | HR 81 | Temp 98.2°F | Ht 60.0 in | Wt 113.0 lb

## 2022-11-14 DIAGNOSIS — E1042 Type 1 diabetes mellitus with diabetic polyneuropathy: Secondary | ICD-10-CM | POA: Diagnosis not present

## 2022-11-14 DIAGNOSIS — E7800 Pure hypercholesterolemia, unspecified: Secondary | ICD-10-CM

## 2022-11-14 DIAGNOSIS — M79605 Pain in left leg: Secondary | ICD-10-CM | POA: Diagnosis not present

## 2022-11-14 DIAGNOSIS — E1069 Type 1 diabetes mellitus with other specified complication: Secondary | ICD-10-CM | POA: Diagnosis not present

## 2022-11-14 DIAGNOSIS — Z09 Encounter for follow-up examination after completed treatment for conditions other than malignant neoplasm: Secondary | ICD-10-CM

## 2022-11-14 DIAGNOSIS — E1065 Type 1 diabetes mellitus with hyperglycemia: Secondary | ICD-10-CM

## 2022-11-14 DIAGNOSIS — Z789 Other specified health status: Secondary | ICD-10-CM

## 2022-11-14 LAB — POCT GLYCOSYLATED HEMOGLOBIN (HGB A1C): HbA1c, POC (controlled diabetic range): 8 % — AB (ref 0.0–7.0)

## 2022-11-14 LAB — GLUCOSE, POCT (MANUAL RESULT ENTRY): POC Glucose: 291 mg/dl — AB (ref 70–99)

## 2022-11-14 MED ORDER — TELMISARTAN 20 MG PO TABS: 20.0000 mg | ORAL_TABLET | Freq: Every day | ORAL | 2 refills | Status: DC

## 2022-11-14 MED ORDER — GABAPENTIN 300 MG PO CAPS: 300.0000 mg | ORAL_CAPSULE | Freq: Every day | ORAL | 1 refills | Status: DC

## 2022-11-14 MED ORDER — ROSUVASTATIN CALCIUM 40 MG PO TABS: 40.0000 mg | ORAL_TABLET | Freq: Every day | ORAL | 1 refills | Status: DC

## 2022-11-14 MED ORDER — TOUJEO SOLOSTAR 300 UNIT/ML ~~LOC~~ SOPN: PEN_INJECTOR | SUBCUTANEOUS | 3 refills | Status: DC

## 2022-11-14 MED ORDER — INSULIN REGULAR HUMAN 100 UNIT/ML IJ SOLN: INTRAMUSCULAR | 3 refills | Status: DC

## 2022-11-14 NOTE — Progress Notes (Signed)
Patient ID: Gabrielle Waller, female   DOB: April 09, 1966, 57 y.o.   MRN: [id number]    Gabrielle Waller, is a 57 y.o. female  TIW:[id number]  SNK:[id number]  DOB - 05/07/1966  Chief Complaint  Patient presents with   Diabetes    Pain on leg X3 weeks. Had a fall in [place] 2 weeks ago. Already received flu vax this season at [facility]. (Unsure of vax date)       Subjective:   Gabrielle Waller is a 57 y.o. female here today for a follow up visit Seen at [facility] 11/13/2022 for L leg pain.  Pain is better on tylenol.  She is compliant with meds.  Needs RF.    From ED 11/13/2022:  HPI Gabrielle Waller is a 57 y.o. female presenting for chief complaint of left leg thigh pain.  She states that she has fallen twice in the last month.  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  Denies fevers or chills, nausea vomiting, syncope or shortness of breath.  Otherwise ambulatory tolerating p.o. intake.  Pain localizes to lateral left thigh..   Initial Assessment:   With the patient's presentation of left leg pain, most likely diagnosis is musculoskeletal etiology especially given recent falls.. Other diagnoses were considered including (but not limited to) septic arthritis however the pain is more muscular, hematoma, traumatic injury including fracture. These are considered less likely due to history of present illness and physical exam findings.   This is most consistent with an acute life/limb threatening illness complicated by underlying chronic conditions.   Initial Plan:  Given substantial nature pain, will evaluate for DVT with left lower extremity ultrasound Will evaluate for traumatic injury with femur x-ray, pelvis x-ray. Triage evaluated patient spine with lumbar x-ray which was ultimately reassuring.  Patient has no tenderness to palpation in her midline back Screening labs including CBC and Metabolic panel to evaluate for infectious or metabolic etiology of disease.  Objective  evaluation as below reviewed with plan for close reassessment  Final Assessment and Plan:   Reassessed at bedside after treatment of her musculoskeletal pain with NSAIDs and Tylenol.  She has had complete symptomatic resolution and feels comfortable walking.  I favor this is likely nonspecific musculoskeletal etiology.  No evidence of fracture, DVT or any other acute pathology.  Recommend continuation of Tylenol and follow-up with PCP and orthopedics in the outpatient setting as needed. No problems updated.  ALLERGIES: Allergies  Allergen Reactions   Penicillins Itching    PAST MEDICAL HISTORY: Past Medical History:  Diagnosis Date   Diabetes mellitus without complication ([place])    Kidney stones     MEDICATIONS AT HOME: Prior to Admission medications   Medication Sig Start Date End Date Taking? Authorizing Provider  acetaminophen (TYLENOL) 325 MG tablet Take 650 mg by mouth every 6 (six) hours as needed.   Yes [provider]  aspirin 325 MG EC tablet Take 325 mg by mouth daily.   Yes [provider]  cetirizine (ZYRTEC) 10 MG tablet Take 1 tablet (10 mg total) by mouth daily. 07/19/22  Yes Charlott Rakes, MD  Continuous Blood Gluc Sensor (FREESTYLE LIBRE 3 SENSOR) MISC 1 Device by Does not apply route every 14 (fourteen) days. Apply 1 sensor on upper arm every 14 days for continuous glucose monitoring 11/27/21  Yes Elayne Snare, MD  famotidine (PEPCID) 20 MG tablet Take 1 tablet (20 mg total) by mouth 2 (two) times daily. 10/03/20  Yes  Robyn Haber, MD  fluticasone [facility]) 50 MCG/ACT nasal spray Place 2 sprays into both nostrils daily. 07/19/22  Yes Charlott Rakes, MD  Glucagon (BAQSIMI ONE PACK) 3 MG/DOSE POWD Use in nostril for sever low sugar 05/22/21  Yes Elayne Snare, MD  Insulin Disposable Pump (OMNIPOD 5 G6 POD, GEN 5,) MISC 1 Device by Does not apply route every 3 (three) days. 09/03/22  Yes Elayne Snare, MD  Insulin Syringe-Needle U-100 (INSULIN SYRINGE  .5CC/31GX5/16") 31G X 5/16" 0.5 ML MISC Use to inject insulin 08/21/21  Yes Elayne Snare, MD  loratadine (CLARITIN) 10 MG tablet Take 1 tablet (10 mg total) by mouth daily. 03/08/16  Yes Golden Circle, FNP  meloxicam (MOBIC) 7.5 MG tablet Take 1 tablet (7.5 mg total) by mouth daily as needed for pain. 06/05/21  Yes Trula Slade, DPM  OneTouch Delica Lancets 83M MISC 1 each by Does not apply route in the morning, at noon, and at bedtime. Use onetouch delica lancets to check blood sugar 3 times daily. 02/04/20  Yes Elayne Snare, MD  [facility] VERIO test strip USE 1 STRIP TO CHECK GLUCOSE THREE TIMES DAILY 10/01/22  Yes Elayne Snare, MD  Vitamin D, Ergocalciferol, (DRISDOL) 50000 units CAPS capsule Take 50,000 Units by mouth every 7 (seven) days.   Yes [provider]  gabapentin (NEURONTIN) 300 MG capsule Take 1 capsule (300 mg total) by mouth at bedtime. 11/14/22   Argentina Donovan, PA-C  insulin glargine, 1 Unit Dial, (TOUJEO SOLOSTAR) 300 UNIT/ML Solostar Pen 26 units in the morning and 20 units at dinnertime 11/14/22   Madelon Welsch, Levada Dy M, PA-C  insulin regular (NOVOLIN R RELION) 100 units/mL injection INJECT 25 UNITS SUBCUTANEOUSLY THREE TIMES DAILY BEFORE MEAL(S) 11/14/22   Argentina Donovan, PA-C  rosuvastatin (CRESTOR) 40 MG tablet Take 1 tablet (40 mg total) by mouth daily. 11/14/22   Argentina Donovan, PA-C  telmisartan (MICARDIS) 20 MG tablet Take 1 tablet (20 mg total) by mouth daily. 11/14/22   Chauntel Windsor, Dionne Bucy, PA-C    ROS: Neg HEENT Neg resp Neg cardiac Neg GI Neg GU Neg psych Neg neuro  Objective:   Vitals:   11/14/22 0932  BP: 132/84  Pulse: 81  Temp: 98.2 F (36.8 C)  TempSrc: Oral  SpO2: 99%  Weight: 113 lb (51.3 kg)  Height: 5' (1.524 m)   Exam General appearance : Awake, alert, not in any distress. Speech Clear. Not toxic looking HEENT: Atraumatic and Normocephalic Neck: Supple, no JVD. No cervical lymphadenopathy.  Chest: Good air entry bilaterally,  CTAB.  No rales/rhonchi/wheezing CVS: S1 S2 regular, no murmurs.  Extremities: B/L Lower Ext shows no edema, both legs are warm to touch Neurology: Awake alert, and oriented X 3, CN II-XII intact, Non focal Skin: No Rash  Data Review Lab Results  Component Value Date   HGBA1C 8.0 (A) 11/14/2022   HGBA1C 8.7 (H) 08/27/2022   HGBA1C 8.7 (H) 05/28/2022    Assessment & Plan   1. Type 1 diabetes mellitus with other specified complication (HCC) Hemodynamically stable with yesterday's labs - Glucose (CBG) - HgB A1c  2. Hypercholesterolemia - rosuvastatin (CRESTOR) 40 MG tablet; Take 1 tablet (40 mg total) by mouth daily.  Dispense: 90 tablet; Refill: 1  3. 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  Dispense: 90 capsule; Refill: 1  4. Left leg pain Better with tylenol  5.  Encounter for examination following treatment at hospital Doing well  6. Language barrier [employer] "Luis" interpreters used and additional time performing visit was required.   7. Uncontrolled type 1 diabetes mellitus with hyperglycemia (HCC) Improved over last A1c 8.7 in October.  Followed by endocrine - insulin glargine, 1 Unit Dial, (TOUJEO SOLOSTAR) 300 UNIT/ML Solostar Pen; 26 units in the morning and 20 units at dinnertime  Dispense: 4.5 mL; Refill: 3    Return in about 4 months (around 03/15/2023) for PCP for chronic conditions.  The patient was given clear instructions to go to ER or return to medical center if symptoms don't improve, worsen or new problems develop. The patient verbalized understanding. The patient was told to call to get lab results if they haven't heard anything in the next week.      Georgian Co, PA-C [facility], [place] [phone]   11/14/2022, 9:51 AM

## 2022-11-22 ENCOUNTER — Other Ambulatory Visit: Payer: Self-pay | Admitting: Family Medicine

## 2022-11-22 DIAGNOSIS — E1065 Type 1 diabetes mellitus with hyperglycemia: Secondary | ICD-10-CM

## 2022-11-22 NOTE — Telephone Encounter (Signed)
Requested Prescriptions  Pending Prescriptions Disp Refills   insulin glargine, 1 Unit Dial, (TOUJEO SOLOSTAR) 300 UNIT/ML Solostar Pen 4.5 mL 3    Sig: 26 units in the morning and 20 units at dinnertime     Endocrinology:  Diabetes - Insulins Failed - 11/22/2022 12:56 PM      Failed - HBA1C is between 0 and 7.9 and within 180 days    HbA1c, POC (controlled diabetic range)  Date Value Ref Range Status  11/14/2022 8.0 (A) 0.0 - 7.0 % Final         Passed - Valid encounter within last 6 months    Recent Outpatient Visits           1 week ago Type 1 diabetes mellitus with other specified complication [facility])   [place], [place], [place]   4 months ago Screening for viral disease   [place], Charlane Ferretti, MD       Future Appointments             In 1 month Elayne Snare, MD [facility]   In 1 month Charlott Rakes, MD [place]   In 3 months Charlott Rakes, MD [place]

## 2022-11-22 NOTE — Telephone Encounter (Signed)
Medication Refill - Medication: insulin glargine, 1 Unit Dial, (TOUJEO SOLOSTAR) 300 UNIT/ML Solostar Pen [[id number]]   Has the patient contacted their pharmacy? Yes.   (Agent: If no, request that the patient contact the pharmacy for the refill. If patient does not wish to contact the pharmacy document the reason why and proceed with request.) (Agent: If yes, when and what did the pharmacy advise?)  Preferred Pharmacy (with phone number or street name):  [facility] [phone] - [address] AT [address] [place] Phone: [phone]  Fax: [phone]     Has the patient been seen for an appointment in the last year OR does the patient have an upcoming appointment? Yes.    Agent: Please be advised that RX refills may take up to 3 business days. We ask that you follow-up with your pharmacy.

## 2022-11-26 ENCOUNTER — Other Ambulatory Visit: Payer: Self-pay

## 2022-11-26 DIAGNOSIS — E1065 Type 1 diabetes mellitus with hyperglycemia: Secondary | ICD-10-CM

## 2022-11-26 MED ORDER — TOUJEO SOLOSTAR 300 UNIT/ML ~~LOC~~ SOPN: PEN_INJECTOR | SUBCUTANEOUS | 3 refills | Status: DC

## 2022-12-15 ENCOUNTER — Other Ambulatory Visit: Payer: Self-pay | Admitting: Endocrinology

## 2022-12-24 DIAGNOSIS — M25552 Pain in left hip: Secondary | ICD-10-CM | POA: Diagnosis not present

## 2022-12-24 DIAGNOSIS — M545 Low back pain, unspecified: Secondary | ICD-10-CM | POA: Diagnosis not present

## 2022-12-31 ENCOUNTER — Encounter: Payer: Self-pay | Admitting: Endocrinology

## 2022-12-31 ENCOUNTER — Encounter: Payer: BC Managed Care – PPO | Admitting: Endocrinology

## 2023-01-01 ENCOUNTER — Ambulatory Visit (INDEPENDENT_AMBULATORY_CARE_PROVIDER_SITE_OTHER): Payer: BC Managed Care – PPO | Admitting: Endocrinology

## 2023-01-01 ENCOUNTER — Encounter: Payer: Self-pay | Admitting: Endocrinology

## 2023-01-01 VITALS — BP 130/80 | HR 100 | Ht 60.0 in | Wt 113.6 lb

## 2023-01-01 DIAGNOSIS — E1065 Type 1 diabetes mellitus with hyperglycemia: Secondary | ICD-10-CM

## 2023-01-01 DIAGNOSIS — E7800 Pure hypercholesterolemia, unspecified: Secondary | ICD-10-CM

## 2023-01-01 MED ORDER — FREESTYLE LIBRE 3 SENSOR MISC: 1.0000 | 2 refills | Status: DC

## 2023-01-01 MED ORDER — TRESIBA FLEXTOUCH 200 UNIT/ML ~~LOC~~ SOPN: 50.0000 [IU] | PEN_INJECTOR | Freq: Every day | SUBCUTANEOUS | 1 refills | Status: DC

## 2023-01-01 NOTE — Progress Notes (Signed)
This encounter was created in error - please disregard.

## 2023-01-01 NOTE — Patient Instructions (Signed)
Toujeo 30 in am and 26 at dinner  When finished then change to 50 Tresiba only in am, call if high in am

## 2023-01-01 NOTE — Progress Notes (Signed)
Patient ID: Gabrielle Waller, female   DOB: 05-Aug-1966, 57 y.o.   MRN: [id number]   Reason for Appointment : Follow up for Type 1 Diabetes  History of Present Illness          Diagnosis: Type 1 diabetes mellitus, date of diagnosis: 2001         Past history: Her blood sugar at diagnosis was 1056 and was started on insulin She is typically having difficulty controlling her diabetes because of cost of her medications and compliance She is also having relatively labile blood sugars Has difficulty understanding instructions for day-to-day management for diabetes and although she claims to be compliant with her insulin doses as prescribed she does not often check her blood sugars as directed and not clear how her diet affects her blood sugars   Recent history:   INSULIN regimen is described as: Toujeo 28 units am, 20 units in the evening  regular insulin mostly 25--20-20 units at meals    A1c is 8.7 % again, Usually over 8%   Current problems and glucose  patterns:  She still has not been able to get the freestyle libre and not clear why she did not pick up the prescription, she is still somewhat unclear of the reason Her blood sugars are highly variable as before She appears to have much higher readings fasting most of the time although on occasion may be better and recently they are significantly high also Yesterday her blood sugar was 411 at dinnertime even though she thinks she skipped her lunch She does not think she forgets any of the insulin doses Recently not eating fast food and generally diet appears to be small and portions and fat intake She adjusts her Premeal insulin possibly based on her blood sugar reading rather than her meal size Also not doing many readings after dinner Hypoglycemia was present mostly 3 to 4 weeks ago and less recently    Self-care:  Meals: 2-3 meals per day.  Breakfast 9-10 am; lunch  12pm and dinner 6-7 p.m.    Glucose monitoring:  done  1-2  times a day         Glucometer: One Touch   Blood Glucose readings and averages from review of monitor as below    Currently has 56% of readings above target and 5% below target  PRE-MEAL Fasting Lunch Dinner Bedtime Overall  Glucose range: 87-399  119-457    Mean/median:     216   POST-MEAL PC Breakfast PC Lunch PC Dinner  Glucose range: 227-457  35-318  Mean/median:      Previously:  PRE-MEAL Fasting Lunch Dinner Bedtime Overall  Glucose range: 93-411  64-297 35-240   Mean/median: 241  200  197   POST-MEAL PC Breakfast PC Lunch PC Dinner  Glucose range:   89-301  Mean/median:   160     Dietician visit: Most recent:. ?     She has seen diabetes educator several times in the past      Wt Readings from Last 3 Encounters:  01/01/23 113 lb 9.6 oz (51.5 kg)  12/31/22 114 lb (51.7 kg)  11/14/22 113 lb (51.3 kg)     Lab Results  Component Value Date   HGBA1C 8.0 (A) 11/14/2022   HGBA1C 8.7 (H) 08/27/2022   HGBA1C 8.7 (H) 05/28/2022   Lab Results  Component Value Date   MICROALBUR 7.5 (H) 11/20/2021   LDLCALC 96 11/20/2021   CREATININE 0.83 11/13/2022  Lab Results  Component Value Date   MICRALBCREAT 7.6 11/20/2021   MICRALBCREAT 12.6 01/13/2021     Allergies as of 01/01/2023       Reactions   Penicillins Itching        Medication List        Accurate as of January 01, 2023  4:55 PM. If you have any questions, ask your nurse or doctor.          STOP taking these medications    Toujeo SoloStar 300 UNIT/ML Solostar Pen Generic drug: insulin glargine (1 Unit Dial) Stopped by: Elayne Snare, MD       TAKE these medications    acetaminophen 325 MG tablet Commonly known as: TYLENOL Take 650 mg by mouth every 6 (six) hours as needed.   aspirin EC 325 MG tablet Take 325 mg by mouth daily.   Baqsimi One Pack 3 MG/DOSE Powd Generic drug: Glucagon Use in nostril for sever low sugar   cetirizine 10 MG tablet Commonly known as:  ZYRTEC Take 1 tablet (10 mg total) by mouth daily.   famotidine 20 MG tablet Commonly known as: PEPCID Take 1 tablet (20 mg total) by mouth 2 (two) times daily.   fluticasone 50 MCG/ACT nasal spray Commonly known as: FLONASE Place 2 sprays into both nostrils daily.   FreeStyle Libre 3 Sensor Misc 1 Device by Does not apply route every 14 (fourteen) days. Apply 1 sensor on upper arm every 14 days for continuous glucose monitoring   gabapentin 300 MG capsule Commonly known as: NEURONTIN Take 1 capsule (300 mg total) by mouth at bedtime.   insulin regular 100 units/mL injection Commonly known as: NovoLIN R ReliOn INJECT 25 UNITS SUBCUTANEOUSLY THREE TIMES DAILY BEFORE MEAL(S)   INSULIN SYRINGE .5CC/31GX5/16" 31G X 5/16" 0.5 ML Misc Use to inject insulin   loratadine 10 MG tablet Commonly known as: CLARITIN Take 1 tablet (10 mg total) by mouth daily.   meloxicam 7.5 MG tablet Commonly known as: Mobic Take 1 tablet (7.5 mg total) by mouth daily as needed for pain.   Omnipod 5 G6 Pods (Gen 5) Misc 1 Device by Does not apply route every 3 (three) days.   OneTouch Delica Lancets 99991111 Misc 1 each by Does not apply route in the morning, at noon, and at bedtime. Use onetouch delica lancets to check blood sugar 3 times daily.   OneTouch Verio test strip Generic drug: glucose blood USE 1 STRIP TO CHECK GLUCOSE THREE TIMES DAILY   rosuvastatin 40 MG tablet Commonly known as: CRESTOR Take 1 tablet (40 mg total) by mouth daily.   telmisartan 20 MG tablet Commonly known as: MICARDIS Take 1 tablet (20 mg total) by mouth daily.   Tyler Aas FlexTouch 200 UNIT/ML FlexTouch Pen Generic drug: insulin degludec Inject 50 Units into the skin daily. Started by: Elayne Snare, MD   Vitamin D (Ergocalciferol) 1.25 MG (50000 UNIT) Caps capsule Commonly known as: DRISDOL Take 50,000 Units by mouth every 7 (seven) days.        Allergies:  Allergies  Allergen Reactions   Penicillins  Itching    Past Medical History:  Diagnosis Date   Diabetes mellitus without complication ([place])    Kidney stones     Past Surgical History:  Procedure Laterality Date   APPENDECTOMY      Family History  Problem Relation Age of Onset   Diabetes Mother     Social History:  reports that she has never smoked. She has never  used smokeless tobacco. She reports that she does not drink alcohol and does not use drugs.    Review of Systems:   She has a prior history of hyperthyroidism treated with Tapazole in 2001.   TSH level usually is slightly low without increased T4 or T3.   Lab Results  Component Value Date   FREET4 0.81 11/02/2019   FREET4 0.70 01/26/2019   FREET4 0.69 11/09/2016   TSH 0.28 (L) 05/15/2021   TSH 0.33 (L) 11/02/2019   TSH 0.33 (L) 01/26/2019    She has had significant hyperlipidemia at baseline including high triglycerides. she was taking Lipitor 80 mg Has had inconsistent control of LDL  Last LDL is below 100 after switching to Crestor 40 from Lipitor   Lab Results  Component Value Date   CHOL 185 11/20/2021   CHOL 192 01/26/2019   CHOL 155 07/23/2018   Lab Results  Component Value Date   HDL 52.20 11/20/2021   HDL 48.10 01/26/2019   HDL 41.30 07/23/2018   Lab Results  Component Value Date   LDLCALC 96 11/20/2021   LDLCALC 120 (H) 01/26/2019   LDLCALC 93 07/23/2018   Lab Results  Component Value Date   TRIG 185.0 (H) 11/20/2021   TRIG 121.0 01/26/2019   TRIG 104.0 07/23/2018   Lab Results  Component Value Date   CHOLHDL 4 11/20/2021   CHOLHDL 4 01/26/2019   CHOLHDL 4 07/23/2018   Lab Results  Component Value Date   LDLDIRECT 98.0 05/28/2022   LDLDIRECT 93.0 05/15/2021   LDLDIRECT 105.0 07/08/2020    No history of numbness in the feet but has some pains   last exam was done at [facility] in 5/22 with background retinopathy  HYPERTENSION: Generally controlled with Micardis 20 mg daily  Not checking at  home  BP Readings from Last 3 Encounters:  01/01/23 130/80  12/31/22 124/80  11/14/22 132/84      Physical Examination:  BP 130/80 (BP Location: Left Arm, Patient Position: Sitting, Cuff Size: Normal)   Pulse 100   Ht 5' (1.524 m)   Wt 113 lb 9.6 oz (51.5 kg)   SpO2 99%   BMI 22.19 kg/m        ASSESSMENT/PLAN:   Diabetes type 1:  See history of present illness for detailed discussion of  current management, blood sugar patterns and problems identified  A1c is still out of target range at 8.7  She is on basal bolus insulin with Toujeo twice a day and regular insulin at meals  Problems identified: Blood sugars are mostly higher fasting now indicating inadequate basal insulin She also appears to have mostly high readings at dinnertime although less consistent Blood sugars are usually appearing fairly good after dinner with only occasional hypoglycemia last month Her meter time was set incorrectly  Does not appear to be capable of using an insulin pump which would be too complicated   Recommendations for diabetes: She will try to get her libre 3 sensor from [place] now She will let us know when she gets this and go for training Prescription for TRESIBA has been sent and this may work better for her but once a day dosage in the morning only In the meantime she will go up to 30 units today in the morning and 26 at dinnertime unless she started getting low sugars Consider switching to NovoLog also Make sure she takes mealtime insulin 30 minutes before eating If starting to get low sugar again after dinner  may reduce her suppertime dose to 15 units Meanwhile to check more readings after breakfast and dinner  HYPERTENSION: This is controlled with telmisartan 20 mg daily   Total visit time including counseling = 30 minutes  Patient Instructions  Toujeo 30 in am and 26 at dinner  When finished then change to 50 Tresiba only in am, call if high in am    Elayne Snare 01/01/2023, 4:55 PM

## 2023-01-17 ENCOUNTER — Ambulatory Visit: Payer: BC Managed Care – PPO | Attending: Family Medicine | Admitting: Family Medicine

## 2023-01-17 ENCOUNTER — Encounter: Payer: Self-pay | Admitting: Family Medicine

## 2023-01-17 VITALS — BP 145/77 | HR 82 | Ht 59.0 in | Wt 113.2 lb

## 2023-01-17 DIAGNOSIS — E7800 Pure hypercholesterolemia, unspecified: Secondary | ICD-10-CM

## 2023-01-17 DIAGNOSIS — E1159 Type 2 diabetes mellitus with other circulatory complications: Secondary | ICD-10-CM

## 2023-01-17 DIAGNOSIS — E1042 Type 1 diabetes mellitus with diabetic polyneuropathy: Secondary | ICD-10-CM

## 2023-01-17 DIAGNOSIS — Z1211 Encounter for screening for malignant neoplasm of colon: Secondary | ICD-10-CM

## 2023-01-17 DIAGNOSIS — G4709 Other insomnia: Secondary | ICD-10-CM | POA: Diagnosis not present

## 2023-01-17 DIAGNOSIS — I152 Hypertension secondary to endocrine disorders: Secondary | ICD-10-CM

## 2023-01-17 DIAGNOSIS — M5416 Radiculopathy, lumbar region: Secondary | ICD-10-CM | POA: Diagnosis not present

## 2023-01-17 MED ORDER — GABAPENTIN 300 MG PO CAPS: 300.0000 mg | ORAL_CAPSULE | Freq: Every day | ORAL | 1 refills | Status: DC

## 2023-01-17 MED ORDER — TELMISARTAN 20 MG PO TABS: 20.0000 mg | ORAL_TABLET | Freq: Every day | ORAL | 1 refills | Status: DC

## 2023-01-17 MED ORDER — TRAZODONE HCL 50 MG PO TABS: 50.0000 mg | ORAL_TABLET | Freq: Every evening | ORAL | 1 refills | Status: AC | PRN

## 2023-01-17 MED ORDER — ROSUVASTATIN CALCIUM 40 MG PO TABS: 40.0000 mg | ORAL_TABLET | Freq: Every day | ORAL | 1 refills | Status: DC

## 2023-01-17 NOTE — Progress Notes (Signed)
Subjective:  Patient ID: Gabrielle Waller, female    DOB: 1966-07-03  Age: 57 y.o. MRN: [id number]  CC: Back Pain   HPI Gabrielle Waller is a 57 y.o. year old female with a history of hypertension, hyperlipidemia, type 2 diabetes mellitus (A1c 8.0) Diabetes is managed by Endocrine - Dr. Dwyane Dee  Interval History:  For 3 months she has had pain from her lower back down her LEFT LOWER EXTREMITY 5/10 and received ESI which provided mild relief. She is also on Meloxicam.  She is on Gabapentin for neuropathy and should be on a statin and an antihypertensive but informs me she has 'finished taking them' It appears once she ran out she never went to pick up refills.  Her A1c is 8.0 which has improved from 8.7 and she endorses adhering with her insulin regimen. She Complains of insomnia and denies intake of caffeine or daytime naps.  Past Medical History:  Diagnosis Date   Diabetes mellitus without complication ([place])    Kidney stones     Past Surgical History:  Procedure Laterality Date   APPENDECTOMY      Family History  Problem Relation Age of Onset   Diabetes Mother     Social History   Socioeconomic History   Marital status: Married    Spouse name: Not on file   Number of children: 4   Years of education: 12   Highest education level: Not on file  Occupational History   Occupation: Cleaning  Tobacco Use   Smoking status: Never   Smokeless tobacco: Never  Vaping Use   Vaping Use: Never used  Substance and Sexual Activity   Alcohol use: No   Drug use: No   Sexual activity: Not on file  Other Topics Concern   Not on file  Social History Narrative   Fun: walks, listening to music   Denies abuse and feels safe at home.   Social Determinants of Health   Financial Resource Strain: Not on file  Food Insecurity: Not on file  Transportation Needs: Not on file  Physical Activity: Not on file  Stress: Not on file  Social Connections: Not on file    Allergies  Allergen  Reactions   Penicillins Itching    Outpatient Medications Prior to Visit  Medication Sig Dispense Refill   acetaminophen (TYLENOL) 325 MG tablet Take 650 mg by mouth every 6 (six) hours as needed.     aspirin 325 MG EC tablet Take 325 mg by mouth daily.     cetirizine (ZYRTEC) 10 MG tablet Take 1 tablet (10 mg total) by mouth daily. 30 tablet 1   Continuous Blood Gluc Sensor (FREESTYLE LIBRE 3 SENSOR) MISC 1 Device by Does not apply route every 14 (fourteen) days. Apply 1 sensor on upper arm every 14 days for continuous glucose monitoring 2 each 2   famotidine (PEPCID) 20 MG tablet Take 1 tablet (20 mg total) by mouth 2 (two) times daily. 30 tablet 0   fluticasone (FLONASE) 50 MCG/ACT nasal spray Place 2 sprays into both nostrils daily. 16 g 1   Glucagon (BAQSIMI ONE PACK) 3 MG/DOSE POWD Use in nostril for sever low sugar 1 each 1   insulin degludec (TRESIBA FLEXTOUCH) 200 UNIT/ML FlexTouch Pen Inject 50 Units into the skin daily. 9 mL 1   Insulin Disposable Pump (OMNIPOD 5 G6 POD, GEN 5,) MISC 1 Device by Does not apply route every 3 (three) days. 2 each 3   insulin regular (NOVOLIN R RELION)  100 units/mL injection INJECT 25 UNITS SUBCUTANEOUSLY THREE TIMES DAILY BEFORE MEAL(S) 20 mL 3   Insulin Syringe-Needle U-100 (INSULIN SYRINGE .5CC/31GX5/16") 31G X 5/16" 0.5 ML MISC Use to inject insulin 90 each 3   loratadine (CLARITIN) 10 MG tablet Take 1 tablet (10 mg total) by mouth daily. 90 tablet 0   meloxicam (MOBIC) 7.5 MG tablet Take 1 tablet (7.5 mg total) by mouth daily as needed for pain. 30 tablet 0   OneTouch Delica Lancets 99991111 MISC 1 each by Does not apply route in the morning, at noon, and at bedtime. Use onetouch delica lancets to check blood sugar 3 times daily. 100 each 2   ONETOUCH VERIO test strip USE 1 STRIP TO CHECK GLUCOSE THREE TIMES DAILY 100 each 0   Vitamin D, Ergocalciferol, (DRISDOL) 50000 units CAPS capsule Take 50,000 Units by mouth every 7 (seven) days.     gabapentin  (NEURONTIN) 300 MG capsule Take 1 capsule (300 mg total) by mouth at bedtime. 90 capsule 1   rosuvastatin (CRESTOR) 40 MG tablet Take 1 tablet (40 mg total) by mouth daily. 90 tablet 1   telmisartan (MICARDIS) 20 MG tablet Take 1 tablet (20 mg total) by mouth daily. 90 tablet 2   No facility-administered medications prior to visit.     ROS Review of Systems  Constitutional:  Negative for activity change and appetite change.  HENT:  Negative for sinus pressure and sore throat.   Respiratory:  Negative for chest tightness, shortness of breath and wheezing.   Cardiovascular:  Negative for chest pain and palpitations.  Gastrointestinal:  Negative for abdominal distention, abdominal pain and constipation.  Genitourinary: Negative.   Musculoskeletal: Negative.   Psychiatric/Behavioral:  Negative for behavioral problems and dysphoric mood.     Objective:  BP (!) 145/77   Pulse 82   Ht '4\' 11"'$  (1.499 m)   Wt 113 lb 3.2 oz (51.3 kg)   SpO2 98%   BMI 22.86 kg/m      01/17/2023   11:30 AM 01/01/2023    2:14 PM 12/31/2022    1:00 PM  BP/Weight  Systolic BP [id number]  Diastolic BP 77 80 80  Wt. (Lbs) 113.2 113.6 114  BMI 22.86 kg/m2 22.19 kg/m2 22.26 kg/m2      Physical Exam Constitutional:      Appearance: She is well-developed.  Cardiovascular:     Rate and Rhythm: Normal rate.     Heart sounds: Normal heart sounds. No murmur heard. Pulmonary:     Effort: Pulmonary effort is normal.     Breath sounds: Normal breath sounds. No wheezing or rales.  Chest:     Chest wall: No tenderness.  Abdominal:     General: Bowel sounds are normal. There is no distension.     Palpations: Abdomen is soft. There is no mass.     Tenderness: There is no abdominal tenderness.  Musculoskeletal:        General: Normal range of motion.     Right lower leg: No edema.     Left lower leg: No edema.  Neurological:     Mental Status: She is alert and oriented to person, place, and time.   Psychiatric:        Mood and Affect: Mood normal.        Latest Ref Rng & Units 11/13/2022    4:54 PM 08/27/2022   11:17 AM 07/19/2022   11:32 AM  CMP  Glucose 70 - 99 mg/dL 392  200  290   BUN 6 - 20 mg/dL 17   17   Creatinine 0.44 - 1.00 mg/dL 0.83   0.69   Sodium 135 - 145 mmol/L 139   140   Potassium 3.5 - 5.1 mmol/L 4.1   4.6   Chloride 98 - 111 mmol/L 108   101   CO2 22 - 32 mmol/L 22   23   Calcium 8.9 - 10.3 mg/dL 9.4   10.1   Total Protein 6.5 - 8.1 g/dL 7.3   7.2   Total Bilirubin 0.3 - 1.2 mg/dL 0.3   0.3   Alkaline Phos 38 - 126 U/L 167   166   AST 15 - 41 U/L 21   22   ALT 0 - 44 U/L 19   21     Lipid Panel     Component Value Date/Time   CHOL 185 11/20/2021 1036   TRIG 185.0 (H) 11/20/2021 1036   HDL 52.20 11/20/2021 1036   CHOLHDL 4 11/20/2021 1036   VLDL 37.0 11/20/2021 1036   LDLCALC 96 11/20/2021 1036   LDLDIRECT 98.0 05/28/2022 0905    CBC    Component Value Date/Time   WBC 7.0 11/13/2022 1654   RBC 4.03 11/13/2022 1654   HGB 12.4 11/13/2022 1654   HCT 37.0 11/13/2022 1654   PLT 313 11/13/2022 1654   MCV 91.8 11/13/2022 1654   MCH 30.8 11/13/2022 1654   MCHC 33.5 11/13/2022 1654   RDW 12.9 11/13/2022 1654   LYMPHSABS 1.9 11/13/2022 1654   MONOABS 0.3 11/13/2022 1654   EOSABS 0.4 11/13/2022 1654   BASOSABS 0.0 11/13/2022 1654    Lab Results  Component Value Date   HGBA1C 8.0 (A) 11/14/2022    Assessment & Plan:  1. 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  Dispense: 90 capsule; Refill: 1  2. Hypercholesterolemia LDL is at goal but triglycerides are elevated Low-cholesterol diet She has been out of Crestor which I have refilled - rosuvastatin (CRESTOR) 40 MG tablet; Take 1 tablet (40 mg total) by mouth daily.  Dispense: 90 tablet; Refill: 1  3. Other insomnia Controlled Sleep  hygiene - traZODone (DESYREL) 50 MG tablet; Take 1 tablet (50 mg total) by mouth at bedtime as needed for sleep.  Dispense: 90 tablet; Refill: 1  4. Screening for colon cancer - Ambulatory referral to Gastroenterology  5. Lumbar radiculopathy Stable with intermittent flares Status post epidural spinal injections Follow-up with orthopedics  6. Hypertension associated with diabetes ([place]) Uncontrolled due to running out of antihypertensives Refilled Micardis Advised that once she runs out of breath/hypotensive she needs to pick up refills and that medication is chronic Counseled on blood pressure goal of less than 130/80, low-sodium, DASH diet, medication compliance, 150 minutes of moderate intensity exercise per week. Discussed medication compliance, adverse effects. - telmisartan (MICARDIS) 20 MG tablet; Take 1 tablet (20 mg total) by mouth daily.  Dispense: 90 tablet; Refill: 1    Meds ordered this encounter  Medications   gabapentin (NEURONTIN) 300 MG capsule    Sig: Take 1 capsule (300 mg total) by mouth at bedtime.    Dispense:  90 capsule    Refill:  1   rosuvastatin (CRESTOR) 40 MG tablet    Sig: Take 1 tablet (40 mg total) by mouth daily.    Dispense:  90 tablet  Refill:  1   telmisartan (MICARDIS) 20 MG tablet    Sig: Take 1 tablet (20 mg total) by mouth daily.    Dispense:  90 tablet    Refill:  1   traZODone (DESYREL) 50 MG tablet    Sig: Take 1 tablet (50 mg total) by mouth at bedtime as needed for sleep.    Dispense:  90 tablet    Refill:  1    Follow-up: Return in about 6 months (around 07/20/2023) for Chronic medical conditions.       Charlott Rakes, MD, FAAFP. [facility] [place], [place]   01/17/2023, 12:52 PM

## 2023-01-17 NOTE — Patient Instructions (Signed)
Insomnia Insomnia is a sleep disorder that makes it difficult to fall asleep or stay asleep. Insomnia can cause fatigue, low energy, difficulty concentrating, mood swings, and poor performance at work or school. There are three different ways to classify insomnia: Difficulty falling asleep. Difficulty staying asleep. Waking up too early in the morning. Any type of insomnia can be long-term (chronic) or short-term (acute). Both are common. Short-term insomnia usually lasts for 3 months or less. Chronic insomnia occurs at least three times a week for longer than 3 months. What are the causes? Insomnia may be caused by another condition, situation, or substance, such as: Having certain mental health conditions, such as anxiety and depression. Using caffeine, alcohol, tobacco, or drugs. Having gastrointestinal conditions, such as gastroesophageal reflux disease (GERD). Having certain medical conditions. These include: Asthma. Alzheimer's disease. Stroke. Chronic pain. An overactive thyroid gland (hyperthyroidism). Other sleep disorders, such as restless legs syndrome and sleep apnea. Menopause. Sometimes, the cause of insomnia may not be known. What increases the risk? Risk factors for insomnia include: Gender. Females are affected more often than males. Age. Insomnia is more common as people get older. Stress and certain medical and mental health conditions. Lack of exercise. Having an irregular work schedule. This may include working night shifts and traveling between different time zones. What are the signs or symptoms? If you have insomnia, the main symptom is having trouble falling asleep or having trouble staying asleep. This may lead to other symptoms, such as: Feeling tired or having low energy. Feeling nervous about going to sleep. Not feeling rested in the morning. Having trouble concentrating. Feeling irritable, anxious, or depressed. How is this diagnosed? This condition  may be diagnosed based on: Your symptoms and medical history. Your health care provider may ask about: Your sleep habits. Any medical conditions you have. Your mental health. A physical exam. How is this treated? Treatment for insomnia depends on the cause. Treatment may focus on treating an underlying condition that is causing the insomnia. Treatment may also include: Medicines to help you sleep. Counseling or therapy. Lifestyle adjustments to help you sleep better. Follow these instructions at home: Eating and drinking  Limit or avoid alcohol, caffeinated beverages, and products that contain nicotine and tobacco, especially close to bedtime. These can disrupt your sleep. Do not eat a large meal or eat spicy foods right before bedtime. This can lead to digestive discomfort that can make it hard for you to sleep. Sleep habits  Keep a sleep diary to help you and your health care provider figure out what could be causing your insomnia. Write down: When you sleep. When you wake up during the night. How well you sleep and how rested you feel the next day. Any side effects of medicines you are taking. What you eat and drink. Make your bedroom a dark, comfortable place where it is easy to fall asleep. Put up shades or blackout curtains to block light from outside. Use a white noise machine to block noise. Keep the temperature cool. Limit screen use before bedtime. This includes: Not watching TV. Not using your smartphone, tablet, or computer. Stick to a routine that includes going to bed and waking up at the same times every day and night. This can help you fall asleep faster. Consider making a quiet activity, such as reading, part of your nighttime routine. Try to avoid taking naps during the day so that you sleep better at night. Get out of bed if you are still awake after   15 minutes of trying to sleep. Keep the lights down, but try reading or doing a quiet activity. When you feel  sleepy, go back to bed. General instructions Take over-the-counter and prescription medicines only as told by your health care provider. Exercise regularly as told by your health care provider. However, avoid exercising in the hours right before bedtime. Use relaxation techniques to manage stress. Ask your health care provider to suggest some techniques that may work well for you. These may include: Breathing exercises. Routines to release muscle tension. Visualizing peaceful scenes. Make sure that you drive carefully. Do not drive if you feel very sleepy. Keep all follow-up visits. This is important. Contact a health care provider if: You are tired throughout the day. You have trouble in your daily routine due to sleepiness. You continue to have sleep problems, or your sleep problems get worse. Get help right away if: You have thoughts about hurting yourself or someone else. Get help right away if you feel like you may hurt yourself or others, or have thoughts about taking your own life. Go to your nearest emergency room or: Call 911. Call the National Suicide Prevention Lifeline at 1-800-273-8255 or 988. This is open 24 hours a day. Text the Crisis Text Line at 741741. Summary Insomnia is a sleep disorder that makes it difficult to fall asleep or stay asleep. Insomnia can be long-term (chronic) or short-term (acute). Treatment for insomnia depends on the cause. Treatment may focus on treating an underlying condition that is causing the insomnia. Keep a sleep diary to help you and your health care provider figure out what could be causing your insomnia. This information is not intended to replace advice given to you by your health care provider. Make sure you discuss any questions you have with your health care provider. Document Revised: 10/02/2021 Document Reviewed: 10/02/2021 Elsevier Patient Education  2023 Elsevier Inc.  

## 2023-01-17 NOTE — Progress Notes (Signed)
Pain back and leg.

## 2023-02-12 ENCOUNTER — Telehealth: Payer: Self-pay | Admitting: Endocrinology

## 2023-02-12 DIAGNOSIS — E1065 Type 1 diabetes mellitus with hyperglycemia: Secondary | ICD-10-CM

## 2023-02-12 DIAGNOSIS — M25552 Pain in left hip: Secondary | ICD-10-CM | POA: Diagnosis not present

## 2023-02-12 DIAGNOSIS — M545 Low back pain, unspecified: Secondary | ICD-10-CM | POA: Diagnosis not present

## 2023-02-12 MED ORDER — ONETOUCH VERIO VI STRP: ORAL_STRIP | 1 refills | Status: DC

## 2023-02-12 NOTE — Telephone Encounter (Signed)
MEDICATION: OneTouch Verio ONETOUCH VERIO test strip  PHARMACY:    [id number] - [place] ([place]), [place] - [address] (Ph: [phone])    HAS THE PATIENT CONTACTED THEIR PHARMACY?  Yes  IS THIS A 90 DAY SUPPLY : Yes  IS PATIENT OUT OF MEDICATION: No  IF NOT; HOW MUCH IS LEFT: 3 weeks worth  LAST APPOINTMENT DATE: @2 /27/2024  NEXT APPOINTMENT DATE:@4 /22/2024  DO WE HAVE YOUR PERMISSION TO LEAVE A DETAILED MESSAGE?: Yes  OTHER COMMENTS:    **Let patient know to contact pharmacy at the end of the day to make sure medication is ready. **  ** Please notify patient to allow 48-72 hours to process**  **Encourage patient to contact the pharmacy for refills or they can request refills through [facility]**

## 2023-02-12 NOTE — Telephone Encounter (Signed)
Rx sent 

## 2023-02-20 ENCOUNTER — Encounter: Payer: Self-pay | Admitting: Family Medicine

## 2023-02-21 DIAGNOSIS — M545 Low back pain, unspecified: Secondary | ICD-10-CM | POA: Diagnosis not present

## 2023-02-25 ENCOUNTER — Ambulatory Visit: Payer: BC Managed Care – PPO | Admitting: Endocrinology

## 2023-02-25 ENCOUNTER — Encounter: Payer: Self-pay | Admitting: Endocrinology

## 2023-02-25 VITALS — BP 110/78 | HR 88 | Ht 59.0 in | Wt 115.2 lb

## 2023-02-25 DIAGNOSIS — R7989 Other specified abnormal findings of blood chemistry: Secondary | ICD-10-CM | POA: Diagnosis not present

## 2023-02-25 DIAGNOSIS — I10 Essential (primary) hypertension: Secondary | ICD-10-CM

## 2023-02-25 DIAGNOSIS — E1065 Type 1 diabetes mellitus with hyperglycemia: Secondary | ICD-10-CM | POA: Diagnosis not present

## 2023-02-25 DIAGNOSIS — M25552 Pain in left hip: Secondary | ICD-10-CM | POA: Diagnosis not present

## 2023-02-25 LAB — POCT GLYCOSYLATED HEMOGLOBIN (HGB A1C): Hemoglobin A1C: 8.6 % — AB (ref 4.0–5.6)

## 2023-02-25 MED ORDER — NOVOLOG FLEXPEN 100 UNIT/ML ~~LOC~~ SOPN: PEN_INJECTOR | SUBCUTANEOUS | 1 refills | Status: DC

## 2023-02-25 MED ORDER — TRESIBA FLEXTOUCH 200 UNIT/ML ~~LOC~~ SOPN: 50.0000 [IU] | PEN_INJECTOR | Freq: Every day | SUBCUTANEOUS | 1 refills | Status: DC

## 2023-02-25 NOTE — Patient Instructions (Signed)
Take 18 Toujeo in pm  Tresiba 50 units in replaces [employer]

## 2023-02-25 NOTE — Progress Notes (Unsigned)
Patient ID: Gabrielle Waller, female   DOB: 1966-03-26, 57 y.o.   MRN: [id number]   Reason for Appointment : Follow up for Type 1 Diabetes  History of Present Illness          Diagnosis: Type 1 diabetes mellitus, date of diagnosis: 2001         Past history: Her blood sugar at diagnosis was 1056 and was started on insulin She is typically having difficulty controlling her diabetes because of cost of her medications and compliance She is also having relatively labile blood sugars Has difficulty understanding instructions for day-to-day management for diabetes and although she claims to be compliant with her insulin doses as prescribed she does not often check her blood sugars as directed and not clear how her diet affects her blood sugars   Recent history:   INSULIN regimen is described as: Toujeo 28 units am, 20 units in the evening Novolin regular insulin mostly 25--20-20 units at meals    A1c is 8.6   Current problems and glucose  patterns:  She was told to pick up the Odum sensor and she still has not done so and may be related to the high cost of this Also she said that she has not picked up her Evaristo Bury, was supposed to switch from [facility] to this insulin Her blood sugars are highly erratic but appear to be getting lower overnight Highest blood sugars appear to be midmorning and midday but likely of some may be related to a late breakfast Also blood sugars are highly variable in the evenings and unable to separate pre and postmeal readings She has had a few low sugars during the night through 3 AM No she thinks she is only eating dinner around 5 PM or so Again she has sporadic low sugars late evening also and she thinks that even with 6 units of regular insulin once her blood sugars got low with a light meal She is not as active and not working currently   Self-care:  Meals: 2-3 meals per day.  Breakfast 9-10 am; lunch  12pm and dinner 6-7 p.m.    Glucose monitoring:   done  1-2 times a day         Glucometer: One Touch   Blood Glucose readings and averages from review of monitor as below    Currently has 56% of readings above target and 5% below target   PRE-MEAL Fasting Lunch Dinner Overnight Overall  Glucose range: 48-187   42-159 42-509  Mean/median:  438  71 218   POST-MEAL PC Breakfast PC Lunch PC Dinner  Glucose range:   56-428  Mean/median: 298     Previously:  PRE-MEAL Fasting Lunch Dinner Bedtime Overall  Glucose range: 87-399  119-457    Mean/median:     216   POST-MEAL PC Breakfast PC Lunch PC Dinner  Glucose range: 227-457  35-318  Mean/median:      Dietician visit: Most recent:. ?     She has seen diabetes educator several times in the past      Wt Readings from Last 3 Encounters:  02/25/23 115 lb 3.2 oz (52.3 kg)  01/17/23 113 lb 3.2 oz (51.3 kg)  01/01/23 113 lb 9.6 oz (51.5 kg)     Lab Results  Component Value Date   HGBA1C 8.6 (A) 02/25/2023   HGBA1C 8.0 (A) 11/14/2022   HGBA1C 8.7 (H) 08/27/2022   Lab Results  Component Value Date   MICROALBUR 7.5 (H)  11/20/2021   LDLCALC 96 11/20/2021   CREATININE 0.83 11/13/2022      Lab Results  Component Value Date   MICRALBCREAT 7.6 11/20/2021   MICRALBCREAT 12.6 01/13/2021     Allergies as of 02/25/2023       Reactions   Penicillins Itching        Medication List        Accurate as of February 25, 2023  3:10 PM. If you have any questions, ask your nurse or doctor.          STOP taking these medications    FreeStyle Libre 3 Sensor Misc Stopped by: Reather Littler, MD       TAKE these medications    acetaminophen 325 MG tablet Commonly known as: TYLENOL Take 650 mg by mouth every 6 (six) hours as needed.   aspirin EC 325 MG tablet Take 325 mg by mouth daily.   Baqsimi One Pack 3 MG/DOSE Powd Generic drug: Glucagon Use in nostril for sever low sugar   cetirizine 10 MG tablet Commonly known as: ZYRTEC Take 1 tablet (10 mg total) by mouth  daily.   famotidine 20 MG tablet Commonly known as: PEPCID Take 1 tablet (20 mg total) by mouth 2 (two) times daily.   fluticasone 50 MCG/ACT nasal spray Commonly known as: FLONASE Place 2 sprays into both nostrils daily.   gabapentin 300 MG capsule Commonly known as: NEURONTIN Take 1 capsule (300 mg total) by mouth at bedtime.   insulin regular 100 units/mL injection Commonly known as: NovoLIN R ReliOn INJECT 25 UNITS SUBCUTANEOUSLY THREE TIMES DAILY BEFORE MEAL(S)   INSULIN SYRINGE .5CC/31GX5/16" 31G X 5/16" 0.5 ML Misc Use to inject insulin   loratadine 10 MG tablet Commonly known as: CLARITIN Take 1 tablet (10 mg total) by mouth daily.   meloxicam 7.5 MG tablet Commonly known as: Mobic Take 1 tablet (7.5 mg total) by mouth daily as needed for pain.   Omnipod 5 G6 Pods (Gen 5) Misc 1 Device by Does not apply route every 3 (three) days.   OneTouch Delica Lancets 30G Misc 1 each by Does not apply route in the morning, at noon, and at bedtime. Use onetouch delica lancets to check blood sugar 3 times daily.   OneTouch Verio test strip Generic drug: glucose blood USE 1 STRIP TO CHECK GLUCOSE THREE TIMES DAILY   rosuvastatin 40 MG tablet Commonly known as: CRESTOR Take 1 tablet (40 mg total) by mouth daily.   telmisartan 20 MG tablet Commonly known as: MICARDIS Take 1 tablet (20 mg total) by mouth daily.   traZODone 50 MG tablet Commonly known as: DESYREL Take 1 tablet (50 mg total) by mouth at bedtime as needed for sleep.   Evaristo Bury FlexTouch 200 UNIT/ML FlexTouch Pen Generic drug: insulin degludec Inject 50 Units into the skin daily.   Vitamin D (Ergocalciferol) 1.25 MG (50000 UNIT) Caps capsule Commonly known as: DRISDOL Take 50,000 Units by mouth every 7 (seven) days.        Allergies:  Allergies  Allergen Reactions   Penicillins Itching    Past Medical History:  Diagnosis Date   Diabetes mellitus without complication    Kidney stones      Past Surgical History:  Procedure Laterality Date   APPENDECTOMY      Family History  Problem Relation Age of Onset   Diabetes Mother     Social History:  reports that she has never smoked. She has never used smokeless tobacco. She reports  that she does not drink alcohol and does not use drugs.    Review of Systems:   She has a prior history of hyperthyroidism treated with Tapazole in 2001.   TSH level usually is slightly low without increased T4 or T3.   Lab Results  Component Value Date   FREET4 0.81 11/02/2019   FREET4 0.70 01/26/2019   FREET4 0.69 11/09/2016   TSH 0.28 (L) 05/15/2021   TSH 0.33 (L) 11/02/2019   TSH 0.33 (L) 01/26/2019    She has had significant hyperlipidemia at baseline including high triglycerides. she was taking Lipitor 80 mg Has had inconsistent control of LDL  Last LDL is below 100 after switching to Crestor 40 from Lipitor   Lab Results  Component Value Date   CHOL 185 11/20/2021   CHOL 192 01/26/2019   CHOL 155 07/23/2018   Lab Results  Component Value Date   HDL 52.20 11/20/2021   HDL 48.10 01/26/2019   HDL 41.30 07/23/2018   Lab Results  Component Value Date   LDLCALC 96 11/20/2021   LDLCALC 120 (H) 01/26/2019   LDLCALC 93 07/23/2018   Lab Results  Component Value Date   TRIG 185.0 (H) 11/20/2021   TRIG 121.0 01/26/2019   TRIG 104.0 07/23/2018   Lab Results  Component Value Date   CHOLHDL 4 11/20/2021   CHOLHDL 4 01/26/2019   CHOLHDL 4 07/23/2018   Lab Results  Component Value Date   LDLDIRECT 98.0 05/28/2022   LDLDIRECT 93.0 05/15/2021   LDLDIRECT 105.0 07/08/2020    No history of numbness in the feet but has some pains   last exam was done at [facility] in 5/22 with background retinopathy  HYPERTENSION: Generally controlled with Micardis 20 mg daily  Not checking at home  BP Readings from Last 3 Encounters:  02/25/23 110/78  01/17/23 (!) 145/77  01/01/23 130/80      Physical  Examination:  BP 110/78 (BP Location: Left Arm, Patient Position: Sitting, Cuff Size: Normal)   Pulse 88   Ht  (1.499 m)   Wt 115 lb 3.2 oz (52.3 kg)   SpO2 98%   BMI 23.27 kg/m        ASSESSMENT/PLAN:   Diabetes type 1:  See history of present illness for detailed discussion of  current management, blood sugar patterns and problems identified  A1c is still high at 8.6  She is on basal bolus insulin with Toujeo twice a day and regular insulin at meals  Problems identified: Blood sugars are lower overnight but tend to be higher after about 8-9 AM and not clear if these readings are fasting or after breakfast She may be getting hypoglycemia late at night from long-acting action of the regular insulin at suppertime Variable blood sugars after dinner Difficult to assess her blood sugar patterns because of lack of CGM  Also she is not able to adjust mealtime dose based on what she is eating Lack of insurance coverage for CGM including Dexcom and likely cannot get an insulin pump   Recommendations for diabetes: She will try to get NovoLog insulin instead of regular insulin which may reduce nocturnal hypoglycemia Also needs to try and get Evaristo Bury again to replace Toujeo and [facility] prescription was sent again Although she is a good candidate for insulin pump this appears to be not covered at least the OmniPod but we will need to have the diabetes educator help her figure out whether a different pump might be covered Reduce  Toujeo to 18 units in the evening Try to adjust NovoLog insulin based on carbohydrates Avoid taking more than 25 units of mealtime insulin at bedtime May need a bedtime snack also if still getting low sugars at night  HYPERTENSION: Well controlled with telmisartan 20 mg daily   Total visit time including counseling = 30 minutes  There are no Patient Instructions on file for this visit.    Reather Littler 02/25/2023, 3:10 PM

## 2023-03-01 DIAGNOSIS — M5416 Radiculopathy, lumbar region: Secondary | ICD-10-CM | POA: Diagnosis not present

## 2023-03-05 ENCOUNTER — Encounter: Payer: BC Managed Care – PPO | Attending: Endocrinology | Admitting: Nutrition

## 2023-03-05 VITALS — Wt 115.2 lb

## 2023-03-05 DIAGNOSIS — E1065 Type 1 diabetes mellitus with hyperglycemia: Secondary | ICD-10-CM

## 2023-03-05 NOTE — Progress Notes (Signed)
Patient reports that the Gabrielle Waller is $300.00/month, and she can not afford this.   Insulin dose:  Toujeo 28u at 7PM                        R:  20u ac all meals.  Reports waiting at least 30 minutes after injecting insulin before eating her meals.   SBGM:  using a meter.  Did not bring meter.  Says acB:  high 200s, to low 300s, acS: 150s, HS: 150-170.    Insurance company called and they prefer the Dexcom, but will require a prior auth.   Typical meals: Bfast:  usually 2 pieces of toast with peanut butter, or peanut butter sandwich.  But says some days she does not eat breakfast, because she is feeling full.   Occasional cereal and milk-once or twice a week. Lunch:  sandwich, or 2 tortillas, with no chips or sides.  Diet drink  Supper:  6-7PM: Meat and vegetables, with some rice, or other starchy veg. Like potatos or corn.   Denies snacking between meals.   Discussion:   When not eating breakfast, will need small 2-3 crackers with peanut butter and will need to take R insulin--at least 5u When eating cereal, take 25u, and make sure to have some protein with this. Will need to increase PM dose of Tresiba to 30u.   4.  Test ac and HS and call me in one week.

## 2023-03-05 NOTE — Patient Instructions (Signed)
Take R insulin-5u when not eating breakfast, and eat 2-3 crackers with some peanut butter When eating cereal, take 25u. Increase PM dose of Toujeo from 28u to 30u. Test blood sugars before meals and at bedtime and call me in one week.

## 2023-03-06 ENCOUNTER — Other Ambulatory Visit: Payer: Self-pay | Admitting: Endocrinology

## 2023-03-06 MED ORDER — DEXCOM G7 RECEIVER DEVI: 0 refills | Status: DC

## 2023-03-06 MED ORDER — DEXCOM G7 SENSOR MISC: 1.0000 | 3 refills | Status: DC

## 2023-03-06 NOTE — Telephone Encounter (Signed)
Please let her know that Dexcom has been sent to [facility] pharmacy and she will get a call from them.  Also need to know if she is waking up with readings over 200 still and how much Toujeo she is taking in the evening

## 2023-03-07 DIAGNOSIS — M5416 Radiculopathy, lumbar region: Secondary | ICD-10-CM | POA: Diagnosis not present

## 2023-03-19 ENCOUNTER — Encounter: Payer: Self-pay | Admitting: Family Medicine

## 2023-03-19 ENCOUNTER — Ambulatory Visit: Payer: BC Managed Care – PPO | Attending: Family Medicine | Admitting: Family Medicine

## 2023-03-19 VITALS — BP 139/82 | HR 98 | Ht 59.0 in | Wt 116.6 lb

## 2023-03-19 DIAGNOSIS — M79652 Pain in left thigh: Secondary | ICD-10-CM | POA: Diagnosis not present

## 2023-03-19 MED ORDER — DULOXETINE HCL 60 MG PO CPEP: 60.0000 mg | ORAL_CAPSULE | Freq: Every day | ORAL | 1 refills | Status: DC

## 2023-03-19 NOTE — Patient Instructions (Signed)
Chronic Pain, Adult Chronic pain is a type of pain that lasts or keeps coming back for at least 3-6 months. You may have headaches, pain in the abdomen, or pain in other areas of the body. Chronic pain may be related to an illness, injury, or a health condition. Sometimes, the cause of chronic pain is not known. Chronic pain can make it hard for you to do daily activities. If it is not treated, chronic pain can lead to anxiety and depression. Treatment depends on the cause of your pain and how severe it is. You may need to work with a pain specialist to come up with a treatment plan. Many people benefit from two or more types of treatment to control their pain. Follow these instructions at home: Treatment plan Follow your treatment plan as told by your health care provider. This may include: Gentle, regular exercise. Eating a healthy diet that includes foods such as vegetables, fruits, fish, and lean meats. Mental health therapy (cognitive or behavioral therapy) that changes the way you think or act in response to the pain. This may help improve how you feel. Doing physical therapy exercises to improve movement and strength. Meditation, yoga, acupuncture, or massage therapy. Using the oils from plants in your environment or on your skin (aromatherapy). Other treatments may include: Over-the-counter or prescription medicines. Color, light, or sound therapy. Local electrical stimulation. The electrical pulses help to relieve pain by temporarily stopping the nerve impulses that cause you to feel pain. Injections. These deliver numbing or pain-relieving medicines into the spine or the area of pain.  Medicines Take over-the-counter and prescription medicines only as told by your health care provider. Ask your health care provider if the medicine prescribed to you: Requires you to avoid driving or using machinery. Can cause constipation. You may need to take these actions to prevent or treat  constipation: Drink enough fluid to keep your urine pale yellow. Take over-the-counter or prescription medicines. Eat foods that are high in fiber, such as beans, whole grains, and fresh fruits and vegetables. Limit foods that are high in fat and processed sugars, such as fried or sweet foods. Lifestyle  Ask your health care provider whether you should keep a pain diary. Your health care provider will tell you what information to write in the diary. This may include: When you have pain. What the pain feels like. How medicines and other behaviors or treatments help to reduce the pain. Consider talking with a mental health care provider about how to help manage chronic pain. Consider joining a chronic pain support group. Try to control or lower your stress levels. Talk with your health care provider about ways to do this. General instructions Learn as much as you can about how to manage your chronic pain. Ask your health care provider if an intensive pain rehabilitation program or a chronic pain specialist would be helpful. Check your pain level as told by your health care provider. Ask your health care provider if you should use a pain scale. Contact a health care provider if: Your pain is not controlled with treatment. You have new pain. You have side effects from pain medicine. You feel weak or you have trouble doing your normal activities. You have trouble sleeping or you develop confusion. You lose feeling or have numbness in your body. You lose control of your bowels or bladder. Get help right away if: Your pain suddenly gets much worse. You develop chest pain. You have trouble breathing or shortness of   breath. You faint, or another person sees you faint. These symptoms may be an emergency. Get help right away. Call 911. Do not wait to see if the symptoms will go away. Do not drive yourself to the hospital. Also, get help right away if: You have thoughts about hurting yourself  or others. Take one of these steps if you feel like you may hurt yourself or others, or have thoughts about taking your own life: Go to your nearest emergency room. Call 911. Call the National Suicide Prevention Lifeline at 1-800-273-8255 or 988. This is open 24 hours a day. Text the Crisis Text Line at 741741. This information is not intended to replace advice given to you by your health care provider. Make sure you discuss any questions you have with your health care provider. Document Revised: 06/13/2022 Document Reviewed: 05/16/2022 Elsevier Patient Education  2023 [employer] Inc.  

## 2023-03-19 NOTE — Progress Notes (Signed)
Subjective:  Patient ID: Gabrielle Waller, female    DOB: 06/21/1966  Age: 57 y.o. MRN: [id number]  CC: Leg Pain   HPI Nadia Dunson is a 57 y.o. year old female with a history of hypertension, hyperlipidemia, type 2 diabetes mellitus (A1c 8.0) Diabetes is managed by Endocrine - Dr. Lucianne Muss  Interval History:  She Complains of pain in her left thigh x5 months and has received back corticosteroid injection from her Orthopedic - Delbert Harness with no relief. She is also undergoing Physical Therapy and is also on Gabapentin.  Pain is worse when she walks.  Denies presence of numbness. Left femur x-ray from 11/2022 was negative for acute findings.  Past Medical History:  Diagnosis Date   Diabetes mellitus without complication (HCC)    Kidney stones     Past Surgical History:  Procedure Laterality Date   APPENDECTOMY      Family History  Problem Relation Age of Onset   Diabetes Mother     Social History   Socioeconomic History   Marital status: Married    Spouse name: Not on file   Number of children: 4   Years of education: 12   Highest education level: Not on file  Occupational History   Occupation: Cleaning  Tobacco Use   Smoking status: Never   Smokeless tobacco: Never  Vaping Use   Vaping Use: Never used  Substance and Sexual Activity   Alcohol use: No   Drug use: No   Sexual activity: Not on file  Other Topics Concern   Not on file  Social History Narrative   Fun: walks, listening to music   Denies abuse and feels safe at home.   Social Determinants of Health   Financial Resource Strain: Not on file  Food Insecurity: Not on file  Transportation Needs: Not on file  Physical Activity: Not on file  Stress: Not on file  Social Connections: Not on file    Allergies  Allergen Reactions   Penicillins Itching    Outpatient Medications Prior to Visit  Medication Sig Dispense Refill   acetaminophen (TYLENOL) 325 MG tablet Take 650 mg by mouth every 6 (six) hours  as needed.     aspirin 325 MG EC tablet Take 325 mg by mouth daily.     cetirizine (ZYRTEC) 10 MG tablet Take 1 tablet (10 mg total) by mouth daily. 30 tablet 1   Continuous Glucose Receiver (DEXCOM G7 RECEIVER) DEVI To display CGM data 1 each 0   Continuous Glucose Sensor (DEXCOM G7 SENSOR) MISC 1 Device by Does not apply route as directed. Change sensor every 10 days 3 each 3   famotidine (PEPCID) 20 MG tablet Take 1 tablet (20 mg total) by mouth 2 (two) times daily. 30 tablet 0   fluticasone (FLONASE) 50 MCG/ACT nasal spray Place 2 sprays into both nostrils daily. 16 g 1   gabapentin (NEURONTIN) 300 MG capsule Take 1 capsule (300 mg total) by mouth at bedtime. 90 capsule 1   Glucagon (BAQSIMI ONE PACK) 3 MG/DOSE POWD Use in nostril for sever low sugar 1 each 1   glucose blood (ONETOUCH VERIO) test strip USE 1 STRIP TO CHECK GLUCOSE THREE TIMES DAILY 300 each 1   insulin aspart (NOVOLOG FLEXPEN) 100 UNIT/ML FlexPen 18-22 units before meals 15 mL 1   insulin degludec (TRESIBA FLEXTOUCH) 200 UNIT/ML FlexTouch Pen Inject 50 Units into the skin daily. 9 mL 1   Insulin Disposable Pump (OMNIPOD 5 G6 POD, GEN 5,)  MISC 1 Device by Does not apply route every 3 (three) days. 2 each 3   Insulin Syringe-Needle U-100 (INSULIN SYRINGE .5CC/31GX5/16") 31G X 5/16" 0.5 ML MISC Use to inject insulin 90 each 3   loratadine (CLARITIN) 10 MG tablet Take 1 tablet (10 mg total) by mouth daily. 90 tablet 0   meloxicam (MOBIC) 7.5 MG tablet Take 1 tablet (7.5 mg total) by mouth daily as needed for pain. 30 tablet 0   OneTouch Delica Lancets 30G MISC 1 each by Does not apply route in the morning, at noon, and at bedtime. Use onetouch delica lancets to check blood sugar 3 times daily. 100 each 2   rosuvastatin (CRESTOR) 40 MG tablet Take 1 tablet (40 mg total) by mouth daily. 90 tablet 1   telmisartan (MICARDIS) 20 MG tablet Take 1 tablet (20 mg total) by mouth daily. 90 tablet 1   traZODone (DESYREL) 50 MG tablet Take 1  tablet (50 mg total) by mouth at bedtime as needed for sleep. 90 tablet 1   Vitamin D, Ergocalciferol, (DRISDOL) 50000 units CAPS capsule Take 50,000 Units by mouth every 7 (seven) days.     No facility-administered medications prior to visit.     ROS Review of Systems  Constitutional:  Negative for activity change and appetite change.  HENT:  Negative for sinus pressure and sore throat.   Respiratory:  Negative for chest tightness, shortness of breath and wheezing.   Cardiovascular:  Negative for chest pain and palpitations.  Gastrointestinal:  Negative for abdominal distention, abdominal pain and constipation.  Genitourinary: Negative.   Musculoskeletal:        See HPI  Psychiatric/Behavioral:  Negative for behavioral problems and dysphoric mood.     Objective:  BP 139/82   Pulse 98   Ht 4\' 11"  (1.499 m)   Wt 116 lb 9.6 oz (52.9 kg)   SpO2 99%   BMI 23.55 kg/m      03/19/2023    1:43 PM 03/05/2023   11:33 AM 02/25/2023    2:54 PM  BP/Weight  Systolic BP 139  409  Diastolic BP 82  78  Wt. (Lbs) 116.6 115.2 115.2  BMI 23.55 kg/m2 23.27 kg/m2 23.27 kg/m2      Physical Exam Constitutional:      Appearance: She is well-developed.  Cardiovascular:     Rate and Rhythm: Normal rate.     Heart sounds: Normal heart sounds. No murmur heard. Pulmonary:     Effort: Pulmonary effort is normal.     Breath sounds: Normal breath sounds. No wheezing or rales.  Chest:     Chest wall: No tenderness.  Abdominal:     General: Bowel sounds are normal. There is no distension.     Palpations: Abdomen is soft. There is no mass.     Tenderness: There is no abdominal tenderness.  Musculoskeletal:     Right lower leg: No edema.     Left lower leg: No edema.     Comments: Get a straight leg raise bilaterally No tenderness on palpation of back or deep palpation of thighs.  Neurological:     Mental Status: She is alert and oriented to person, place, and time.  Psychiatric:         Mood and Affect: Mood normal.        Latest Ref Rng & Units 11/13/2022    4:54 PM 08/27/2022   11:17 AM 07/19/2022   11:32 AM  CMP  Glucose 70 - 99  mg/dL 161  096  045   BUN 6 - 20 mg/dL 17   17   Creatinine 4.09 - 1.00 mg/dL 8.11   9.14   Sodium 782 - 145 mmol/L 139   140   Potassium 3.5 - 5.1 mmol/L 4.1   4.6   Chloride 98 - 111 mmol/L 108   101   CO2 22 - 32 mmol/L 22   23   Calcium 8.9 - 10.3 mg/dL 9.4   95.6   Total Protein 6.5 - 8.1 g/dL 7.3   7.2   Total Bilirubin 0.3 - 1.2 mg/dL 0.3   0.3   Alkaline Phos 38 - 126 U/L 167   166   AST 15 - 41 U/L 21   22   ALT 0 - 44 U/L 19   21     Lipid Panel     Component Value Date/Time   CHOL 185 11/20/2021 1036   TRIG 185.0 (H) 11/20/2021 1036   HDL 52.20 11/20/2021 1036   CHOLHDL 4 11/20/2021 1036   VLDL 37.0 11/20/2021 1036   LDLCALC 96 11/20/2021 1036   LDLDIRECT 98.0 05/28/2022 0905    CBC    Component Value Date/Time   WBC 7.0 11/13/2022 1654   RBC 4.03 11/13/2022 1654   HGB 12.4 11/13/2022 1654   HCT 37.0 11/13/2022 1654   PLT 313 11/13/2022 1654   MCV 91.8 11/13/2022 1654   MCH 30.8 11/13/2022 1654   MCHC 33.5 11/13/2022 1654   RDW 12.9 11/13/2022 1654   LYMPHSABS 1.9 11/13/2022 1654   MONOABS 0.3 11/13/2022 1654   EOSABS 0.4 11/13/2022 1654   BASOSABS 0.0 11/13/2022 1654    Lab Results  Component Value Date   HGBA1C 8.6 (A) 02/25/2023    Assessment & Plan:  1. Pain of left thigh Uncontrolled on meloxicam and gabapentin Will add on Cymbalta. - DULoxetine (CYMBALTA) 60 MG capsule; Take 1 capsule (60 mg total) by mouth daily. For chronic pain  Dispense: 90 capsule; Refill: 1  Meds ordered this encounter  Medications   DULoxetine (CYMBALTA) 60 MG capsule    Sig: Take 1 capsule (60 mg total) by mouth daily. For chronic pain    Dispense:  90 capsule    Refill:  1    Follow-up: Return in about 6 weeks (around 04/30/2023) for CPE/ Preventive Health Exam.       Hoy Register, MD, FAAFP. [facility] [place], [place] [phone]   03/19/2023, 1:59 PM

## 2023-03-22 ENCOUNTER — Telehealth: Payer: Self-pay

## 2023-03-22 NOTE — Telephone Encounter (Signed)
   Telephone encounter was:  Unsuccessful.  03/22/2023 Name: Gabrielle Waller MRN: [id number] DOB: 07/08/1966  Unsuccessful outbound call made today to assist with:   Colorectal Cancer Screening  Outreach Attempt:  1st Attempt  A HIPAA compliant voice message was left requesting a return call.  Instructed patient to call back    Lenard Forth [facility] Guide, [facility] [phone] [address] [place], [place], [place] [address] Phone: [phone] Email: [email or web address]

## 2023-03-25 ENCOUNTER — Telehealth: Payer: Self-pay

## 2023-03-25 NOTE — Telephone Encounter (Signed)
   Telephone encounter was:  Successful.  03/25/2023 Name: Gabrielle Waller MRN: [id number] DOB: Jan 29, 1966  Gabrielle Waller is a 57 y.o. year old female who is a primary care patient of Hoy Register, MD . The community resource team was consulted for assistance with  Colorectal Cancer Screening  Care guide performed the following interventions: Patient provided with information about care guide support team and interviewed to confirm resource needs.Patient will be calling into Dr Earley Abide office to make an appointment   Follow Up Plan:  No further follow up planned at this time. The patient has been provided with needed resources.    Lenard Forth [facility] Guide, [facility] [phone] [address] [place], [place], [place] [address] Phone: [phone] Email: [email or web address]

## 2023-05-06 ENCOUNTER — Ambulatory Visit: Payer: BC Managed Care – PPO | Attending: Family Medicine | Admitting: Family Medicine

## 2023-05-06 ENCOUNTER — Other Ambulatory Visit (HOSPITAL_COMMUNITY)
Admission: RE | Admit: 2023-05-06 | Discharge: 2023-05-06 | Disposition: A | Discharge status: Home / Self Care | Payer: BC Managed Care – PPO | Source: Ambulatory Visit | Attending: Family Medicine | Admitting: Family Medicine

## 2023-05-06 VITALS — BP 148/86 | HR 97 | Temp 98.2°F | Ht 59.0 in | Wt 116.4 lb

## 2023-05-06 DIAGNOSIS — Z794 Long term (current) use of insulin: Secondary | ICD-10-CM

## 2023-05-06 DIAGNOSIS — Z124 Encounter for screening for malignant neoplasm of cervix: Secondary | ICD-10-CM | POA: Insufficient documentation

## 2023-05-06 DIAGNOSIS — E1069 Type 1 diabetes mellitus with other specified complication: Secondary | ICD-10-CM | POA: Diagnosis not present

## 2023-05-06 DIAGNOSIS — I152 Hypertension secondary to endocrine disorders: Secondary | ICD-10-CM

## 2023-05-06 DIAGNOSIS — Z23 Encounter for immunization: Secondary | ICD-10-CM | POA: Diagnosis not present

## 2023-05-06 DIAGNOSIS — E1159 Type 2 diabetes mellitus with other circulatory complications: Secondary | ICD-10-CM

## 2023-05-06 DIAGNOSIS — Z0001 Encounter for general adult medical examination with abnormal findings: Secondary | ICD-10-CM | POA: Diagnosis not present

## 2023-05-06 NOTE — Addendum Note (Signed)
Addended by: Ronette Deter on: 05/06/2023 04:42 PM   Modules accepted: Orders

## 2023-05-06 NOTE — Patient Instructions (Addendum)

## 2023-05-06 NOTE — Progress Notes (Signed)
CPE

## 2023-05-06 NOTE — Progress Notes (Signed)
Subjective:  Patient ID: Gabrielle Waller, female    DOB: [birth year]  Age: 57 y.o. MRN: [id number]  CC: Annual Exam and Gynecologic Exam   HPI [name] is a 57 y.o. year old female with a history of hypertension, hyperlipidemia, type 2 diabetes mellitus (A1c 8.6) Diabetes is managed by Endocrine - Dr. Lucianne Muss  Interval History:  She presents today for complete physical exam. Her mammogram was normal from 10/2022. She is due for a Pap smear today. I had referred her for colonoscopy and per notes GI had sent her a letter but she states she never got it. Her blood pressure is elevated today.       Past Medical History:  Diagnosis Date   Diabetes mellitus without complication (HCC)    Kidney stones     Past Surgical History:  Procedure Laterality Date   APPENDECTOMY      Family History  Problem Relation Age of Onset   Diabetes Mother     Social History   Socioeconomic History   Marital status: Married    Spouse name: Not on file   Number of children: 4   Years of education: 12   Highest education level: Not on file  Occupational History   Occupation: Cleaning  Tobacco Use   Smoking status: Never   Smokeless tobacco: Never  Vaping Use   Vaping Use: Never used  Substance and Sexual Activity   Alcohol use: No   Drug use: No   Sexual activity: Not on file  Other Topics Concern   Not on file  Social History Narrative   Fun: walks, listening to music   Denies abuse and feels safe at home.   Social Determinants of Health   Financial Resource Strain: Not on file  Food Insecurity: Not on file  Transportation Needs: Not on file  Physical Activity: Not on file  Stress: Not on file  Social Connections: Not on file    Allergies  Allergen Reactions   Penicillins Itching    Outpatient Medications Prior to Visit  Medication Sig Dispense Refill   acetaminophen (TYLENOL) 325 MG tablet Take 650 mg by mouth every 6 (six) hours as needed.     aspirin 325 MG EC tablet  Take 325 mg by mouth daily.     cetirizine (ZYRTEC) 10 MG tablet Take 1 tablet (10 mg total) by mouth daily. 30 tablet 1   Continuous Glucose Receiver (DEXCOM G7 RECEIVER) DEVI To display CGM data 1 each 0   Continuous Glucose Sensor (DEXCOM G7 SENSOR) MISC 1 Device by Does not apply route as directed. Change sensor every 10 days 3 each 3   DULoxetine (CYMBALTA) 60 MG capsule Take 1 capsule (60 mg total) by mouth daily. For chronic pain 90 capsule 1   famotidine (PEPCID) 20 MG tablet Take 1 tablet (20 mg total) by mouth 2 (two) times daily. 30 tablet 0   fluticasone (FLONASE) 50 MCG/ACT nasal spray Place 2 sprays into both nostrils daily. 16 g 1   gabapentin (NEURONTIN) 300 MG capsule Take 1 capsule (300 mg total) by mouth at bedtime. 90 capsule 1   Glucagon (BAQSIMI ONE PACK) 3 MG/DOSE POWD Use in nostril for sever low sugar 1 each 1   glucose blood (ONETOUCH VERIO) test strip USE 1 STRIP TO CHECK GLUCOSE THREE TIMES DAILY 300 each 1   insulin aspart (NOVOLOG FLEXPEN) 100 UNIT/ML FlexPen 18-22 units before meals 15 mL 1   insulin degludec (TRESIBA FLEXTOUCH) 200 UNIT/ML FlexTouch Pen  Inject 50 Units into the skin daily. 9 mL 1   Insulin Disposable Pump (OMNIPOD 5 G6 POD, GEN 5,) MISC 1 Device by Does not apply route every 3 (three) days. 2 each 3   Insulin Syringe-Needle U-100 (INSULIN SYRINGE .5CC/31GX5/16") 31G X 5/16" 0.5 ML MISC Use to inject insulin 90 each 3   loratadine (CLARITIN) 10 MG tablet Take 1 tablet (10 mg total) by mouth daily. 90 tablet 0   meloxicam (MOBIC) 7.5 MG tablet Take 1 tablet (7.5 mg total) by mouth daily as needed for pain. 30 tablet 0   OneTouch Delica Lancets 30G MISC 1 each by Does not apply route in the morning, at noon, and at bedtime. Use onetouch delica lancets to check blood sugar 3 times daily. 100 each 2   rosuvastatin (CRESTOR) 40 MG tablet Take 1 tablet (40 mg total) by mouth daily. 90 tablet 1   telmisartan (MICARDIS) 20 MG tablet Take 1 tablet (20 mg  total) by mouth daily. 90 tablet 1   traZODone (DESYREL) 50 MG tablet Take 1 tablet (50 mg total) by mouth at bedtime as needed for sleep. 90 tablet 1   Vitamin D, Ergocalciferol, (DRISDOL) 50000 units CAPS capsule Take 50,000 Units by mouth every 7 (seven) days.     No facility-administered medications prior to visit.     ROS Review of Systems  Constitutional:  Negative for activity change, appetite change and fatigue.  HENT:  Negative for congestion, sinus pressure and sore throat.   Eyes:  Negative for visual disturbance.  Respiratory:  Negative for cough, chest tightness, shortness of breath and wheezing.   Cardiovascular:  Negative for chest pain and palpitations.  Gastrointestinal:  Negative for abdominal distention, abdominal pain and constipation.  Endocrine: Negative for polydipsia.  Genitourinary:  Negative for dysuria and frequency.  Musculoskeletal:  Negative for arthralgias and back pain.  Skin:  Negative for rash.  Neurological:  Negative for tremors, light-headedness and numbness.  Hematological:  Does not bruise/bleed easily.  Psychiatric/Behavioral:  Negative for agitation and behavioral problems.     Objective:  BP (!) 158/85   Pulse 97   Temp 98.2 F (36.8 C) (Oral)   Ht 4\' 11"  (1.499 m)   Wt 116 lb 6.4 oz (52.8 kg)   SpO2 99%   BMI 23.51 kg/m      05/06/2023    2:40 PM 03/19/2023    1:43 PM 03/05/2023   11:33 AM  BP/Weight  Systolic BP 158 139   Diastolic BP 85 82   Wt. (Lbs) 116.4 116.6 115.2  BMI 23.51 kg/m2 23.55 kg/m2 23.27 kg/m2      Physical Exam Exam conducted with a chaperone present.  Constitutional:      General: She is not in acute distress.    Appearance: She is well-developed. She is not diaphoretic.  HENT:     Head: Normocephalic.     Right Ear: External ear normal.     Left Ear: External ear normal.     Nose: Nose normal.  Eyes:     Conjunctiva/sclera: Conjunctivae normal.     Pupils: Pupils are equal, round, and reactive to  light.  Neck:     Vascular: No JVD.  Cardiovascular:     Rate and Rhythm: Normal rate and regular rhythm.     Heart sounds: Normal heart sounds. No murmur heard.    No gallop.  Pulmonary:     Effort: Pulmonary effort is normal. No respiratory distress.  Breath sounds: Normal breath sounds. No wheezing or rales.  Chest:     Chest wall: No tenderness.  Breasts:    Right: Normal. No mass, nipple discharge or tenderness.     Left: Normal. No mass, nipple discharge or tenderness.  Abdominal:     General: Bowel sounds are normal. There is no distension.     Palpations: Abdomen is soft. There is no mass.     Tenderness: There is no abdominal tenderness.     Hernia: There is no hernia in the left inguinal area or right inguinal area.  Genitourinary:    General: Normal vulva.     Pubic Area: No rash.      Labia:        Right: No rash.        Left: No rash.      Vagina: Normal.     Cervix: Normal.     Uterus: Normal.      Adnexa: Right adnexa normal and left adnexa normal.       Right: No tenderness.         Left: No tenderness.    Musculoskeletal:        General: No tenderness. Normal range of motion.     Cervical back: Normal range of motion. No tenderness.  Lymphadenopathy:     Upper Body:     Right upper body: No supraclavicular or axillary adenopathy.     Left upper body: No supraclavicular or axillary adenopathy.  Skin:    General: Skin is warm and dry.  Neurological:     Mental Status: She is alert and oriented to person, place, and time.     Deep Tendon Reflexes: Reflexes are normal and symmetric.        Latest Ref Rng & Units 11/13/2022    4:54 PM 08/27/2022   11:17 AM 07/19/2022   11:32 AM  CMP  Glucose 70 - 99 mg/dL 161  096  045   BUN 6 - 20 mg/dL 17   17   Creatinine 4.09 - 1.00 mg/dL 8.11   9.14   Sodium 782 - 145 mmol/L 139   140   Potassium 3.5 - 5.1 mmol/L 4.1   4.6   Chloride 98 - 111 mmol/L 108   101   CO2 22 - 32 mmol/L 22   23   Calcium 8.9 -  10.3 mg/dL 9.4   95.6   Total Protein 6.5 - 8.1 g/dL 7.3   7.2   Total Bilirubin 0.3 - 1.2 mg/dL 0.3   0.3   Alkaline Phos 38 - 126 U/L 167   166   AST 15 - 41 U/L 21   22   ALT 0 - 44 U/L 19   21     Lipid Panel     Component Value Date/Time   CHOL 185 11/20/2021 1036   TRIG 185.0 (H) 11/20/2021 1036   HDL 52.20 11/20/2021 1036   CHOLHDL 4 11/20/2021 1036   VLDL 37.0 11/20/2021 1036   LDLCALC 96 11/20/2021 1036   LDLDIRECT 98.0 05/28/2022 0905    CBC    Component Value Date/Time   WBC 7.0 11/13/2022 1654   RBC 4.03 11/13/2022 1654   HGB 12.4 11/13/2022 1654   HCT 37.0 11/13/2022 1654   PLT 313 11/13/2022 1654   MCV 91.8 11/13/2022 1654   MCH 30.8 11/13/2022 1654   MCHC 33.5 11/13/2022 1654   RDW 12.9 11/13/2022 1654   LYMPHSABS 1.9 11/13/2022 1654  MONOABS 0.3 11/13/2022 1654   EOSABS 0.4 11/13/2022 1654   BASOSABS 0.0 11/13/2022 1654    Lab Results  Component Value Date   HGBA1C 8.6 (A) 02/25/2023    Assessment & Plan:  1. Annual visit for general adult medical examination with abnormal findings Counseled on 150 minutes of exercise per week, healthy eating (including decreased daily intake of saturated fats, cholesterol, added sugars, sodium), STI prevention, routine healthcare maintenance.  - LP+Non-HDL Cholesterol; Future  2. Screening for cervical cancer - Cytology - PAP  3. Type 1 diabetes mellitus with other specified complication (HCC) - Microalbumin/Creatinine Ratio, Urine; Future  4. Hypertension associated with diabetes (HCC) Uncontrolled She is currently on telmisartan I will see her back in 2 weeks to reassess blood pressure and increase dose of telmisartan if still elevated Counseled on blood pressure goal of less than 130/80, low-sodium, DASH diet, medication compliance, 150 minutes of moderate intensity exercise per week. Discussed medication compliance, adverse effects.               Health Care Maintenance: Referred for  Colonoscopy in 01/2023 but GI was unable to reach her to schedule No orders of the defined types were placed in this encounter.   Follow-up: No follow-ups on file.       Hoy Register, MD, FAAFP. [facility] [place], [place] [phone]   05/06/2023, 3:00 PM

## 2023-05-08 LAB — CYTOLOGY - PAP
Comment: NEGATIVE
Diagnosis: NEGATIVE
High risk HPV: NEGATIVE

## 2023-05-16 ENCOUNTER — Other Ambulatory Visit: Payer: BC Managed Care – PPO

## 2023-05-20 ENCOUNTER — Ambulatory Visit: Payer: BC Managed Care – PPO | Admitting: Endocrinology

## 2023-05-22 ENCOUNTER — Ambulatory Visit: Payer: BC Managed Care – PPO | Admitting: Family Medicine

## 2023-06-03 ENCOUNTER — Other Ambulatory Visit (INDEPENDENT_AMBULATORY_CARE_PROVIDER_SITE_OTHER): Payer: BC Managed Care – PPO

## 2023-06-03 DIAGNOSIS — E1065 Type 1 diabetes mellitus with hyperglycemia: Secondary | ICD-10-CM | POA: Diagnosis not present

## 2023-06-03 DIAGNOSIS — E7800 Pure hypercholesterolemia, unspecified: Secondary | ICD-10-CM | POA: Diagnosis not present

## 2023-06-03 DIAGNOSIS — R7989 Other specified abnormal findings of blood chemistry: Secondary | ICD-10-CM | POA: Diagnosis not present

## 2023-06-03 LAB — BASIC METABOLIC PANEL
BUN: 23 mg/dL (ref 6–23)
CO2: 26 mEq/L (ref 19–32)
Calcium: 9.8 mg/dL (ref 8.4–10.5)
Chloride: 101 mEq/L (ref 96–112)
Creatinine, Ser: 0.96 mg/dL (ref 0.40–1.20)
GFR: 65.95 mL/min (ref >60.00)
Glucose, Bld: 312 mg/dL — ABNORMAL HIGH (ref 70–99)
Potassium: 4.3 mEq/L (ref 3.5–5.1)
Sodium: 138 mEq/L (ref 135–145)

## 2023-06-03 LAB — HEMOGLOBIN A1C: Hgb A1c MFr Bld: 9 % — ABNORMAL HIGH (ref 4.6–6.5)

## 2023-06-03 LAB — TSH: TSH: 0.4 u[IU]/mL (ref 0.35–5.50)

## 2023-06-03 LAB — MICROALBUMIN / CREATININE URINE RATIO
Creatinine,U: 84.1 mg/dL
Microalb Creat Ratio: 18.9 mg/g (ref 0.0–30.0)
Microalb, Ur: 15.9 mg/dL — ABNORMAL HIGH (ref 0.0–1.9)

## 2023-06-03 LAB — LDL CHOLESTEROL, DIRECT: Direct LDL: 98 mg/dL

## 2023-06-03 LAB — ALT: ALT: 16 U/L (ref 0–35)

## 2023-06-05 ENCOUNTER — Encounter: Payer: Self-pay | Admitting: Endocrinology

## 2023-06-05 ENCOUNTER — Ambulatory Visit: Payer: BC Managed Care – PPO | Admitting: Endocrinology

## 2023-06-05 VITALS — BP 122/70 | HR 104 | Ht 59.0 in | Wt 114.6 lb

## 2023-06-05 DIAGNOSIS — E1065 Type 1 diabetes mellitus with hyperglycemia: Secondary | ICD-10-CM

## 2023-06-05 DIAGNOSIS — E7800 Pure hypercholesterolemia, unspecified: Secondary | ICD-10-CM

## 2023-06-05 DIAGNOSIS — I10 Essential (primary) hypertension: Secondary | ICD-10-CM

## 2023-06-05 MED ORDER — FREESTYLE LIBRE 3 SENSOR MISC: 1.0000 | 2 refills | Status: DC

## 2023-06-05 NOTE — Progress Notes (Signed)
Patient ID: Gabrielle Waller, female   DOB: November 06, 1965, 57 y.o.   MRN: [id number]   Reason for Appointment : Follow up for Type 1 Diabetes  History of Present Illness          Diagnosis: Type 1 diabetes mellitus, date of diagnosis: 2001         Past history: Her blood sugar at diagnosis was 1056 and was started on insulin She is typically having difficulty controlling her diabetes because of cost of her medications and compliance She is also having relatively labile blood sugars Has difficulty understanding instructions for day-to-day management for diabetes and although she claims to be compliant with her insulin doses as prescribed she does not often check her blood sugars as directed and not clear how her diet affects her blood sugars   Recent history:   INSULIN regimen is described as: Toujeo 30 units am, 20 units in the evening Novolog insulin mostly 15 units at meals   A1c is 9   Current problems and glucose  patterns:  She continues to take Toujeo However she did not reduce her evening dose to 18 instead of 20 units She appears to be taking her Toujeo at various times in the mornings and sometimes not till 11 AM Unclear whether she has a dawn phenomenon since she tends to have low sugars overnight at times but late morning blood sugars are usually high Overall monitoring is infrequent and mostly after waking up or late in the evening with no consistent pattern Blood sugar patterns are highly inconsistent at all times However the readings she does during the night either near normal or low She has somewhat decreased appetite and has cut down her NovoLog to 15 units but will take 25 for pizza She is not going for walks and not working again   Self-care:  Meals: 2-3 meals per day.  Breakfast 9-10 am; lunch  12pm and dinner 6-7 p.m.    Glucose monitoring:  done  1-2 times a day         Glucometer: One Touch   Blood Glucose readings and averages from review of monitor as  below     PRE-MEAL Midmorning Lunch Dinner Overnight Overall  Glucose range: 101-394   45-142   Mean/median: 223   82 175   POST-MEAL PC Breakfast PC Lunch evening  Glucose range:   61-364  Mean/median:   165   Previously,   PRE-MEAL Fasting Lunch Dinner Overnight Overall  Glucose range: 48-187   42-159 42-509  Mean/median:  438  71 218   POST-MEAL PC Breakfast PC Lunch PC Dinner  Glucose range:   56-428  Mean/median: 298       Dietician visit: Most recent:. ?     She has seen diabetes educator several times in the past      Wt Readings from Last 3 Encounters:  06/05/23 114 lb 9.6 oz (52 kg)  05/06/23 116 lb 6.4 oz (52.8 kg)  03/19/23 116 lb 9.6 oz (52.9 kg)     Lab Results  Component Value Date   HGBA1C 9.0 (H) 06/03/2023   HGBA1C 8.6 (A) 02/25/2023   HGBA1C 8.0 (A) 11/14/2022   Lab Results  Component Value Date   MICROALBUR 15.9 (H) 06/03/2023   LDLCALC 96 11/20/2021   CREATININE 0.96 06/03/2023      Lab Results  Component Value Date   MICRALBCREAT 18.9 06/03/2023   MICRALBCREAT 7.6 11/20/2021     Allergies as of 06/05/2023  Reactions   Penicillins Itching        Medication List        Accurate as of June 05, 2023 11:00 AM. If you have any questions, ask your nurse or doctor.          acetaminophen 325 MG tablet Commonly known as: TYLENOL Take 650 mg by mouth every 6 (six) hours as needed.   aspirin EC 325 MG tablet Take 325 mg by mouth daily.   Baqsimi One Pack 3 MG/DOSE Powd Generic drug: Glucagon Use in nostril for sever low sugar   cetirizine 10 MG tablet Commonly known as: ZYRTEC Take 1 tablet (10 mg total) by mouth daily.   Dexcom G7 Receiver Devi To display CGM data   DULoxetine 60 MG capsule Commonly known as: Cymbalta Take 1 capsule (60 mg total) by mouth daily. For chronic pain   famotidine 20 MG tablet Commonly known as: PEPCID Take 1 tablet (20 mg total) by mouth 2 (two) times daily.   fluticasone 50  MCG/ACT nasal spray Commonly known as: FLONASE Place 2 sprays into both nostrils daily.   FreeStyle Libre 3 Sensor Misc 1 Device by Does not apply route every 14 (fourteen) days. Apply 1 sensor on upper arm every 14 days for continuous glucose monitoring What changed:  when to take this additional instructions Changed by: Reather Littler   gabapentin 300 MG capsule Commonly known as: NEURONTIN Take 1 capsule (300 mg total) by mouth at bedtime.   INSULIN SYRINGE .5CC/31GX5/16" 31G X 5/16" 0.5 ML Misc Use to inject insulin   loratadine 10 MG tablet Commonly known as: CLARITIN Take 1 tablet (10 mg total) by mouth daily.   meloxicam 7.5 MG tablet Commonly known as: Mobic Take 1 tablet (7.5 mg total) by mouth daily as needed for pain.   NovoLOG FlexPen 100 UNIT/ML FlexPen Generic drug: insulin aspart 18-22 units before meals   Omnipod 5 G6 Pods (Gen 5) Misc 1 Device by Does not apply route every 3 (three) days.   OneTouch Delica Lancets 30G Misc 1 each by Does not apply route in the morning, at noon, and at bedtime. Use onetouch delica lancets to check blood sugar 3 times daily.   OneTouch Verio test strip Generic drug: glucose blood USE 1 STRIP TO CHECK GLUCOSE THREE TIMES DAILY   rosuvastatin 40 MG tablet Commonly known as: CRESTOR Take 1 tablet (40 mg total) by mouth daily.   telmisartan 20 MG tablet Commonly known as: MICARDIS Take 1 tablet (20 mg total) by mouth daily.   Toujeo SoloStar 300 UNIT/ML Solostar Pen Generic drug: insulin glargine (1 Unit Dial) Inject into the skin.   traZODone 50 MG tablet Commonly known as: DESYREL Take 1 tablet (50 mg total) by mouth at bedtime as needed for sleep.   Evaristo Bury FlexTouch 200 UNIT/ML FlexTouch Pen Generic drug: insulin degludec Inject 50 Units into the skin daily.   Vitamin D (Ergocalciferol) 1.25 MG (50000 UNIT) Caps capsule Commonly known as: DRISDOL Take 50,000 Units by mouth every 7 (seven) days.         Allergies:  Allergies  Allergen Reactions   Penicillins Itching    Past Medical History:  Diagnosis Date   Diabetes mellitus without complication (HCC)    Kidney stones     Past Surgical History:  Procedure Laterality Date   APPENDECTOMY      Family History  Problem Relation Age of Onset   Diabetes Mother     Social History:  reports  that she has never smoked. She has never used smokeless tobacco. She reports that she does not drink alcohol and does not use drugs.    Review of Systems:   She has a prior history of hyperthyroidism treated with Tapazole in 2001.   TSH level is now back to normal   Lab Results  Component Value Date   FREET4 0.81 11/02/2019   FREET4 0.70 01/26/2019   FREET4 0.69 11/09/2016   TSH 0.40 06/03/2023   TSH 0.28 (L) 05/15/2021   TSH 0.33 (L) 11/02/2019    She has had significant hyperlipidemia at baseline including high triglycerides. she was taking Lipitor 80 mg Has had inconsistent control of LDL  Last LDL is below 100 after switching to Crestor 40 from Lipitor   Lab Results  Component Value Date   CHOL 185 11/20/2021   CHOL 192 01/26/2019   CHOL 155 07/23/2018   Lab Results  Component Value Date   HDL 52.20 11/20/2021   HDL 48.10 01/26/2019   HDL 41.30 07/23/2018   Lab Results  Component Value Date   LDLCALC 96 11/20/2021   LDLCALC 120 (H) 01/26/2019   LDLCALC 93 07/23/2018   Lab Results  Component Value Date   TRIG 185.0 (H) 11/20/2021   TRIG 121.0 01/26/2019   TRIG 104.0 07/23/2018   Lab Results  Component Value Date   CHOLHDL 4 11/20/2021   CHOLHDL 4 01/26/2019   CHOLHDL 4 07/23/2018   Lab Results  Component Value Date   LDLDIRECT 98.0 06/03/2023   LDLDIRECT 98.0 05/28/2022   LDLDIRECT 93.0 05/15/2021    No history of numbness in the feet but has some pains   last exam was done at [facility] in 5/22 with background retinopathy  HYPERTENSION: Generally controlled with Micardis 20 mg  daily  Not checking at home  BP Readings from Last 3 Encounters:  06/05/23 122/70  05/06/23 (!) 148/86  03/19/23 139/82      Physical Examination:  BP 122/70   Pulse (!) 104   Ht 4\' 11"  (1.499 m)   Wt 114 lb 9.6 oz (52 kg)   SpO2 98%   BMI 23.15 kg/m       Diabetic Foot Exam - Simple   Simple Foot Form Diabetic Foot exam was performed with the following findings: Yes   Visual Inspection No deformities, no ulcerations, no other skin breakdown bilaterally: Yes Sensation Testing See comments: Yes Pulse Check Posterior Tibialis and Dorsalis pulse intact bilaterally: Yes Comments Monofilament sensation absent in the left toes and also lateral foot and lower leg      ASSESSMENT/PLAN:   Diabetes type 1:  See history of present illness for detailed discussion of  current management, blood sugar patterns and problems identified  A1c is still high at 9  She is on basal bolus insulin with Toujeo twice a day and regular insulin at meals  Problems identified: Blood sugars are lower overnight as before with some hypoglycemia but appear to be rising in the morning hours but not consistently Unable to verify postprandial readings during the day Blood sugars after dinner or late in the evening are also highly variable with both high and low readings Also she is not able to adjust mealtime dose based on type of meal or carbohydrates usually Lack of insurance coverage for CGM including libre and Dexcom   Recommendations for diabetes: She will try to get the freestyle libre sensor approved through her insurance, message sent to PA team Reduce evening  Toujeo to 18 units She needs to take her morning Toujeo as soon as she wakes up If she is able to get the freestyle Josephine Igo will need to start her on this with training Make sure she takes her NovoLog consistently before eating More frequent glucose monitoring at different times Can have have a bedtime snack also if eating evening  meal early Avoid taking more than 6 ounces of juice for low normal or low sugars  HYPERTENSION: Well controlled with telmisartan 20 mg daily  Hypercholesterolemia: Controlled with LDL below 100  Low back pain, sciatica-like pain and sensory loss in the left lower leg: She needs to talk to her PCP regarding likely lumbar disc prolapse  Total visit time including counseling = 30 minutes  Patient Instructions  Check blood sugars on waking up 5  days a week  Also check blood sugars about 2 hours after meals and do this after different meals by rotation  Recommended blood sugar levels on waking up are 90-130 and about 2 hours after meal is 130-160  Toujeo 18 in pm   Take 30 Toujeo on waking up    Reather Littler 06/05/2023, 11:00 AM

## 2023-06-05 NOTE — Patient Instructions (Addendum)
Check blood sugars on waking up 5  days a week  Also check blood sugars about 2 hours after meals and do this after different meals by rotation  Recommended blood sugar levels on waking up are 90-130 and about 2 hours after meal is 130-160  Toujeo 18 in pm   Take 30 Toujeo on waking up

## 2023-06-10 ENCOUNTER — Telehealth: Payer: Self-pay | Admitting: Pharmacy Technician

## 2023-06-10 ENCOUNTER — Other Ambulatory Visit (HOSPITAL_COMMUNITY): Payer: Self-pay

## 2023-06-10 NOTE — Telephone Encounter (Signed)
-----   Message from Reather Littler sent at 06/05/2023 10:44 AM EDT ----- Regarding: PA for freestyle libre Please process PA for freestyle libre sensor for her type 1 diabetes and persistently poor control, A1c 9%, thanks

## 2023-06-10 NOTE — Telephone Encounter (Signed)
Pharmacy Patient Advocate Encounter   Received notification from  Staff [facility]  that prior authorization for [facility] 3 is required/requested.   Insurance verification completed.   The patient is insured through [place]   .   Per test claim:  Dexcom is preferred by the ins.  If suggested medication is appropriate, Please send in a new RX and discontinue this one. If not, please advise as to why it's not appropriate so that we may request a Prior Authorization.  Copay for Dexcom is $92.17 per 30 days.

## 2023-06-10 NOTE — Telephone Encounter (Signed)
LMTRC  JMiller,RMA 

## 2023-06-11 ENCOUNTER — Ambulatory Visit: Payer: BC Managed Care – PPO | Admitting: Endocrinology

## 2023-06-11 NOTE — Telephone Encounter (Signed)
Patient needs to speak with her spouse and she will give [place] a callback

## 2023-06-14 ENCOUNTER — Other Ambulatory Visit: Payer: Self-pay | Admitting: Family Medicine

## 2023-06-14 DIAGNOSIS — E7800 Pure hypercholesterolemia, unspecified: Secondary | ICD-10-CM

## 2023-07-07 ENCOUNTER — Other Ambulatory Visit: Payer: Self-pay | Admitting: Physician Assistant

## 2023-07-12 ENCOUNTER — Other Ambulatory Visit: Payer: Self-pay

## 2023-07-12 ENCOUNTER — Encounter (HOSPITAL_COMMUNITY): Payer: Self-pay

## 2023-07-12 ENCOUNTER — Ambulatory Visit
Admission: EM | Admit: 2023-07-12 | Discharge: 2023-07-12 | Disposition: A | Discharge status: Home / Self Care | Payer: BLUE CROSS/BLUE SHIELD

## 2023-07-12 ENCOUNTER — Emergency Department (HOSPITAL_COMMUNITY): Payer: BLUE CROSS/BLUE SHIELD

## 2023-07-12 ENCOUNTER — Emergency Department (HOSPITAL_COMMUNITY)
Admission: EM | Admit: 2023-07-12 | Discharge: 2023-07-12 | Disposition: A | Discharge status: Home / Self Care | Payer: BLUE CROSS/BLUE SHIELD | Attending: Emergency Medicine | Admitting: Emergency Medicine

## 2023-07-12 DIAGNOSIS — Z20822 Contact with and (suspected) exposure to covid-19: Secondary | ICD-10-CM | POA: Insufficient documentation

## 2023-07-12 DIAGNOSIS — R200 Anesthesia of skin: Secondary | ICD-10-CM | POA: Diagnosis not present

## 2023-07-12 DIAGNOSIS — R053 Chronic cough: Secondary | ICD-10-CM | POA: Diagnosis not present

## 2023-07-12 DIAGNOSIS — J069 Acute upper respiratory infection, unspecified: Secondary | ICD-10-CM | POA: Diagnosis not present

## 2023-07-12 DIAGNOSIS — R0602 Shortness of breath: Secondary | ICD-10-CM | POA: Insufficient documentation

## 2023-07-12 DIAGNOSIS — Z794 Long term (current) use of insulin: Secondary | ICD-10-CM | POA: Insufficient documentation

## 2023-07-12 DIAGNOSIS — E1065 Type 1 diabetes mellitus with hyperglycemia: Secondary | ICD-10-CM | POA: Insufficient documentation

## 2023-07-12 DIAGNOSIS — R052 Subacute cough: Secondary | ICD-10-CM | POA: Diagnosis not present

## 2023-07-12 DIAGNOSIS — Z79899 Other long term (current) drug therapy: Secondary | ICD-10-CM | POA: Diagnosis not present

## 2023-07-12 DIAGNOSIS — Z7982 Long term (current) use of aspirin: Secondary | ICD-10-CM | POA: Insufficient documentation

## 2023-07-12 DIAGNOSIS — I10 Essential (primary) hypertension: Secondary | ICD-10-CM | POA: Insufficient documentation

## 2023-07-12 DIAGNOSIS — R739 Hyperglycemia, unspecified: Secondary | ICD-10-CM

## 2023-07-12 DIAGNOSIS — R479 Unspecified speech disturbances: Secondary | ICD-10-CM

## 2023-07-12 LAB — COMPREHENSIVE METABOLIC PANEL
ALT: 67 U/L — ABNORMAL HIGH (ref 0–44)
AST: 96 U/L — ABNORMAL HIGH (ref 15–41)
Albumin: 3.2 g/dL — ABNORMAL LOW (ref 3.5–5.0)
Alkaline Phosphatase: 118 U/L (ref 38–126)
Anion gap: 20 — ABNORMAL HIGH (ref 5–15)
BUN: 22 mg/dL — ABNORMAL HIGH (ref 6–20)
CO2: 14 mmol/L — ABNORMAL LOW (ref 22–32)
Calcium: 9.2 mg/dL (ref 8.9–10.3)
Chloride: 105 mmol/L (ref 98–111)
Creatinine, Ser: 1.33 mg/dL — ABNORMAL HIGH (ref 0.44–1.00)
GFR, Estimated: 47 mL/min — ABNORMAL LOW (ref >60)
Glucose, Bld: 200 mg/dL — ABNORMAL HIGH (ref 70–99)
Potassium: 4 mmol/L (ref 3.5–5.1)
Sodium: 139 mmol/L (ref 135–145)
Total Bilirubin: 0.3 mg/dL (ref 0.3–1.2)
Total Protein: 6.3 g/dL — ABNORMAL LOW (ref 6.5–8.1)

## 2023-07-12 LAB — BLOOD GAS, VENOUS
Acid-Base Excess: 0.5 mmol/L (ref 0.0–2.0)
Bicarbonate: 24.6 mmol/L (ref 20.0–28.0)
O2 Saturation: 99 %
Patient temperature: 37
pCO2, Ven: 37 mmHg — ABNORMAL LOW (ref 44–60)
pH, Ven: 7.43 (ref 7.25–7.43)
pO2, Ven: 81 mmHg — ABNORMAL HIGH (ref 32–45)

## 2023-07-12 LAB — TROPONIN I (HIGH SENSITIVITY)
Troponin I (High Sensitivity): 5 ng/L (ref <18)
Troponin I (High Sensitivity): 6 ng/L (ref <18)

## 2023-07-12 LAB — CBC
HCT: 36 % (ref 36.0–46.0)
Hemoglobin: 11.4 g/dL — ABNORMAL LOW (ref 12.0–15.0)
MCH: 30.8 pg (ref 26.0–34.0)
MCHC: 31.7 g/dL (ref 30.0–36.0)
MCV: 97.3 fL (ref 80.0–100.0)
Platelets: 287 10*3/uL (ref 150–400)
RBC: 3.7 MIL/uL — ABNORMAL LOW (ref 3.87–5.11)
RDW: 15 % (ref 11.5–15.5)
WBC: 6 10*3/uL (ref 4.0–10.5)
nRBC: 0 % (ref 0.0–0.2)

## 2023-07-12 LAB — RESP PANEL BY RT-PCR (RSV, FLU A&B, COVID)  RVPGX2
Influenza A by PCR: NEGATIVE
Influenza B by PCR: NEGATIVE
Resp Syncytial Virus by PCR: NEGATIVE
SARS Coronavirus 2 by RT PCR: NEGATIVE

## 2023-07-12 LAB — BASIC METABOLIC PANEL
Anion gap: 12 (ref 5–15)
BUN: 21 mg/dL — ABNORMAL HIGH (ref 6–20)
CO2: 24 mmol/L (ref 22–32)
Calcium: 9.3 mg/dL (ref 8.9–10.3)
Chloride: 107 mmol/L (ref 98–111)
Creatinine, Ser: 1.01 mg/dL — ABNORMAL HIGH (ref 0.44–1.00)
GFR, Estimated: >60 mL/min (ref >60)
Glucose, Bld: 45 mg/dL — ABNORMAL LOW (ref 70–99)
Potassium: 3.6 mmol/L (ref 3.5–5.1)
Sodium: 143 mmol/L (ref 135–145)

## 2023-07-12 LAB — CBG MONITORING, ED
Glucose-Capillary: 34 mg/dL — CL (ref 70–99)
Glucose-Capillary: 188 mg/dL — ABNORMAL HIGH (ref 70–99)

## 2023-07-12 MED ORDER — DEXTROSE 50 % IV SOLN
INTRAVENOUS | Status: AC
  Administered 2023-07-12: 50 mL
  Filled 2023-07-12: qty 50

## 2023-07-12 MED ORDER — LACTATED RINGERS IV BOLUS
1000.0000 mL | Freq: Once | INTRAVENOUS | Status: AC
  Administered 2023-07-12: 1000 mL via INTRAVENOUS

## 2023-07-12 MED ORDER — BENZONATATE 100 MG PO CAPS: 100.0000 mg | ORAL_CAPSULE | Freq: Three times a day (TID) | ORAL | 0 refills | Status: DC

## 2023-07-12 NOTE — ED Notes (Signed)
Patient is being discharged from the Urgent Care and sent to the Emergency Department via private vehicle . Per private vehicle, patient is in need of higher level of care due to R/O TIA. Patient is aware and verbalizes understanding of plan of care.  Vitals:   07/12/23 1331  BP: 133/83  Pulse: 60  Resp: 20  Temp: 97.9 F (36.6 C)  SpO2: 97%

## 2023-07-12 NOTE — ED Notes (Signed)
Pt reported to provider Gabrielle Waller that yesterday she had facial numbness and difficulty speaking.  Pt sent to the ED.

## 2023-07-12 NOTE — ED Triage Notes (Signed)
Pt states cough,sore throat, runny nose for the past  3 weeks.

## 2023-07-12 NOTE — ED Provider Notes (Addendum)
[facility]    CSN: [id number] Arrival date & time: 07/12/23  1241      History   Chief Complaint Chief Complaint  Patient presents with   Cough    HPI Gabrielle Waller is a 57 y.o. female.   Patient's chief complaint is 3-week history of cough, sore throat, runny nose.  Patient reports this is the first time she has been evaluated for this.  Denies any fever.  She has not taken any medications for symptoms.  Denies history of asthma or COPD and patient does not smoke cigarettes.  Patient also mentions that approximately 1 night ago she had an episode of left-sided facial numbness and difficulty speaking.  She is not sure how long it lasted but states it is now resolved.  Denies any other associated symptoms with it other than some blurry vision.  Denies headache, chest pain, nausea, vomiting, shortness of breath.   Cough   Past Medical History:  Diagnosis Date   Diabetes mellitus without complication (HCC)    Kidney stones     Patient Active Problem List   Diagnosis Date Noted   Allergic rhinitis 03/08/2016   Health care maintenance 03/08/2016   Elevated blood pressure 03/08/2016   Subclinical hyperthyroidism 07/16/2013   Mixed hyperlipidemia 07/14/2013   Type 1 diabetes mellitus (HCC) 07/10/2013    Past Surgical History:  Procedure Laterality Date   APPENDECTOMY      OB History   No obstetric history on file.      Home Medications    Prior to Admission medications   Medication Sig Start Date End Date Taking? Authorizing Provider  acetaminophen (TYLENOL) 325 MG tablet Take 650 mg by mouth every 6 (six) hours as needed.    [provider]  aspirin 325 MG EC tablet Take 325 mg by mouth daily.    [provider]  cetirizine (ZYRTEC) 10 MG tablet Take 1 tablet (10 mg total) by mouth daily. 07/19/22   Hoy Register, MD  Continuous Glucose Receiver (DEXCOM G7 RECEIVER) DEVI To display CGM data Patient not taking: Reported on 06/05/2023  03/06/23   Reather Littler, MD  Continuous Glucose Sensor (FREESTYLE LIBRE 3 SENSOR) MISC 1 Device by Does not apply route every 14 (fourteen) days. Apply 1 sensor on upper arm every 14 days for continuous glucose monitoring 06/05/23   Reather Littler, MD  DULoxetine (CYMBALTA) 60 MG capsule Take 1 capsule (60 mg total) by mouth daily. For chronic pain 03/19/23   Hoy Register, MD  famotidine (PEPCID) 20 MG tablet Take 1 tablet (20 mg total) by mouth 2 (two) times daily. 10/03/20   Elvina Sidle, MD  fluticasone (FLONASE) 50 MCG/ACT nasal spray Place 2 sprays into both nostrils daily. 07/19/22   Hoy Register, MD  gabapentin (NEURONTIN) 300 MG capsule Take 1 capsule (300 mg total) by mouth at bedtime. 01/17/23   Hoy Register, MD  Glucagon (BAQSIMI ONE PACK) 3 MG/DOSE POWD Use in nostril for sever low sugar 05/22/21   Reather Littler, MD  glucose blood [facility] VERIO) test strip USE 1 STRIP TO CHECK GLUCOSE THREE TIMES DAILY 02/12/23   Reather Littler, MD  insulin aspart (NOVOLOG FLEXPEN) 100 UNIT/ML FlexPen 18-22 units before meals 02/25/23   Reather Littler, MD  insulin degludec (TRESIBA FLEXTOUCH) 200 UNIT/ML FlexTouch Pen Inject 50 Units into the skin daily. Patient not taking: Reported on 06/05/2023 02/25/23   Reather Littler, MD  Insulin Disposable Pump (OMNIPOD 5 G6 POD, GEN 5,) MISC 1 Device by Does  not apply route every 3 (three) days. Patient not taking: Reported on 06/05/2023 09/03/22   Reather Littler, MD  Insulin Syringe-Needle U-100 (INSULIN SYRINGE .5CC/31GX5/16") 31G X 5/16" 0.5 ML MISC Use to inject insulin 08/21/21   Reather Littler, MD  loratadine (CLARITIN) 10 MG tablet Take 1 tablet (10 mg total) by mouth daily. 03/08/16   Veryl Speak, FNP  meloxicam (MOBIC) 7.5 MG tablet Take 1 tablet (7.5 mg total) by mouth daily as needed for pain. 06/05/21   Vivi Barrack, DPM  OneTouch Delica Lancets 30G MISC 1 each by Does not apply route in the morning, at noon, and at bedtime. Use onetouch delica lancets to check  blood sugar 3 times daily. 02/04/20   Reather Littler, MD  rosuvastatin (CRESTOR) 40 MG tablet TAKE 1 TABLET BY MOUTH EVERY DAY 06/14/23   Hoy Register, MD  telmisartan (MICARDIS) 20 MG tablet Take 1 tablet (20 mg total) by mouth daily. 01/17/23   Hoy Register, MD  TOUJEO SOLOSTAR 300 UNIT/ML Solostar Pen Inject into the skin. 05/25/23   [provider]  traZODone (DESYREL) 50 MG tablet Take 1 tablet (50 mg total) by mouth at bedtime as needed for sleep. 01/17/23   Hoy Register, MD  Vitamin D, Ergocalciferol, (DRISDOL) 50000 units CAPS capsule Take 50,000 Units by mouth every 7 (seven) days.    [provider]    Family History Family History  Problem Relation Age of Onset   Diabetes Mother     Social History Social History   Tobacco Use   Smoking status: Never   Smokeless tobacco: Never  Vaping Use   Vaping status: Never Used  Substance Use Topics   Alcohol use: No   Drug use: No     Allergies   Penicillins   Review of Systems Review of Systems Per HPI  Physical Exam Triage Vital Signs ED Triage Vitals  Encounter Vitals Group     BP 07/12/23 1331 133/83     Systolic BP Percentile --      Diastolic BP Percentile --      Pulse Rate 07/12/23 1331 60     Resp 07/12/23 1331 20     Temp 07/12/23 1331 97.9 F (36.6 C)     Temp Source 07/12/23 1331 Oral     SpO2 07/12/23 1331 97 %     Weight --      Height --      Head Circumference --      Peak Flow --      Pain Score 07/12/23 1336 0     Pain Loc --      Pain Education --      Exclude from Growth Chart --    No data found.  Updated Vital Signs BP 133/83 (BP Location: Left Arm)   Pulse 60   Temp 97.9 F (36.6 C) (Oral)   Resp 20   SpO2 97%   Visual Acuity Right Eye Distance:   Left Eye Distance:   Bilateral Distance:    Right Eye Near:   Left Eye Near:    Bilateral Near:     Physical Exam Constitutional:      General: She is not in acute distress.    Appearance: Normal  appearance. She is not toxic-appearing or diaphoretic.  HENT:     Head: Normocephalic and atraumatic.     Right Ear: Tympanic membrane and ear canal normal.     Left Ear: Tympanic membrane and ear canal normal.  Nose: Congestion present.     Mouth/Throat:     Mouth: Mucous membranes are moist.     Pharynx: No posterior oropharyngeal erythema.  Eyes:     Extraocular Movements: Extraocular movements intact.     Conjunctiva/sclera: Conjunctivae normal.     Pupils: Pupils are equal, round, and reactive to light.  Cardiovascular:     Rate and Rhythm: Normal rate and regular rhythm.     Pulses: Normal pulses.     Heart sounds: Normal heart sounds.  Pulmonary:     Effort: Pulmonary effort is normal. No respiratory distress.     Breath sounds: Normal breath sounds. No wheezing.  Abdominal:     General: Abdomen is flat. Bowel sounds are normal.     Palpations: Abdomen is soft.  Musculoskeletal:        General: Normal range of motion.     Cervical back: Normal range of motion.  Skin:    General: Skin is warm and dry.  Neurological:     General: No focal deficit present.     Mental Status: She is alert and oriented to person, place, and time. Mental status is at baseline.     Cranial Nerves: Cranial nerves 2-12 are intact.     Sensory: Sensation is intact.     Motor: Motor function is intact.     Coordination: Coordination is intact.     Gait: Gait is intact.  Psychiatric:        Mood and Affect: Mood normal.        Behavior: Behavior normal.      UC Treatments / Results  Labs (all labs ordered are listed, but only abnormal results are displayed) Labs Reviewed - No data to display  EKG   Radiology No results found.  Procedures Procedures (including critical care time)  Medications Ordered in UC Medications - No data to display  Initial Impression / Assessment and Plan / UC Course  I have reviewed the triage vital signs and the nursing notes.  Pertinent labs &  imaging results that were available during my care of the patient were reviewed by me and considered in my medical decision making (see chart for details).     I am very concerned that patient's facial numbness and difficulty speaking that was temporary could be related to TIA. Therefore,  Will defer any further evaluation and treatment for upper respiratory symptoms and cough to the ED given priority of facial numbness and difficulty speaking as I do think that more extensive evaluation is necessary for this as patient may need imaging of the head.  It took patient prolonged amount of time to tell me what year it was which is also concerning.  Advised patient's son who is present in exam room to take her straight to the emergency department and they were agreeable with plan.  Vital signs and neuroexam stable at discharge.  Agree with the patient's son transporting her to the ER.  Patient wished for her son to interpret throughout patient interaction. Final Clinical Impressions(s) / UC Diagnoses   Final diagnoses:  Facial numbness  Difficulty speaking  Acute upper respiratory infection  Persistent cough   Discharge Instructions   None    ED Prescriptions   None    PDMP not reviewed this encounter.   Gustavus Bryant, [place] 07/12/23 1355    Gustavus Bryant, [place] 07/12/23 1356

## 2023-07-12 NOTE — ED Notes (Signed)
Pt is a&ox4, warm and dry to touch. Pt is attached to monitor/vitals. Pt complains of a slight headache. Pt denies cp. Side rails up x 2, call light with patient, warm blanket provided. Family at the bedside.

## 2023-07-12 NOTE — Discharge Instructions (Addendum)
You were seen in the emergency room for cough, I have sent a medication to your pharmacy for your cough - please take as needed.  Follow up with endocrinology for high blood sugar. Follow up with primary care to ensure resolution of cough.  Return to the ER with new or worsening symptoms.

## 2023-07-12 NOTE — ED Notes (Signed)
I  used interpreter to triage pt

## 2023-07-12 NOTE — ED Triage Notes (Signed)
Pt was choking on saliva while sleeping and woke up SOB a few days ago. Pt states she was eating and started choking on her food a couple of weeks ago. Pt c/o dry throat and greenish mucous from throat.

## 2023-07-12 NOTE — ED Provider Notes (Signed)
[name] EMERGENCY DEPARTMENT AT [facility] Provider Note   CSN: [id number] Arrival date & time: 07/12/23  1543     History  Chief Complaint  Patient presents with  . Shortness of Breath    Gabrielle Waller is a 57 y.o. female with past medical history of type 1 diabetes and hypertension presenting for ongoing productive cough and sore throat x 3 weeks, patient also reports that one night ago she experienced trouble swallowing after coughing fit and her face felt weird, that resolved within a few minutes. Pts BG has been elevated, with the highest being 400s for about a month, she has been taking her insulin, however, reports her PCP recently retired so she has not seen her new provider yet. Denies recent falls, not on BT. Denies SOB, CP, NVD. Denies smoking, EtOH, or drugs.    Shortness of Breath      Home Medications Prior to Admission medications   Medication Sig Start Date End Date Taking? Authorizing Provider  acetaminophen (TYLENOL) 325 MG tablet Take 650 mg by mouth every 6 (six) hours as needed.    [provider]  aspirin 325 MG EC tablet Take 325 mg by mouth daily.    [provider]  cetirizine (ZYRTEC) 10 MG tablet Take 1 tablet (10 mg total) by mouth daily. 07/19/22   Hoy Register, MD  Continuous Glucose Receiver (DEXCOM G7 RECEIVER) DEVI To display CGM data Patient not taking: Reported on 06/05/2023 03/06/23   Reather Littler, MD  Continuous Glucose Sensor (FREESTYLE LIBRE 3 SENSOR) MISC 1 Device by Does not apply route every 14 (fourteen) days. Apply 1 sensor on upper arm every 14 days for continuous glucose monitoring 06/05/23   Reather Littler, MD  DULoxetine (CYMBALTA) 60 MG capsule Take 1 capsule (60 mg total) by mouth daily. For chronic pain 03/19/23   Hoy Register, MD  famotidine (PEPCID) 20 MG tablet Take 1 tablet (20 mg total) by mouth 2 (two) times daily. 10/03/20   Elvina Sidle, MD  fluticasone (FLONASE) 50 MCG/ACT nasal spray Place 2  sprays into both nostrils daily. 07/19/22   Hoy Register, MD  gabapentin (NEURONTIN) 300 MG capsule Take 1 capsule (300 mg total) by mouth at bedtime. 01/17/23   Hoy Register, MD  Glucagon (BAQSIMI ONE PACK) 3 MG/DOSE POWD Use in nostril for sever low sugar 05/22/21   Reather Littler, MD  glucose blood [facility] VERIO) test strip USE 1 STRIP TO CHECK GLUCOSE THREE TIMES DAILY 02/12/23   Reather Littler, MD  insulin aspart (NOVOLOG FLEXPEN) 100 UNIT/ML FlexPen 18-22 units before meals 02/25/23   Reather Littler, MD  insulin degludec (TRESIBA FLEXTOUCH) 200 UNIT/ML FlexTouch Pen Inject 50 Units into the skin daily. Patient not taking: Reported on 06/05/2023 02/25/23   Reather Littler, MD  Insulin Disposable Pump (OMNIPOD 5 G6 POD, GEN 5,) MISC 1 Device by Does not apply route every 3 (three) days. Patient not taking: Reported on 06/05/2023 09/03/22   Reather Littler, MD  Insulin Syringe-Needle U-100 (INSULIN SYRINGE .5CC/31GX5/16") 31G X 5/16" 0.5 ML MISC Use to inject insulin 08/21/21   Reather Littler, MD  loratadine (CLARITIN) 10 MG tablet Take 1 tablet (10 mg total) by mouth daily. 03/08/16   Veryl Speak, FNP  meloxicam (MOBIC) 7.5 MG tablet Take 1 tablet (7.5 mg total) by mouth daily as needed for pain. 06/05/21   Vivi Barrack, DPM  OneTouch Delica Lancets 30G MISC 1 each by Does not apply route in the morning, at  noon, and at bedtime. Use onetouch delica lancets to check blood sugar 3 times daily. 02/04/20   Reather Littler, MD  rosuvastatin (CRESTOR) 40 MG tablet TAKE 1 TABLET BY MOUTH EVERY DAY 06/14/23   Hoy Register, MD  telmisartan (MICARDIS) 20 MG tablet Take 1 tablet (20 mg total) by mouth daily. 01/17/23   Hoy Register, MD  TOUJEO SOLOSTAR 300 UNIT/ML Solostar Pen Inject into the skin. 05/25/23   [provider]  traZODone (DESYREL) 50 MG tablet Take 1 tablet (50 mg total) by mouth at bedtime as needed for sleep. 01/17/23   Hoy Register, MD  Vitamin D, Ergocalciferol, (DRISDOL) 50000 units CAPS  capsule Take 50,000 Units by mouth every 7 (seven) days.    [provider]      Allergies    Penicillins    Review of Systems   Review of Systems  Respiratory:  Positive for shortness of breath.     Physical Exam Updated Vital Signs BP 132/78   Pulse 90   Temp 98.5 F (36.9 C) (Oral)   Resp 17   Ht 4\' 11"  (1.499 m)   Wt 52 kg   SpO2 98%   BMI 23.15 kg/m  Physical Exam Vitals and nursing note reviewed.  Constitutional:      General: She is not in acute distress.    Appearance: She is not toxic-appearing.  HENT:     Head: Normocephalic and atraumatic.  Eyes:     General: No scleral icterus.    Conjunctiva/sclera: Conjunctivae normal.  Cardiovascular:     Rate and Rhythm: Normal rate and regular rhythm.     Pulses: Normal pulses.     Heart sounds: Normal heart sounds.  Pulmonary:     Effort: Pulmonary effort is normal. No respiratory distress.     Breath sounds: Normal breath sounds.  Abdominal:     General: Abdomen is flat. Bowel sounds are normal.     Palpations: Abdomen is soft.     Tenderness: There is no abdominal tenderness.  Skin:    General: Skin is warm and dry.     Findings: No lesion.  Neurological:     General: No focal deficit present.     Mental Status: She is alert and oriented to person, place, and time. Mental status is at baseline.     ED Results / Procedures / Treatments   Labs (all labs ordered are listed, but only abnormal results are displayed) Labs Reviewed  COMPREHENSIVE METABOLIC PANEL - Abnormal; Notable for the following components:      Result Value   CO2 14 (*)    Glucose, Bld 200 (*)    BUN 22 (*)    Creatinine, Ser 1.33 (*)    Total Protein 6.3 (*)    Albumin 3.2 (*)    AST 96 (*)    ALT 67 (*)    GFR, Estimated 47 (*)    Anion gap 20 (*)    All other components within normal limits  CBC - Abnormal; Notable for the following components:   RBC 3.70 (*)    Hemoglobin 11.4 (*)    All other components within  normal limits  RESP PANEL BY RT-PCR (RSV, FLU A&B, COVID)  [id number]    EKG None  Radiology DG Chest 1 View  Result Date: 07/12/2023 CLINICAL DATA:  Shortness of breath. Patient states she was choking on saliva in her sleep. EXAM: CHEST  1 VIEW COMPARISON:  Two-view chest x-ray 01/13/2009 FINDINGS: The heart  size is normal. No edema or effusion is present. No focal airspace disease is present. Surgical clips are present the gallbladder fossa. Mild rightward curvature is present in the thoracic spine. IMPRESSION: No acute cardiopulmonary disease. Electronically Signed   By: Marin Roberts M.D.   On: 07/12/2023 18:10    Procedures Procedures    Medications Ordered in ED Medications - No data to display  ED Course/ Medical Decision Making/ A&P Clinical Course as of 07/12/23 2218  [facility] Jul 12, 2023  2127 Pt bg 34, given d50 amp, pt not endorsing sx, will re-assess  [JB]  2216 Pt re-assessed, reporting she is feeling very well. No change in sx. [JB]    Clinical Course User Index [JB] Kellianne Ek, Horald Chestnut, PA-C                                 Medical Decision Making Amount and/or Complexity of Data Reviewed Labs: ordered. Radiology: ordered.   Gabrielle Waller 57 y.o. presented today for cough and TIA like sx. Working DDx that I considered at this time includes, but not limited to, CVA/TIA, arrhythmia, vertigo, medication s/e, orthostatic hypotension, electrolyte abnormalities, dehydration, URI, ACS, UTI, anemia   R/o DDx: These are considered less likely than current impression due to history of present illness, physical exam, lab/imaging findings   Review of prior external notes: UC visit today  Pmhx: DM1, HTN, HLD  Unique Tests and My Interpretation:  CBC: hgb 11.4, no wbc count CMP: CO2 14, glucoose 200, anion gap 20 -- VBG ordered. BUN 22 Cr 1.33, AST and ALT mildly elevated - patient is not endorsing abd pain, she is not a etoh drinker, takes statin  Repeating BMP: anion gap  12, BUN & Cr improved  VBG: CO2 37, O2 81 Trop: Neg, repeat Trop: NEG Rep panel: NEG  EKG: Rate, rhythm, axis, intervals all examined: sinus tachycardia w/ PVC  Imaging:  CXR: No acute cardiopulmonary findings  CT of head: No acute pathology    Problem List / ED Course / Critical interventions / Medication management  Patient presenting for 3 weeks of cough productive of green sputum.  Although patient reports shortness of breath upon arrival to ER she is denying shortness of breath in the room after speaking to her.  However last night she had temporary left-sided facial weakness and dysphagia that resolved after an unknown amount of time.  Patient does report she has had difficulty with high blood sugars the past month or so with average being as high as 400.  Chest x-ray with no sign of pneumonia, respiratory panel in ER was negative.  Patient was found to have anion gap of 20, bg of 200 - ordered 1L bolus and vbg.  I ordered medication including LR  for hyperglycemia   After 1 L pt BG 37, given d50 amp & crackers, will not given second litter, awaiting Trop and BMP  Reevaluation of the patient after these medicines showed that the patient stayed the same Patients vitals assessed. Upon arrival patient is hemodynamically stable.  I have reviewed the patients home medicines and have made adjustments as needed  Consult: None  Plan: Sent Tessalon for cough.  Pt reports endo apt on [place], f/u w/ endo for poor BG control. F/u w/ PCP in 2-3d to ensure resolution of sx.  Patient was given return precautions. Patient stable for discharge at this time.  Patient educated on current  sx/dx and verbalized understanding of plan. Return to ER w/ new or worsening sx.           Final Clinical Impression(s) / ED Diagnoses Final diagnoses:  None    Rx / DC Orders ED Discharge Orders     None         Raford Pitcher Evalee Jefferson 07/12/23 2218    Rondel Baton, MD 07/13/23  1043

## 2023-07-13 ENCOUNTER — Other Ambulatory Visit: Payer: Self-pay | Admitting: Family Medicine

## 2023-07-13 DIAGNOSIS — E1042 Type 1 diabetes mellitus with diabetic polyneuropathy: Secondary | ICD-10-CM

## 2023-07-13 DIAGNOSIS — E1159 Type 2 diabetes mellitus with other circulatory complications: Secondary | ICD-10-CM

## 2023-07-13 DIAGNOSIS — G4709 Other insomnia: Secondary | ICD-10-CM

## 2023-07-14 ENCOUNTER — Other Ambulatory Visit: Payer: Self-pay | Admitting: Physician Assistant

## 2023-07-17 ENCOUNTER — Ambulatory Visit: Payer: BLUE CROSS/BLUE SHIELD | Attending: Family Medicine | Admitting: Family Medicine

## 2023-07-17 ENCOUNTER — Telehealth: Payer: Self-pay | Admitting: Endocrinology

## 2023-07-17 VITALS — BP 132/77 | HR 67 | Ht 59.0 in | Wt 113.8 lb

## 2023-07-17 DIAGNOSIS — Z23 Encounter for immunization: Secondary | ICD-10-CM | POA: Diagnosis not present

## 2023-07-17 DIAGNOSIS — M79652 Pain in left thigh: Secondary | ICD-10-CM | POA: Diagnosis not present

## 2023-07-17 DIAGNOSIS — G4709 Other insomnia: Secondary | ICD-10-CM

## 2023-07-17 DIAGNOSIS — R058 Other specified cough: Secondary | ICD-10-CM

## 2023-07-17 DIAGNOSIS — Z794 Long term (current) use of insulin: Secondary | ICD-10-CM

## 2023-07-17 DIAGNOSIS — E7800 Pure hypercholesterolemia, unspecified: Secondary | ICD-10-CM

## 2023-07-17 DIAGNOSIS — E1042 Type 1 diabetes mellitus with diabetic polyneuropathy: Secondary | ICD-10-CM

## 2023-07-17 DIAGNOSIS — Z1211 Encounter for screening for malignant neoplasm of colon: Secondary | ICD-10-CM

## 2023-07-17 DIAGNOSIS — E1159 Type 2 diabetes mellitus with other circulatory complications: Secondary | ICD-10-CM

## 2023-07-17 DIAGNOSIS — E1069 Type 1 diabetes mellitus with other specified complication: Secondary | ICD-10-CM

## 2023-07-17 DIAGNOSIS — I152 Hypertension secondary to endocrine disorders: Secondary | ICD-10-CM

## 2023-07-17 MED ORDER — DULOXETINE HCL 60 MG PO CPEP
60.0000 mg | ORAL_CAPSULE | Freq: Every day | ORAL | 1 refills | Status: DC
Start: 2023-07-17 — End: 2024-01-06

## 2023-07-17 MED ORDER — ROSUVASTATIN CALCIUM 40 MG PO TABS
40.0000 mg | ORAL_TABLET | Freq: Every day | ORAL | 1 refills | Status: DC
Start: 2023-07-17 — End: 2024-01-06

## 2023-07-17 MED ORDER — NOVOLOG FLEXPEN 100 UNIT/ML ~~LOC~~ SOPN: PEN_INJECTOR | SUBCUTANEOUS | 1 refills | Status: DC

## 2023-07-17 MED ORDER — GABAPENTIN 300 MG PO CAPS
300.0000 mg | ORAL_CAPSULE | Freq: Every day | ORAL | 1 refills | Status: DC
Start: 2023-07-17 — End: 2024-01-06

## 2023-07-17 MED ORDER — TELMISARTAN 20 MG PO TABS
20.0000 mg | ORAL_TABLET | Freq: Every day | ORAL | 1 refills | Status: DC
Start: 2023-07-17 — End: 2023-10-15

## 2023-07-17 MED ORDER — TELMISARTAN 40 MG PO TABS
40.0000 mg | ORAL_TABLET | Freq: Every day | ORAL | 1 refills | Status: DC
Start: 2023-07-17 — End: 2023-07-17

## 2023-07-17 MED ORDER — PREDNISONE 20 MG PO TABS: 20.0000 mg | ORAL_TABLET | Freq: Every day | ORAL | 0 refills | Status: AC

## 2023-07-17 NOTE — Patient Instructions (Signed)
Ronquera Hoarseness  La ronquera, tambin denominada disfona, es todo cambio anormal en la voz que pueda dificultar el habla. La voz puede sonar spera, entrecortada o forzada. La ronquera es causada por un problema con las cuerdas vocales. Son dos bandas de tejido que se encuentran dentro de la laringe. Al hablar, las cuerdas vocales se mueven hacia atrs y hacia adelante para crear sonidos. Para que la voz sea clara, las superficies de estas cuerdas deben ser suaves. La hinchazn o la presencia de ndulos en las cuerdas vocales pueden causar ronquera. Los problemas de las cuerdas vocales pueden ser el resultado de lesiones o crecimientos anormales, ciertas enfermedades, infeccin de las vas respiratorias superiores o Environmental consultant. Otras causas pueden incluir efectos secundarios de los medicamentos y exposicin a sustancias irritantes. Siga estas indicaciones en su casa:  Est atento a cualquier cambio en los sntomas. Para mantener su seguridad y ayudar a Paramedic sus sntomas, tome estas medidas: Estilo de vida No ingiera alimentos que le causen [employer], por [employer] alimentos condimentados o cidos como el pimiento picante y el jugo de [place]. Estos alimentos pueden causar un reflujo gastroesofgico que puede [employer] problemas de las cuerdas vocales. Limite la cantidad de alcohol y cafena que consume como se lo haya indicado el mdico. Beba suficiente lquido como para Pharmacologist la orina de color amarillo plido. No consuma ningn producto que contenga nicotina o tabaco. Estos productos incluyen cigarrillos, tabaco para Theatre manager y aparatos de vapeo, como los Administrator, Civil Service. Si necesita ayuda para dejar de consumir estos productos, consulte al [employer]. Evite ser fumador pasivo. Indicaciones generales Use un humidificador si el aire de su casa es seco. Evite la tos o la carraspera. No susurre. Susurrar puede causar esfuerzo muscular. No hable en voz alta ni con voz  spera. Ponga la voz en reposo. Si el mdico se lo recomienda, programe una cita con un especialista en lenguaje y habla. Es posible que Advertising copywriter brinde mtodos que puedan ayudarlo a Automotive engineer el uso indebido de la voz. Comunquese con un mdico si: Tiene la voz ronca durante ms de [employer]. Est casi o totalmente afnico durante ms de 3 das. Siente dolor al tragar o cuando trata de hablar. Se palpa un bulto en el cuello. Solicite ayuda de inmediato si: Tiene dificultad para tragar. Siente que se ahoga cuando traga. Expectora o vomita sangre. Tiene dificultad para respirar. Se ahoga, no puede tragar ni puede respirar si permanece acostado. Nota una hinchazn o erupcin en el cuerpo, el rostro o la [place]. Estos sntomas pueden representar un problema grave que constituye Radio broadcast assistant. No espere a ver si los sntomas desaparecen. Solicite atencin mdica de inmediato. Comunquese con el servicio de emergencias de su localidad (911 en los [place]). No conduzca por sus propios medios [employer]. Resumen La ronquera, tambin denominada disfona, es todo cambio anormal en la voz que pueda dificultar el habla. La voz puede sonar spera, entrecortada o forzada. La ronquera es causada por un problema con las cuerdas vocales. No hable en voz alta ni con voz spera, no susurre, no utilice productos de nicotina o tabaco, ni ingiera alimentos que le causen acidez estomacal. Consulte al mdico si la ronquera no mejora despus de 2 semanas. Esta informacin no tiene Theme park manager el consejo del mdico. Asegrese de hacerle al mdico cualquier pregunta que tenga. Document Revised: 04/24/2021 Document Reviewed: 04/24/2021 Elsevier Patient Education  2024 [employer].

## 2023-07-17 NOTE — Telephone Encounter (Signed)
Pt has been notified and voices understanding.  

## 2023-07-17 NOTE — Telephone Encounter (Addendum)
MEDICATION:NOVOLIN R RELION 100 UNIT/ML injection [[id number]]   PHARMACY:  [facility] [address] ([address]), [place] - [address] (Ph: [phone])   HAS THE PATIENT CONTACTED THEIR PHARMACY?  Yes  IS THIS A 90 DAY SUPPLY : YEs  IS PATIENT OUT OF MEDICATION: Yes  IF NOT; HOW MUCH IS LEFT:   LAST APPOINTMENT DATE: @7 /31/24  NEXT APPOINTMENT DATE:@11 /06/2023  DO WE HAVE YOUR PERMISSION TO LEAVE A DETAILED MESSAGE?: Yes  OTHER COMMENTS:    **Let patient know to contact pharmacy at the end of the day to make sure medication is ready. **  ** Please notify patient to allow 48-72 hours to process**  **Encourage patient to contact the pharmacy for refills or they can request refills through [facility]**

## 2023-07-17 NOTE — Telephone Encounter (Signed)
Patient states that she is also using Novolin R 20 units 3 times daily. Please advise if okay to send prescription as it doesn't appear on chart. Novolog was already sent. Patient will see you in November.

## 2023-07-17 NOTE — Telephone Encounter (Signed)
If patient is using NovoLog with meals she should not be using Novolin R at the same time.  I would not send a refill for Novolin R at this time.  She can use the NovoLog with meals.

## 2023-07-17 NOTE — Progress Notes (Signed)
Subjective:  Patient ID: Gabrielle Waller, female    DOB: 1965/12/11  Age: 57 y.o. MRN: [id number]  CC: Medical Management of Chronic Issues (Pt complains of very dry throat, coughing.Seen in hospital last Friday due to this.)   HPI Gabrielle Waller is a 57 y.o. year old female with a history of hypertension, hyperlipidemia, type 2 diabetes mellitus (A1c 8.6) Diabetes is managed by Endocrine - Dr. Lucianne Muss  Interval History: Discussed the use of AI scribe software for clinical note transcription with the patient, who gave verbal consent to proceed.  She presents with a persistent cough that has not improved despite treatment. The cough is severe enough to prevent her from speaking during episodes and is associated with a sensation of choking. The patient also reports a dryness in the throat. The patient had been to the emergency room for this issue, where a chest x-ray did not show any signs of pneumonia and it was thought to be an upper respiratory infection. The patient was given medication for the cough, which has provided some relief, but the cough persists.  In addition to the cough, the patient also reports pain in the left thigh for which she is on Cymbalta and gabapentin for chronic pain.   The patient also has diabetes, with a recent A1c of 9.0, and endorses adherence with her diabetic regimen as prescribed by her endocrinologist.   Doing well on her statin and her antihypertensive. She takes Trazodone for insomnia.       Past Medical History:  Diagnosis Date   Diabetes mellitus without complication (HCC)    Kidney stones     Past Surgical History:  Procedure Laterality Date   APPENDECTOMY      Family History  Problem Relation Age of Onset   Diabetes Mother     Social History   Socioeconomic History   Marital status: Married    Spouse name: Not on file   Number of children: 4   Years of education: 12   Highest education level: Not on file  Occupational History    Occupation: Cleaning  Tobacco Use   Smoking status: Never   Smokeless tobacco: Never  Vaping Use   Vaping status: Never Used  Substance and Sexual Activity   Alcohol use: No   Drug use: No   Sexual activity: Not on file  Other Topics Concern   Not on file  Social History Narrative   Fun: walks, listening to music   Denies abuse and feels safe at home.   Social Determinants of Health   Financial Resource Strain: Not on file  Food Insecurity: Not on file  Transportation Needs: Not on file  Physical Activity: Not on file  Stress: Not on file  Social Connections: Not on file    Allergies  Allergen Reactions   Penicillins Itching    Outpatient Medications Prior to Visit  Medication Sig Dispense Refill   acetaminophen (TYLENOL) 325 MG tablet Take 650 mg by mouth every 6 (six) hours as needed.     aspirin 325 MG EC tablet Take 325 mg by mouth daily.     benzonatate (TESSALON) 100 MG capsule Take 1 capsule (100 mg total) by mouth every 8 (eight) hours. 21 capsule 0   cetirizine (ZYRTEC) 10 MG tablet Take 1 tablet (10 mg total) by mouth daily. 30 tablet 1   Continuous Glucose Receiver (DEXCOM G7 RECEIVER) DEVI To display CGM data 1 each 0   Continuous Glucose Sensor (FREESTYLE LIBRE 3  SENSOR) MISC 1 Device by Does not apply route every 14 (fourteen) days. Apply 1 sensor on upper arm every 14 days for continuous glucose monitoring 2 each 2   famotidine (PEPCID) 20 MG tablet Take 1 tablet (20 mg total) by mouth 2 (two) times daily. 30 tablet 0   fluticasone (FLONASE) 50 MCG/ACT nasal spray Place 2 sprays into both nostrils daily. 16 g 1   Glucagon (BAQSIMI ONE PACK) 3 MG/DOSE POWD Use in nostril for sever low sugar 1 each 1   glucose blood (ONETOUCH VERIO) test strip USE 1 STRIP TO CHECK GLUCOSE THREE TIMES DAILY 300 each 1   insulin aspart (NOVOLOG FLEXPEN) 100 UNIT/ML FlexPen 18-22 units before meals 15 mL 1   insulin degludec (TRESIBA FLEXTOUCH) 200 UNIT/ML FlexTouch Pen Inject 50  Units into the skin daily. 9 mL 1   Insulin Disposable Pump (OMNIPOD 5 G6 POD, GEN 5,) MISC 1 Device by Does not apply route every 3 (three) days. 2 each 3   Insulin Syringe-Needle U-100 (INSULIN SYRINGE .5CC/31GX5/16") 31G X 5/16" 0.5 ML MISC Use to inject insulin 90 each 3   loratadine (CLARITIN) 10 MG tablet Take 1 tablet (10 mg total) by mouth daily. 90 tablet 0   meloxicam (MOBIC) 7.5 MG tablet Take 1 tablet (7.5 mg total) by mouth daily as needed for pain. 30 tablet 0   OneTouch Delica Lancets 30G MISC 1 each by Does not apply route in the morning, at noon, and at bedtime. Use onetouch delica lancets to check blood sugar 3 times daily. 100 each 2   TOUJEO SOLOSTAR 300 UNIT/ML Solostar Pen Inject into the skin.     traZODone (DESYREL) 50 MG tablet Take 1 tablet (50 mg total) by mouth at bedtime as needed for sleep. 90 tablet 1   Vitamin D, Ergocalciferol, (DRISDOL) 50000 units CAPS capsule Take 50,000 Units by mouth every 7 (seven) days.     DULoxetine (CYMBALTA) 60 MG capsule Take 1 capsule (60 mg total) by mouth daily. For chronic pain 90 capsule 1   gabapentin (NEURONTIN) 300 MG capsule Take 1 capsule (300 mg total) by mouth at bedtime. 90 capsule 1   rosuvastatin (CRESTOR) 40 MG tablet TAKE 1 TABLET BY MOUTH EVERY DAY 90 tablet 1   telmisartan (MICARDIS) 20 MG tablet Take 1 tablet (20 mg total) by mouth daily. 90 tablet 1   No facility-administered medications prior to visit.     ROS Review of Systems  Constitutional:  Negative for activity change and appetite change.  HENT:  Negative for sinus pressure and sore throat.   Respiratory:  Positive for cough. Negative for chest tightness, shortness of breath and wheezing.   Cardiovascular:  Negative for chest pain and palpitations.  Gastrointestinal:  Negative for abdominal distention, abdominal pain and constipation.  Genitourinary: Negative.   Musculoskeletal: Negative.   Psychiatric/Behavioral:  Negative for behavioral problems and  dysphoric mood.     Objective:  BP 132/77   Pulse 67   Ht 4\' 11"  (1.499 m)   Wt 113 lb 12.8 oz (51.6 kg)   SpO2 100%   BMI 22.98 kg/m      07/17/2023    9:30 AM 07/17/2023    8:58 AM 07/12/2023   10:15 PM  BP/Weight  Systolic BP 132 145 120  Diastolic BP 77 83 75  Wt. (Lbs)  113.8   BMI  22.98 kg/m2       Physical Exam Constitutional:      Appearance: She is  well-developed.  HENT:     Right Ear: Tympanic membrane normal.     Left Ear: Tympanic membrane normal.     Mouth/Throat:     Mouth: Mucous membranes are moist.  Cardiovascular:     Rate and Rhythm: Normal rate.     Heart sounds: Normal heart sounds. No murmur heard. Pulmonary:     Effort: Pulmonary effort is normal.     Breath sounds: Normal breath sounds. No wheezing or rales.  Chest:     Chest wall: No tenderness.  Abdominal:     General: Bowel sounds are normal. There is no distension.     Palpations: Abdomen is soft. There is no mass.     Tenderness: There is no abdominal tenderness.  Musculoskeletal:        General: Normal range of motion.     Right lower leg: No edema.     Left lower leg: No edema.  Neurological:     Mental Status: She is alert and oriented to person, place, and time.  Psychiatric:        Mood and Affect: Mood normal.        Latest Ref Rng & Units 07/12/2023    9:16 PM 07/12/2023    4:01 PM 06/03/2023   10:52 AM  CMP  Glucose 70 - 99 mg/dL 45  161  096   BUN 6 - 20 mg/dL 21  22  23    Creatinine 0.44 - 1.00 mg/dL 0.45  4.09  8.11   Sodium 135 - 145 mmol/L 143  139  138   Potassium 3.5 - 5.1 mmol/L 3.6  4.0  4.3   Chloride 98 - 111 mmol/L 107  105  101   CO2 22 - 32 mmol/L 24  14  26    Calcium 8.9 - 10.3 mg/dL 9.3  9.2  9.8   Total Protein 6.5 - 8.1 g/dL  6.3    Total Bilirubin 0.3 - 1.2 mg/dL  0.3    Alkaline Phos 38 - 126 U/L  118    AST 15 - 41 U/L  96    ALT 0 - 44 U/L  67  16     Lipid Panel     Component Value Date/Time   CHOL 185 11/20/2021 1036   TRIG 185.0 (H)  11/20/2021 1036   HDL 52.20 11/20/2021 1036   CHOLHDL 4 11/20/2021 1036   VLDL 37.0 11/20/2021 1036   LDLCALC 96 11/20/2021 1036   LDLDIRECT 98.0 06/03/2023 1052    CBC    Component Value Date/Time   WBC 6.0 07/12/2023 1601   RBC 3.70 (L) 07/12/2023 1601   HGB 11.4 (L) 07/12/2023 1601   HCT 36.0 07/12/2023 1601   PLT 287 07/12/2023 1601   MCV 97.3 07/12/2023 1601   MCH 30.8 07/12/2023 1601   MCHC 31.7 07/12/2023 1601   RDW 15.0 07/12/2023 1601   LYMPHSABS 1.9 11/13/2022 1654   MONOABS 0.3 11/13/2022 1654   EOSABS 0.4 11/13/2022 1654   BASOSABS 0.0 11/13/2022 1654    Lab Results  Component Value Date   HGBA1C 9.0 (H) 06/03/2023    Assessment & Plan:      Cough Persistent URI Persistent cough with hoarseness. Chest X-ray in the ER showed no pneumonia. Patient has been prescribed a cough medication but forgot to bring it to the appointment. -Prescribe a 5-day course of Prednisone to help with the cough.  Hypertension Initial high blood pressure reading in the office, but repeat reading was  normal. Currently on Telmisartan 20mg . -Continue Telmisartan 20mg  daily.  -Counseled on blood pressure goal of less than 130/80, low-sodium, DASH diet, medication compliance, 150 minutes of moderate intensity exercise per week. Discussed medication compliance, adverse effects.  Insomnia Patient is on Trazodone for sleep but reports no current issues with sleep. -Continue Trazodone as needed for sleep.  Type 2 diabetes mellitus Last A1C was 9.0, indicating poor control. Patient is on [place] for diabetes. -Increase Tresiba from 50 to 53 units for the duration of the Prednisone course (5 days), then return to regular dose. -Management per endocrinology  Chronic thigh pain Currently on gabapentin and duloxetine  Hyperlipidemia Controlled Continue statin  General Health Maintenance -Refer for annual eye exam. -Send home colon cancer screening kit. -Administer flu shot  today. -Schedule follow-up appointment in 6 months.          Meds ordered this encounter  Medications   DULoxetine (CYMBALTA) 60 MG capsule    Sig: Take 1 capsule (60 mg total) by mouth daily. For chronic pain    Dispense:  90 capsule    Refill:  1   gabapentin (NEURONTIN) 300 MG capsule    Sig: Take 1 capsule (300 mg total) by mouth at bedtime.    Dispense:  90 capsule    Refill:  1   rosuvastatin (CRESTOR) 40 MG tablet    Sig: Take 1 tablet (40 mg total) by mouth daily.    Dispense:  90 tablet    Refill:  1   DISCONTD: telmisartan (MICARDIS) 40 MG tablet    Sig: Take 1 tablet (40 mg total) by mouth daily.    Dispense:  90 tablet    Refill:  1    Dose increased   predniSONE (DELTASONE) 20 MG tablet    Sig: Take 1 tablet (20 mg total) by mouth daily with breakfast.    Dispense:  5 tablet    Refill:  0   telmisartan (MICARDIS) 20 MG tablet    Sig: Take 1 tablet (20 mg total) by mouth daily.    Dispense:  90 tablet    Refill:  1    Discontinue 40 mg    Follow-up: Return in about 6 months (around 01/14/2024) for Chronic medical conditions.       Hoy Register, MD, FAAFP. [facility] [place], [place] [phone]   07/17/2023, 1:13 PM

## 2023-07-20 ENCOUNTER — Other Ambulatory Visit: Payer: Self-pay | Admitting: Physician Assistant

## 2023-07-22 NOTE — Telephone Encounter (Signed)
Unable to refill per protocol, Rx expired. Discontinued 02/25/23.  Requested Prescriptions  Pending Prescriptions Disp Refills   NOVOLIN R RELION 100 UNIT/ML injection [Pharmacy Med Name: NovoLIN R ReliOn 100 UNIT/ML Injection Solution] 20 mL 0    Sig: INJECT 25 UNITS SUBCUTANEOUSLY THREE TIMES DAILY BEFORE MEAL(S)     Endocrinology:  Diabetes - Insulins Failed - 07/20/2023  6:43 PM      Failed - HBA1C is between 0 and 7.9 and within 180 days    HbA1c, POC (controlled diabetic range)  Date Value Ref Range Status  11/14/2022 8.0 (A) 0.0 - 7.0 % Final   Hgb A1c MFr Bld  Date Value Ref Range Status  06/03/2023 9.0 (H) 4.6 - 6.5 % Final    Comment:    Glycemic Control Guidelines for People with Diabetes:Non Diabetic:  <6%Goal of Therapy: <7%Additional Action Suggested:  >8%          Passed - Valid encounter within last 6 months    Recent Outpatient Visits           5 days ago Other cough   [place] [facility] Hoy Register, MD   2 months ago Screening for cervical cancer   [employer] [facility] Hoy Register, MD   4 months ago Pain of left thigh   [facility] Hoy Register, MD   6 months ago Other insomnia   [place] [facility] [place], Odette Horns, MD   8 months ago Type 1 diabetes mellitus with other specified complication [facility])    [facility] [place], [place], [place]

## 2023-08-01 ENCOUNTER — Emergency Department (HOSPITAL_COMMUNITY)
Admission: EM | Admit: 2023-08-01 | Discharge: 2023-08-01 | Disposition: A | Discharge status: Home / Self Care | Payer: BLUE CROSS/BLUE SHIELD | Attending: Emergency Medicine | Admitting: Emergency Medicine

## 2023-08-01 ENCOUNTER — Encounter (HOSPITAL_COMMUNITY): Payer: Self-pay

## 2023-08-01 ENCOUNTER — Other Ambulatory Visit: Payer: Self-pay

## 2023-08-01 ENCOUNTER — Emergency Department (HOSPITAL_COMMUNITY): Payer: BLUE CROSS/BLUE SHIELD

## 2023-08-01 DIAGNOSIS — E1310 Other specified diabetes mellitus with ketoacidosis without coma: Secondary | ICD-10-CM

## 2023-08-01 DIAGNOSIS — R002 Palpitations: Secondary | ICD-10-CM | POA: Diagnosis not present

## 2023-08-01 DIAGNOSIS — R739 Hyperglycemia, unspecified: Secondary | ICD-10-CM | POA: Diagnosis present

## 2023-08-01 DIAGNOSIS — I10 Essential (primary) hypertension: Secondary | ICD-10-CM | POA: Diagnosis not present

## 2023-08-01 DIAGNOSIS — Z7982 Long term (current) use of aspirin: Secondary | ICD-10-CM | POA: Diagnosis not present

## 2023-08-01 DIAGNOSIS — E1010 Type 1 diabetes mellitus with ketoacidosis without coma: Secondary | ICD-10-CM | POA: Diagnosis not present

## 2023-08-01 LAB — CBG MONITORING, ED
Glucose-Capillary: 282 mg/dL — ABNORMAL HIGH (ref 70–99)
Glucose-Capillary: 197 mg/dL — ABNORMAL HIGH (ref 70–99)
Glucose-Capillary: 198 mg/dL — ABNORMAL HIGH (ref 70–99)
Glucose-Capillary: 134 mg/dL — ABNORMAL HIGH (ref 70–99)

## 2023-08-01 LAB — CBC WITH DIFFERENTIAL/PLATELET
Abs Immature Granulocytes: 0.11 10*3/uL — ABNORMAL HIGH (ref 0.00–0.07)
Basophils Absolute: 0.1 10*3/uL (ref 0.0–0.1)
Basophils Relative: 1 %
Eosinophils Absolute: 0 10*3/uL (ref 0.0–0.5)
Eosinophils Relative: 0 %
HCT: 38.1 % (ref 36.0–46.0)
Hemoglobin: 12.2 g/dL (ref 12.0–15.0)
Immature Granulocytes: 1 %
Lymphocytes Relative: 19 %
Lymphs Abs: 1.8 10*3/uL (ref 0.7–4.0)
MCH: 30.3 pg (ref 26.0–34.0)
MCHC: 32 g/dL (ref 30.0–36.0)
MCV: 94.8 fL (ref 80.0–100.0)
Monocytes Absolute: 0.5 10*3/uL (ref 0.1–1.0)
Monocytes Relative: 5 %
Neutro Abs: 6.9 10*3/uL (ref 1.7–7.7)
Neutrophils Relative %: 74 %
Platelets: 363 10*3/uL (ref 150–400)
RBC: 4.02 MIL/uL (ref 3.87–5.11)
RDW: 15.8 % — ABNORMAL HIGH (ref 11.5–15.5)
WBC: 9.3 10*3/uL (ref 4.0–10.5)
nRBC: 0 % (ref 0.0–0.2)

## 2023-08-01 LAB — I-STAT VENOUS BLOOD GAS, ED
Acid-base deficit: 14 mmol/L — ABNORMAL HIGH (ref 0.0–2.0)
Bicarbonate: 11.7 mmol/L — ABNORMAL LOW (ref 20.0–28.0)
Calcium, Ion: 1.2 mmol/L (ref 1.15–1.40)
HCT: 39 % (ref 36.0–46.0)
Hemoglobin: 13.3 g/dL (ref 12.0–15.0)
O2 Saturation: 92 %
Potassium: 3.6 mmol/L (ref 3.5–5.1)
Sodium: 138 mmol/L (ref 135–145)
TCO2: 12 mmol/L — ABNORMAL LOW (ref 22–32)
pCO2, Ven: 25.8 mmHg — ABNORMAL LOW (ref 44–60)
pH, Ven: 7.264 (ref 7.25–7.43)
pO2, Ven: 72 mmHg — ABNORMAL HIGH (ref 32–45)

## 2023-08-01 LAB — COMPREHENSIVE METABOLIC PANEL
ALT: 59 U/L — ABNORMAL HIGH (ref 0–44)
AST: 86 U/L — ABNORMAL HIGH (ref 15–41)
Albumin: 3.2 g/dL — ABNORMAL LOW (ref 3.5–5.0)
Alkaline Phosphatase: 133 U/L — ABNORMAL HIGH (ref 38–126)
Anion gap: 24 — ABNORMAL HIGH (ref 5–15)
BUN: 29 mg/dL — ABNORMAL HIGH (ref 6–20)
CO2: 11 mmol/L — ABNORMAL LOW (ref 22–32)
Calcium: 9.1 mg/dL (ref 8.9–10.3)
Chloride: 103 mmol/L (ref 98–111)
Creatinine, Ser: 1.56 mg/dL — ABNORMAL HIGH (ref 0.44–1.00)
GFR, Estimated: 39 mL/min — ABNORMAL LOW (ref >60)
Glucose, Bld: 324 mg/dL — ABNORMAL HIGH (ref 70–99)
Potassium: 3.8 mmol/L (ref 3.5–5.1)
Sodium: 138 mmol/L (ref 135–145)
Total Bilirubin: 0.5 mg/dL (ref 0.3–1.2)
Total Protein: 6.6 g/dL (ref 6.5–8.1)

## 2023-08-01 LAB — URINALYSIS, ROUTINE W REFLEX MICROSCOPIC
Bilirubin Urine: NEGATIVE
Glucose, UA: >=500 mg/dL — AB
Ketones, ur: 20 mg/dL — AB
Leukocytes,Ua: NEGATIVE
Nitrite: NEGATIVE
Protein, ur: 100 mg/dL — AB
Specific Gravity, Urine: 1.012 (ref 1.005–1.030)
pH: 6 (ref 5.0–8.0)

## 2023-08-01 LAB — BASIC METABOLIC PANEL
Anion gap: 11 (ref 5–15)
BUN: 21 mg/dL — ABNORMAL HIGH (ref 6–20)
CO2: 20 mmol/L — ABNORMAL LOW (ref 22–32)
Calcium: 8.6 mg/dL — ABNORMAL LOW (ref 8.9–10.3)
Chloride: 109 mmol/L (ref 98–111)
Creatinine, Ser: 1.19 mg/dL — ABNORMAL HIGH (ref 0.44–1.00)
GFR, Estimated: 53 mL/min — ABNORMAL LOW (ref >60)
Glucose, Bld: 150 mg/dL — ABNORMAL HIGH (ref 70–99)
Potassium: 3.7 mmol/L (ref 3.5–5.1)
Sodium: 140 mmol/L (ref 135–145)

## 2023-08-01 LAB — TROPONIN I (HIGH SENSITIVITY)
Troponin I (High Sensitivity): 5 ng/L (ref <18)
Troponin I (High Sensitivity): 5 ng/L (ref <18)

## 2023-08-01 LAB — COLOGUARD: COLOGUARD: NEGATIVE

## 2023-08-01 LAB — BETA-HYDROXYBUTYRIC ACID: Beta-Hydroxybutyric Acid: 0.28 mmol/L — ABNORMAL HIGH (ref 0.05–0.27)

## 2023-08-01 MED ORDER — LACTATED RINGERS IV SOLN: INTRAVENOUS | Status: DC

## 2023-08-01 MED ORDER — LACTATED RINGERS IV BOLUS
1000.0000 mL | INTRAVENOUS | Status: AC
  Administered 2023-08-01: 1000 mL via INTRAVENOUS

## 2023-08-01 MED ORDER — INSULIN REGULAR(HUMAN) IN NACL 100-0.9 UT/100ML-% IV SOLN: INTRAVENOUS | Status: DC

## 2023-08-01 MED ORDER — DEXTROSE IN LACTATED RINGERS 5 % IV SOLN: INTRAVENOUS | Status: DC

## 2023-08-01 MED ORDER — DEXTROSE 50 % IV SOLN: 0.0000 mL | INTRAVENOUS | Status: DC | PRN

## 2023-08-01 MED ORDER — POTASSIUM CHLORIDE 10 MEQ/100ML IV SOLN
10.0000 meq | INTRAVENOUS | Status: AC
  Administered 2023-08-01 (×2): 10 meq via INTRAVENOUS
  Filled 2023-08-01 (×2): qty 100

## 2023-08-01 MED ORDER — INSULIN REGULAR(HUMAN) IN NACL 100-0.9 UT/100ML-% IV SOLN
INTRAVENOUS | Status: DC
  Administered 2023-08-01: 2.2 [IU]/h via INTRAVENOUS
  Filled 2023-08-01: qty 100

## 2023-08-01 MED ORDER — LACTATED RINGERS IV BOLUS
1000.0000 mL | Freq: Once | INTRAVENOUS | Status: AC
  Administered 2023-08-01: 1000 mL via INTRAVENOUS

## 2023-08-01 NOTE — Discharge Instructions (Addendum)
Today you were treated for diabetic ketoacidosis.  Please follow-up with your PCP as soon as possible.  A referral was also placed for cardiology for palpitations.  Thank you for letting us treat you today. After reviewing your labs and imaging, I feel you are safe to go home. Please follow up with your PCP in the next several days and provide them with your records from this visit. Return to the Emergency Room if pain becomes severe or symptoms worsen.

## 2023-08-01 NOTE — ED Provider Notes (Signed)
[facility] AT [facility] Provider Note   CSN: [id number] Arrival date & time: 08/01/23  1525     History  Chief Complaint  Patient presents with   Shortness of Breath   Hyperglycemia    Gabrielle Waller is a 57 y.o. female past medical history significant for type 1 diabetes, hyperlipidemia, hypertension presents today for shortness of breath/chest pain/hyperglycemia.  Patient states she woke up around 1 AM last night feeling short of breath and having palpitations which lasted about 30 minutes.  She states the same thing happened around 1 or 2:00 this afternoon.  She says her blood sugar is usually in the 500s and was recorded at 345 today in the ER.  Patient denies polyuria, fever, abdominal pain, nausea, diarrhea, vomiting, dizziness.  She admits to cough x 1 year.   Shortness of Breath Associated symptoms: chest pain and cough   Associated symptoms: no abdominal pain, no fever and no headaches   Hyperglycemia Associated symptoms: chest pain and shortness of breath   Associated symptoms: no abdominal pain, no dizziness, no fever, no nausea, no polyuria and no weakness        Home Medications Prior to Admission medications   Medication Sig Start Date End Date Taking? Authorizing Provider  acetaminophen (TYLENOL) 325 MG tablet Take 650 mg by mouth every 6 (six) hours as needed.    [provider]  aspirin 325 MG EC tablet Take 325 mg by mouth daily.    [provider]  benzonatate (TESSALON) 100 MG capsule Take 1 capsule (100 mg total) by mouth every 8 (eight) hours. 07/12/23   Barrett, Horald Chestnut, PA-C  cetirizine (ZYRTEC) 10 MG tablet Take 1 tablet (10 mg total) by mouth daily. 07/19/22   Hoy Register, MD  Continuous Glucose Receiver (DEXCOM G7 RECEIVER) DEVI To display CGM data 03/06/23   Reather Littler, MD  Continuous Glucose Sensor (FREESTYLE LIBRE 3 SENSOR) MISC 1 Device by Does not apply route every 14 (fourteen) days. Apply 1 sensor  on upper arm every 14 days for continuous glucose monitoring 06/05/23   Reather Littler, MD  DULoxetine (CYMBALTA) 60 MG capsule Take 1 capsule (60 mg total) by mouth daily. For chronic pain 07/17/23   Hoy Register, MD  famotidine (PEPCID) 20 MG tablet Take 1 tablet (20 mg total) by mouth 2 (two) times daily. 10/03/20   Elvina Sidle, MD  fluticasone (FLONASE) 50 MCG/ACT nasal spray Place 2 sprays into both nostrils daily. 07/19/22   Hoy Register, MD  gabapentin (NEURONTIN) 300 MG capsule Take 1 capsule (300 mg total) by mouth at bedtime. 07/17/23   Hoy Register, MD  Glucagon (BAQSIMI ONE PACK) 3 MG/DOSE POWD Use in nostril for sever low sugar 05/22/21   Reather Littler, MD  glucose blood [facility] VERIO) test strip USE 1 STRIP TO CHECK GLUCOSE THREE TIMES DAILY 02/12/23   Reather Littler, MD  insulin aspart (NOVOLOG FLEXPEN) 100 UNIT/ML FlexPen 18-22 units before meals 07/17/23   Thapa, [place], MD  insulin degludec (TRESIBA FLEXTOUCH) 200 UNIT/ML FlexTouch Pen Inject 50 Units into the skin daily. 02/25/23   Reather Littler, MD  Insulin Disposable Pump (OMNIPOD 5 G6 POD, GEN 5,) MISC 1 Device by Does not apply route every 3 (three) days. 09/03/22   Reather Littler, MD  Insulin Syringe-Needle U-100 (INSULIN SYRINGE .5CC/31GX5/16") 31G X 5/16" 0.5 ML MISC Use to inject insulin 08/21/21   Reather Littler, MD  loratadine (CLARITIN) 10 MG tablet Take 1 tablet (10 mg  total) by mouth daily. 03/08/16   Veryl Speak, FNP  meloxicam (MOBIC) 7.5 MG tablet Take 1 tablet (7.5 mg total) by mouth daily as needed for pain. 06/05/21   Vivi Barrack, DPM  OneTouch Delica Lancets 30G MISC 1 each by Does not apply route in the morning, at noon, and at bedtime. Use onetouch delica lancets to check blood sugar 3 times daily. 02/04/20   Reather Littler, MD  predniSONE (DELTASONE) 20 MG tablet Take 1 tablet (20 mg total) by mouth daily with breakfast. 07/17/23   Hoy Register, MD  rosuvastatin (CRESTOR) 40 MG tablet Take 1 tablet (40 mg total)  by mouth daily. 07/17/23   Hoy Register, MD  telmisartan (MICARDIS) 20 MG tablet Take 1 tablet (20 mg total) by mouth daily. 07/17/23   Hoy Register, MD  TOUJEO SOLOSTAR 300 UNIT/ML Solostar Pen Inject into the skin. 05/25/23   [provider]  traZODone (DESYREL) 50 MG tablet Take 1 tablet (50 mg total) by mouth at bedtime as needed for sleep. 01/17/23   Hoy Register, MD  Vitamin D, Ergocalciferol, (DRISDOL) 50000 units CAPS capsule Take 50,000 Units by mouth every 7 (seven) days.    [provider]      Allergies    Penicillins    Review of Systems   Review of Systems  Constitutional:  Negative for chills and fever.  Respiratory:  Positive for cough and shortness of breath.   Cardiovascular:  Positive for chest pain.  Gastrointestinal:  Negative for abdominal pain and nausea.  Endocrine: Negative for polyuria.  Neurological:  Negative for dizziness, weakness and headaches.    Physical Exam Updated Vital Signs BP 130/66   Pulse 89   Temp 98 F (36.7 C) (Oral)   Resp (!) 21   SpO2 100%  Physical Exam Vitals and nursing note reviewed.  Constitutional:      General: She is not in acute distress.    Appearance: She is well-developed.  HENT:     Head: Normocephalic and atraumatic.     Mouth/Throat:     Mouth: Mucous membranes are moist.     Pharynx: Oropharynx is clear.  Eyes:     Conjunctiva/sclera: Conjunctivae normal.     Pupils: Pupils are equal, round, and reactive to light.  Cardiovascular:     Rate and Rhythm: Tachycardia present. Rhythm irregular.     Pulses: No decreased pulses.     Heart sounds: No murmur heard. Pulmonary:     Effort: Pulmonary effort is normal. Tachypnea present. No respiratory distress.     Breath sounds: Normal breath sounds.  Chest:     Chest wall: No tenderness.  Abdominal:     Palpations: Abdomen is soft.     Tenderness: There is no abdominal tenderness.  Musculoskeletal:        General: No swelling.      Cervical back: Neck supple.     Right lower leg: No edema.     Left lower leg: No edema.  Skin:    General: Skin is warm and dry.  Neurological:     Mental Status: She is alert and oriented to person, place, and time.     Motor: No weakness.  Psychiatric:        Mood and Affect: Mood normal.     ED Results / Procedures / Treatments   Labs (all labs ordered are listed, but only abnormal results are displayed) Labs Reviewed  CBC WITH DIFFERENTIAL/PLATELET - Abnormal; Notable for the  following components:      Result Value   RDW 15.8 (*)    Abs Immature Granulocytes 0.11 (*)    All other components within normal limits  URINALYSIS, ROUTINE W REFLEX MICROSCOPIC - Abnormal; Notable for the following components:   Glucose, UA >=500 (*)    Hgb urine dipstick SMALL (*)    Ketones, ur 20 (*)    Protein, ur 100 (*)    Bacteria, UA RARE (*)    All other components within normal limits  BETA-HYDROXYBUTYRIC ACID - Abnormal; Notable for the following components:   Beta-Hydroxybutyric Acid 0.28 (*)    All other components within normal limits  COMPREHENSIVE METABOLIC PANEL - Abnormal; Notable for the following components:   CO2 11 (*)    Glucose, Bld 324 (*)    BUN 29 (*)    Creatinine, Ser 1.56 (*)    Albumin 3.2 (*)    AST 86 (*)    ALT 59 (*)    Alkaline Phosphatase 133 (*)    GFR, Estimated 39 (*)    Anion gap 24 (*)    All other components within normal limits  BASIC METABOLIC PANEL - Abnormal; Notable for the following components:   CO2 20 (*)    Glucose, Bld 150 (*)    BUN 21 (*)    Creatinine, Ser 1.19 (*)    Calcium 8.6 (*)    GFR, Estimated 53 (*)    All other components within normal limits  CBG MONITORING, ED - Abnormal; Notable for the following components:   Glucose-Capillary 282 (*)    All other components within normal limits  CBG MONITORING, ED - Abnormal; Notable for the following components:   Glucose-Capillary 197 (*)    All other components within normal  limits  I-STAT VENOUS BLOOD GAS, ED - Abnormal; Notable for the following components:   pCO2, Ven 25.8 (*)    pO2, Ven 72 (*)    Bicarbonate 11.7 (*)    TCO2 12 (*)    Acid-base deficit 14.0 (*)    All other components within normal limits  CBG MONITORING, ED - Abnormal; Notable for the following components:   Glucose-Capillary 198 (*)    All other components within normal limits  CBG MONITORING, ED - Abnormal; Notable for the following components:   Glucose-Capillary 134 (*)    All other components within normal limits  TROPONIN I (HIGH SENSITIVITY)  TROPONIN I (HIGH SENSITIVITY)    EKG EKG Interpretation Date/Time:  Thursday August 01 2023 15:43:33 EDT Ventricular Rate:  120 PR Interval:  119 QRS Duration:  82 QT Interval:  325 QTC Calculation: 460 R Axis:   72  Text Interpretation: Sinus tachycardia Ventricular trigeminy Consider right atrial enlargement Consider left ventricular hypertrophy Nonspecific T abnormalities, diffuse leads Confirmed by Vonita Moss [phone]) on 08/01/2023 6:14:07 PM  Radiology DG Chest 2 View  Result Date: 08/01/2023 CLINICAL DATA:  Shortness of breath. EXAM: CHEST - 2 VIEW COMPARISON:  07/12/2023. FINDINGS: Bilateral lung fields are clear. Bilateral costophrenic angles are clear. Normal cardio-mediastinal silhouette. No acute osseous abnormalities. The soft tissues are within normal limits. IMPRESSION: No active cardiopulmonary disease. Electronically Signed   By: Jules Schick M.D.   On: 08/01/2023 18:53    Procedures Procedures    Medications Ordered in ED Medications  lactated ringers bolus 1,000 mL (0 mLs Intravenous Stopped 08/01/23 1737)  potassium chloride 10 mEq in 100 mL IVPB (0 mEq Intravenous Stopped 08/01/23 2052)  lactated ringers bolus 1,000  mL (0 mLs Intravenous Stopped 08/01/23 2012)    ED Course/ Medical Decision Making/ A&P Clinical Course as of 08/01/23 2139  Thu Aug 01, 2023  2137 Resp(!): 21 [KK]    Clinical  Course User Index [KK] Dolphus Jenny, PA-C                                 Medical Decision Making This patient presents to the ED with chief complaint(s) of chest pain/shortness of breath/hyperglycemia with pertinent past medical history of type 1 diabetes, hyperlipidemia, hypertension which further complicates the presenting complaint. The complaint involves an extensive differential diagnosis and also carries with it a high risk of complications and morbidity.    The differential diagnosis includes DKA, MI, arrhythmia, pneumonia  Additional history obtained: Records reviewed endocrine progress note.  ED Course and Reassessment: Patient placed on cardiac monitoring, administered 1 L LR bolus Patient anion gap 24 and started on DKA protocol  Independent labs interpretation:  The following labs were independently interpreted:  CBC with differential: No remarkable findings. CMP: Anion gap of 24, glucose 324, potassium 3.6 Venous blood gas: Compensated metabolic acidosis Troponin: Unremarkable at 5 on 2 collections 2 hours apart EKG: Ventricular trigeminy, nonspecific T wave abnormality, sinus tach Beta hydroxybutyric acid  Independent visualization of imaging: - I independently visualized the following imaging with scope of interpretation limited to determining acute life threatening conditions related to emergency care: Chest x-ray, which revealed bilateral lung fields clear, bilateral costophrenic angles are clear, normal cardiomediastinal silhouette, no acute osseous abnormalities, soft tissues are within normal limits, no active cardiopulmonary disease.  Consultation: - Consulted or discussed management/test interpretation w/ external professional: None  Consideration for admission or further workup: Considered for admission, however patient's vital signs are stable other than being slightly tachypneic at 21 breaths/min. Anion gap has been closed and patient feels stable enough to  return home.          Final Clinical Impression(s) / ED Diagnoses Final diagnoses:  Diabetic ketoacidosis without coma associated with other specified diabetes mellitus (HCC)  Palpitations    Rx / DC Orders ED Discharge Orders          Ordered    Ambulatory referral to Cardiology       Comments: If you have not heard from the Cardiology office within the next 72 hours please call [phone].   08/01/23 2133              Dolphus Jenny, PA-C 08/01/23 2141    Rondel Baton, MD 08/02/23 1515

## 2023-08-01 NOTE — ED Triage Notes (Signed)
Pt arrived via ems from home for the c/o sob, chest pain, beinf thirsty all the time. Bs 345, hx vocal cord issues, new onset diabetes. Ems states pt is tachycardic, runs of trigeminy. Aspirin given on the way to er.

## 2023-08-01 NOTE — ED Provider Notes (Signed)
9:36 PM Patient seen in conjunction with Jalene Mullet.  She has a history of diabetes, insulin-dependent, presents with hyperglycemia, frequent PVCs, shortness of breath and chest pain.  She has seen PCP with [facility].  Initially found to have moderately elevated blood sugar with elevated anion gap and low bicarbonate.  Questionable mild DKA versus hyperglycemia/dehydration.  Patient has not been vomiting but has had some nausea.  No abdominal pain.  Beta-hydroxybutyrate came back almost normal.  Patient was treated in the emergency department with IV fluid boluses and IV insulin.  She continued to appear well.  BMP was rechecked and gap has closed with only minimally low bicarbonate.  Feel patient is stable for discharge and she is feeling much better.  She continues to have some ectopy on the monitor.  No concern for acute MI, PE at this time.  Patient does have concerns about her swallowing and I have encouraged her to stick with a softer diet and follow-up with her PCP regarding this.  She agrees.  Anticipate discharged home with close outpatient follow-up.  BP 130/66   Pulse 89   Temp 98 F (36.7 C) (Oral)   Resp (!) 27   SpO2 100%     Renne Crigler, Cordelia Poche 08/01/23 2139    Rondel Baton, MD 08/02/23 1515

## 2023-08-06 NOTE — Progress Notes (Unsigned)
Cardiology Office Note:    Date:  08/07/2023   ID:  Gabrielle Waller, DOB Jan 15, 1966, MRN [id number]  PCP:  Hoy Register, MD  Cardiologist:  None  Electrophysiologist:  None   Referring MD: Hoy Register, MD   Chief Complaint  Patient presents with   New Patient (Initial Visit)    Chest pain   Edema    Left leg   Shortness of Breath    History of Present Illness:    Gabrielle Waller is a 57 y.o. female with a hx of type 1 diabetes, hyperlipidemia who is referred by Dr. Alvis Lemmings for evaluation of palpitations.  She reports having palpitations over the last 6 months, occurs a few times per week and feels like heart is racing.  Also feels short of breath during episodes.  Typically lasts 1 to 2 minutes.  She denies any chest pain.  Does report he has been having some shortness of breath.  She walks for 2 to 3 days/week for about 30 minutes.  She does report some dyspnea on exertion.  She denies any lightheadedness or syncope.  Does report some swelling in left leg.  No smoking history.  No family history of heart disease.  Past Medical History:  Diagnosis Date   Diabetes mellitus without complication (HCC)    Kidney stones     Past Surgical History:  Procedure Laterality Date   APPENDECTOMY      Current Medications: Current Meds  Medication Sig   acetaminophen (TYLENOL) 325 MG tablet Take 650 mg by mouth every 6 (six) hours as needed.   aspirin 325 MG EC tablet Take 325 mg by mouth daily.   benzonatate (TESSALON) 100 MG capsule Take 1 capsule (100 mg total) by mouth every 8 (eight) hours.   cetirizine (ZYRTEC) 10 MG tablet Take 1 tablet (10 mg total) by mouth daily.   Continuous Glucose Receiver (DEXCOM G7 RECEIVER) DEVI To display CGM data   Continuous Glucose Sensor (FREESTYLE LIBRE 3 SENSOR) MISC 1 Device by Does not apply route every 14 (fourteen) days. Apply 1 sensor on upper arm every 14 days for continuous glucose monitoring   DULoxetine (CYMBALTA) 60 MG capsule Take 1  capsule (60 mg total) by mouth daily. For chronic pain   famotidine (PEPCID) 20 MG tablet Take 1 tablet (20 mg total) by mouth 2 (two) times daily.   fluticasone (FLONASE) 50 MCG/ACT nasal spray Place 2 sprays into both nostrils daily.   gabapentin (NEURONTIN) 300 MG capsule Take 1 capsule (300 mg total) by mouth at bedtime.   Glucagon (BAQSIMI ONE PACK) 3 MG/DOSE POWD Use in nostril for sever low sugar   glucose blood (ONETOUCH VERIO) test strip USE 1 STRIP TO CHECK GLUCOSE THREE TIMES DAILY   insulin aspart (NOVOLOG FLEXPEN) 100 UNIT/ML FlexPen 18-22 units before meals   insulin degludec (TRESIBA FLEXTOUCH) 200 UNIT/ML FlexTouch Pen Inject 50 Units into the skin daily.   Insulin Disposable Pump (OMNIPOD 5 G6 POD, GEN 5,) MISC 1 Device by Does not apply route every 3 (three) days.   Insulin Syringe-Needle U-100 (INSULIN SYRINGE .5CC/31GX5/16") 31G X 5/16" 0.5 ML MISC Use to inject insulin   loratadine (CLARITIN) 10 MG tablet Take 1 tablet (10 mg total) by mouth daily.   meloxicam (MOBIC) 7.5 MG tablet Take 1 tablet (7.5 mg total) by mouth daily as needed for pain.   OneTouch Delica Lancets 30G MISC 1 each by Does not apply route in the morning, at noon, and at bedtime.  Use onetouch delica lancets to check blood sugar 3 times daily.   predniSONE (DELTASONE) 20 MG tablet Take 1 tablet (20 mg total) by mouth daily with breakfast.   rosuvastatin (CRESTOR) 40 MG tablet Take 1 tablet (40 mg total) by mouth daily.   telmisartan (MICARDIS) 20 MG tablet Take 1 tablet (20 mg total) by mouth daily.   TOUJEO SOLOSTAR 300 UNIT/ML Solostar Pen Inject into the skin.   traZODone (DESYREL) 50 MG tablet Take 1 tablet (50 mg total) by mouth at bedtime as needed for sleep.   Vitamin D, Ergocalciferol, (DRISDOL) 50000 units CAPS capsule Take 50,000 Units by mouth every 7 (seven) days.     Allergies:   Penicillins   Social History   Socioeconomic History   Marital status: Married    Spouse name: Not on file    Number of children: 4   Years of education: 12   Highest education level: Not on file  Occupational History   Occupation: Cleaning  Tobacco Use   Smoking status: Never   Smokeless tobacco: Never  Vaping Use   Vaping status: Never Used  Substance and Sexual Activity   Alcohol use: No   Drug use: No   Sexual activity: Not on file  Other Topics Concern   Not on file  Social History Narrative   Fun: walks, listening to music   Denies abuse and feels safe at home.   Social Determinants of Health   Financial Resource Strain: Not on file  Food Insecurity: Not on file  Transportation Needs: Not on file  Physical Activity: Not on file  Stress: Not on file  Social Connections: Not on file     Family History: The patient's family history includes Diabetes in her mother.  ROS:   Please see the history of present illness.     All other systems reviewed and are negative.  EKGs/Labs/Other Studies Reviewed:    The following studies were reviewed today:   EKG:   08/01/2023: Sinus tachycardia, rate 120, ventricular trigeminy, LVH  Recent Labs: 06/03/2023: TSH 0.40 08/01/2023: ALT 59; BUN 21; Creatinine, Ser 1.19; Hemoglobin 13.3; Platelets 363; Potassium 3.7; Sodium 140  Recent Lipid Panel    Component Value Date/Time   CHOL 185 11/20/2021 1036   TRIG 185.0 (H) 11/20/2021 1036   HDL 52.20 11/20/2021 1036   CHOLHDL 4 11/20/2021 1036   VLDL 37.0 11/20/2021 1036   LDLCALC 96 11/20/2021 1036   LDLDIRECT 98.0 06/03/2023 1052    Physical Exam:    VS:  BP (!) 116/58 (BP Location: Left Arm, Patient Position: Sitting, Cuff Size: Normal)   Pulse (!) 109   Ht 5\' 4"  (1.626 m)   Wt 109 lb (49.4 kg)   SpO2 97%   BMI 18.71 kg/m     Wt Readings from Last 3 Encounters:  08/07/23 109 lb (49.4 kg)  07/17/23 113 lb 12.8 oz (51.6 kg)  07/12/23 114 lb 10.2 oz (52 kg)     GEN:  Well nourished, well developed in no acute distress HEENT: Normal NECK: No JVD; No carotid  bruits CARDIAC: Irregular, normal rate no murmurs, rubs, gallops RESPIRATORY:  Clear to auscultation without rales, wheezing or rhonchi  ABDOMEN: Soft, non-tender, non-distended MUSCULOSKELETAL:  No edema; No deformity  SKIN: Warm and dry NEUROLOGIC:  Alert and oriented x 3 PSYCHIATRIC:  Normal affect   ASSESSMENT:    1. Palpitations   2. Mixed hyperlipidemia   3. Frequent PVCs    PLAN:  Palpitations: Description concerning for arrhythmia, noted to have frequent PVCs when in ED 07/2023.  Check Zio patch x 7 days for further evaluation.  Check echocardiogram  Hyperlipidemia: On rosuvastatin 40 mg daily  Hypertension: On telmisartan 20 mg daily.  Appears controlled  T2DM: On insulin, A1c 9.0% on 06/03/2023, follows with endocrinology  RTC in 4 months   Medication Adjustments/Labs and Tests Ordered: Current medicines are reviewed at length with the patient today.  Concerns regarding medicines are outlined above.  Orders Placed This Encounter  Procedures   Lipid panel   Comprehensive Metabolic Panel (CMET)   Magnesium   TSH   LONG TERM MONITOR (3-14 DAYS)   ECHOCARDIOGRAM COMPLETE   No orders of the defined types were placed in this encounter.   Patient Instructions  Medication Instructions:  Continue current medications *If you need a refill on your cardiac medications before your next appointment, please call your pharmacy*   Lab Work: Cmet, Magnesium, TSH, Lipid panel today If you have labs (blood work) drawn today and your tests are completely normal, you will receive your results only by: MyChart Message (if you have MyChart) OR A paper copy in the mail If you have any lab test that is abnormal or we need to change your treatment, we will call you to review the results.   Testing/Procedures: 7 Day heart monitor (ZIO  ECHO  Follow-Up: At [facility], you and your health needs are our priority.  As part of our continuing mission to provide you  with exceptional heart care, we have created designated Provider Care Teams.  These Care Teams include your primary Cardiologist (physician) and Advanced Practice Providers (APPs -  Physician Assistants and Nurse Practitioners) who all work together to provide you with the care you need, when you need it.  We recommend signing up for the patient portal called "MyChart".  Sign up information is provided on this After Visit Summary.  MyChart is used to connect with patients for Virtual Visits (Telemedicine).  Patients are able to view lab/test results, encounter notes, upcoming appointments, etc.  Non-urgent messages can be sent to your provider as well.   To learn more about what you can do with MyChart, go to [email or web address].    Your next appointment:    4 months  Provider:   Dr. Bjorn Pippin     Signed, Little Ishikawa, MD  08/07/2023 2:49 PM    [facility]

## 2023-08-07 ENCOUNTER — Encounter: Payer: Self-pay | Admitting: Cardiology

## 2023-08-07 ENCOUNTER — Ambulatory Visit (INDEPENDENT_AMBULATORY_CARE_PROVIDER_SITE_OTHER): Payer: BLUE CROSS/BLUE SHIELD

## 2023-08-07 ENCOUNTER — Ambulatory Visit: Payer: BLUE CROSS/BLUE SHIELD | Attending: Cardiology | Admitting: Cardiology

## 2023-08-07 VITALS — BP 116/58 | HR 109 | Ht 64.0 in | Wt 109.0 lb

## 2023-08-07 DIAGNOSIS — R002 Palpitations: Secondary | ICD-10-CM

## 2023-08-07 DIAGNOSIS — I493 Ventricular premature depolarization: Secondary | ICD-10-CM

## 2023-08-07 DIAGNOSIS — E782 Mixed hyperlipidemia: Secondary | ICD-10-CM

## 2023-08-07 NOTE — Patient Instructions (Addendum)
Medication Instructions:  Continue current medications *If you need a refill on your cardiac medications before your next appointment, please call your pharmacy*   Lab Work: Cmet, Magnesium, TSH, Lipid panel today If you have labs (blood work) drawn today and your tests are completely normal, you will receive your results only by: MyChart Message (if you have MyChart) OR A paper copy in the mail If you have any lab test that is abnormal or we need to change your treatment, we will call you to review the results.   Testing/Procedures: 7 Day heart monitor (ZIO  ECHO  Follow-Up: At [facility], you and your health needs are our priority.  As part of our continuing mission to provide you with exceptional heart care, we have created designated Provider Care Teams.  These Care Teams include your primary Cardiologist (physician) and Advanced Practice Providers (APPs -  Physician Assistants and Nurse Practitioners) who all work together to provide you with the care you need, when you need it.  We recommend signing up for the patient portal called "MyChart".  Sign up information is provided on this After Visit Summary.  MyChart is used to connect with patients for Virtual Visits (Telemedicine).  Patients are able to view lab/test results, encounter notes, upcoming appointments, etc.  Non-urgent messages can be sent to your provider as well.   To learn more about what you can do with MyChart, go to [email or web address].    Your next appointment:    4 months  Provider:   Dr. Bjorn Pippin

## 2023-08-07 NOTE — Progress Notes (Unsigned)
Enrolled for [employer] to mail a ZIO XT long term holter monitor to the patients address on file.  

## 2023-08-08 DIAGNOSIS — R002 Palpitations: Secondary | ICD-10-CM | POA: Diagnosis not present

## 2023-08-08 DIAGNOSIS — E782 Mixed hyperlipidemia: Secondary | ICD-10-CM

## 2023-08-08 DIAGNOSIS — I493 Ventricular premature depolarization: Secondary | ICD-10-CM

## 2023-08-08 LAB — COMPREHENSIVE METABOLIC PANEL
ALT: 49 [IU]/L — ABNORMAL HIGH (ref 0–32)
AST: 56 [IU]/L — ABNORMAL HIGH (ref 0–40)
Albumin: 4.2 g/dL (ref 3.8–4.9)
Alkaline Phosphatase: 158 [IU]/L — ABNORMAL HIGH (ref 44–121)
BUN/Creatinine Ratio: 22 (ref 9–23)
BUN: 22 mg/dL (ref 6–24)
Bilirubin Total: <0.2 mg/dL (ref 0.0–1.2)
CO2: 19 mmol/L — ABNORMAL LOW (ref 20–29)
Calcium: 9.5 mg/dL (ref 8.7–10.2)
Chloride: 100 mmol/L (ref 96–106)
Creatinine, Ser: 0.99 mg/dL (ref 0.57–1.00)
Globulin, Total: 2.5 g/dL (ref 1.5–4.5)
Glucose: 323 mg/dL — ABNORMAL HIGH (ref 70–99)
Potassium: 4.5 mmol/L (ref 3.5–5.2)
Sodium: 137 mmol/L (ref 134–144)
Total Protein: 6.7 g/dL (ref 6.0–8.5)
eGFR: 67 mL/min/{1.73_m2} (ref >59)

## 2023-08-08 LAB — LIPID PANEL
Chol/HDL Ratio: 6 {ratio} — ABNORMAL HIGH (ref 0.0–4.4)
Cholesterol, Total: 290 mg/dL — ABNORMAL HIGH (ref 100–199)
HDL: 48 mg/dL (ref >39)
LDL Chol Calc (NIH): 162 mg/dL — ABNORMAL HIGH (ref 0–99)
Triglycerides: 415 mg/dL — ABNORMAL HIGH (ref 0–149)
VLDL Cholesterol Cal: 80 mg/dL — ABNORMAL HIGH (ref 5–40)

## 2023-08-08 LAB — MAGNESIUM: Magnesium: 2.1 mg/dL (ref 1.6–2.3)

## 2023-08-08 LAB — TSH: TSH: 0.366 u[IU]/mL — ABNORMAL LOW (ref 0.450–4.500)

## 2023-08-13 ENCOUNTER — Telehealth: Payer: Self-pay | Admitting: Endocrinology

## 2023-08-13 NOTE — Telephone Encounter (Signed)
I spoke to Gabrielle Waller and explained to her that she has a lab appointment on October 31st and then will follow up with Dr Erroll Luna , I told her to just wait until then to see how here lab work results at that time

## 2023-08-13 NOTE — Telephone Encounter (Signed)
Patient came in to office today to check when follow up appointments are.  Patient states that she had labs done one week ago and she was told that her thyroid levels and blood sugar levels were elevated.  Patient would like to know what she can do.

## 2023-08-21 ENCOUNTER — Other Ambulatory Visit: Payer: Self-pay

## 2023-08-21 DIAGNOSIS — E782 Mixed hyperlipidemia: Secondary | ICD-10-CM

## 2023-08-21 MED ORDER — EZETIMIBE 10 MG PO TABS
10.0000 mg | ORAL_TABLET | Freq: Every day | ORAL | 3 refills | Status: DC
Start: 2023-08-21 — End: 2024-06-18

## 2023-09-02 ENCOUNTER — Other Ambulatory Visit: Payer: Self-pay

## 2023-09-02 ENCOUNTER — Other Ambulatory Visit (HOSPITAL_COMMUNITY): Payer: BLUE CROSS/BLUE SHIELD

## 2023-09-02 DIAGNOSIS — E1065 Type 1 diabetes mellitus with hyperglycemia: Secondary | ICD-10-CM

## 2023-09-02 DIAGNOSIS — E0590 Thyrotoxicosis, unspecified without thyrotoxic crisis or storm: Secondary | ICD-10-CM

## 2023-09-05 ENCOUNTER — Other Ambulatory Visit: Payer: Self-pay

## 2023-09-05 ENCOUNTER — Other Ambulatory Visit (INDEPENDENT_AMBULATORY_CARE_PROVIDER_SITE_OTHER): Payer: BLUE CROSS/BLUE SHIELD

## 2023-09-05 ENCOUNTER — Telehealth: Payer: Self-pay | Admitting: Endocrinology

## 2023-09-05 DIAGNOSIS — E1065 Type 1 diabetes mellitus with hyperglycemia: Secondary | ICD-10-CM

## 2023-09-05 LAB — BASIC METABOLIC PANEL
BUN: 14 mg/dL (ref 6–23)
CO2: 29 meq/L (ref 19–32)
Calcium: 9.7 mg/dL (ref 8.4–10.5)
Chloride: 103 meq/L (ref 96–112)
Creatinine, Ser: 0.83 mg/dL (ref 0.40–1.20)
GFR: 78.4 mL/min (ref >60.00)
Glucose, Bld: 259 mg/dL — ABNORMAL HIGH (ref 70–99)
Potassium: 4.4 meq/L (ref 3.5–5.1)
Sodium: 140 meq/L (ref 135–145)

## 2023-09-05 MED ORDER — ONETOUCH VERIO VI STRP: ORAL_STRIP | 1 refills | Status: DC

## 2023-09-05 NOTE — Telephone Encounter (Signed)
MEDICATION: OneTouch Verio glucose blood (ONETOUCH VERIO) test strip  PHARMACY:    [id number] - [place], [place] - [address] (Ph: [phone])    HAS THE PATIENT CONTACTED THEIR PHARMACY?  Yes  IS THIS A 90 DAY SUPPLY : Yes  IS PATIENT OUT OF MEDICATION: Yes  IF NOT; HOW MUCH IS LEFT:   LAST APPOINTMENT DATE: @7 /31/2024  NEXT APPOINTMENT DATE:@11 /06/2023  DO WE HAVE YOUR PERMISSION TO LEAVE A DETAILED MESSAGE?: YEs  OTHER COMMENTS:    **Let patient know to contact pharmacy at the end of the day to make sure medication is ready. **  ** Please notify patient to allow 48-72 hours to process**  **Encourage patient to contact the pharmacy for refills or they can request refills through [facility]**

## 2023-09-05 NOTE — Telephone Encounter (Signed)
Test strips refill request complete

## 2023-09-06 LAB — HEMOGLOBIN A1C
Hgb A1c MFr Bld: 9.3 %{Hb} — ABNORMAL HIGH (ref <5.7)
Mean Plasma Glucose: 220 mg/dL
eAG (mmol/L): 12.2 mmol/L

## 2023-09-09 ENCOUNTER — Ambulatory Visit (HOSPITAL_COMMUNITY): Payer: BLUE CROSS/BLUE SHIELD | Attending: Cardiology

## 2023-09-09 ENCOUNTER — Encounter (HOSPITAL_COMMUNITY): Payer: Self-pay

## 2023-09-12 ENCOUNTER — Telehealth: Payer: Self-pay

## 2023-09-12 NOTE — Telephone Encounter (Signed)
Echo scheduled 09/08/24 was cancelled due insurance out of network. Spoke to patient through help of a Research officer, trade union.Patient requested Dr.Schumann refer her to another cardiologist.Advised to call her insurance and find out if [facility] or [facility] is in her network.Advised to call back and we can send a message to Dr.Schumann.

## 2023-09-13 ENCOUNTER — Encounter: Payer: Self-pay | Admitting: Endocrinology

## 2023-09-13 ENCOUNTER — Telehealth (HOSPITAL_COMMUNITY): Payer: Self-pay | Admitting: Cardiology

## 2023-09-13 ENCOUNTER — Ambulatory Visit: Payer: BLUE CROSS/BLUE SHIELD | Admitting: Endocrinology

## 2023-09-13 VITALS — BP 122/62 | HR 102 | Resp 20 | Ht 64.0 in | Wt 107.8 lb

## 2023-09-13 DIAGNOSIS — E1065 Type 1 diabetes mellitus with hyperglycemia: Secondary | ICD-10-CM | POA: Diagnosis not present

## 2023-09-13 MED ORDER — NOVOLOG FLEXPEN 100 UNIT/ML ~~LOC~~ SOPN: PEN_INJECTOR | SUBCUTANEOUS | 4 refills | Status: DC

## 2023-09-13 MED ORDER — TOUJEO SOLOSTAR 300 UNIT/ML ~~LOC~~ SOPN: 20.0000 [IU] | PEN_INJECTOR | Freq: Two times a day (BID) | SUBCUTANEOUS | 4 refills | Status: DC

## 2023-09-13 NOTE — Patient Instructions (Addendum)
Diabetes regimen:  You should not take Relion Novolin R from [place] together with Novolog.   Take only Toujeo and Novolog. Stop Novolin R.  Toujeo increase to 20 units two times a day. Novolog 15 units in the morning food and 15 units with evening meal.

## 2023-09-13 NOTE — Progress Notes (Signed)
Outpatient Endocrinology Note [place] Zeena Starkel, MD  09/13/23  Patient's Name: Gabrielle Waller    DOB: October 20, 1966    MRN: [id number]                                                    REASON OF VISIT: Follow up of type 1 diabetes mellitus  PCP: Hoy Register, MD  HISTORY OF PRESENT ILLNESS:   Gabrielle Waller is a 57 y.o. old female with past medical history listed below, is here for follow up for type 1 diabetes mellitus.   Pertinent Diabetes History: Patient was diagnosed with type 1 diabetes mellitus in 2001.  Patient has uncontrolled type 1 diabetes mellitus.  Diabetes management has challenges with cost of medication and compliance.  She sometimes has difficulty understanding instructions for day-to-day management for diabetes.  She has labile blood sugars.  Chronic Diabetes Complications : Retinopathy: Yes. Last ophthalmology exam was done on annually at [facility], following with ophthalmology regularly.  Nephropathy: no, on ACE/ARB /telmisartan Peripheral neuropathy: yes, feet pain, no numbness, on gabapentin. Coronary artery disease: no Stroke: no  Relevant comorbidities and cardiovascular risk factors: Obesity: no Body mass index is 18.5 kg/m.  Hypertension: Yes  Hyperlipidemia : Yes, on statin   Current / Home Diabetic regimen includes: Toujeo 18 units in the morning and  18 units in the evening. NovoLog mostly 18 units with meals, 2 times a day.  Relion Novolin R 20 unit in the morning.   Prior diabetic medications:  Glycemic data:   One Facilities manager from October 25 to September 13, 2023 reviewed.  Average blood sugar 203, lowest blood sugar 44 and highest 418.  She has been taking 1.4 times a day in average. Blood sugar in the morning mostly high similar blood sugar 205, 418, 307, 331, 182, 115, 103, 247. Blood sugar in the evening variable and at times hypoglycemia with blood sugar 56, 44, other times blood sugar 164, 120 and send him  hyperglycemia with blood sugar 360, 319, 194.  CGM was planned in the past was not cost effective.  Hypoglycemia: Patient has minor hypoglycemic episodes. Patient has hypoglycemia awareness.  Factors modifying glucose control: 1.  Diabetic diet assessment: 2-3 meals a day.  2.  Staying active or exercising: No formal exercise.  3.  Medication compliance: compliant some of the time.  # She has a prior history of hyperthyroidism treated with Tapazole in 2001.  TSH level is now back to normal  Interval history  Patient is accompanied by son in the clinic today.  With confirmation and review of the medications for diabetes she has been taking Toujeo, NovoLog and Novolin R as listed above, confirmed by checking pictures of the insulin pen and prescription on her phone and also verifying with her son.  She has variable blood sugar with hyperglycemia mainly and occasional hypoglycemia.  Overall seems like she has confusion on insulin dosages and insulin she has been taking.  She has no other complaints today.  REVIEW OF SYSTEMS As per history of present illness.   PAST MEDICAL HISTORY: Past Medical History:  Diagnosis Date   Diabetes mellitus without complication (HCC)    Kidney stones     PAST SURGICAL HISTORY: Past Surgical History:  Procedure Laterality Date   APPENDECTOMY  ALLERGIES: Allergies  Allergen Reactions   Penicillins Itching    FAMILY HISTORY:  Family History  Problem Relation Age of Onset   Diabetes Mother     SOCIAL HISTORY: Social History   Socioeconomic History   Marital status: Married    Spouse name: Not on file   Number of children: 4   Years of education: 12   Highest education level: Not on file  Occupational History   Occupation: Cleaning  Tobacco Use   Smoking status: Never   Smokeless tobacco: Never  Vaping Use   Vaping status: Never Used  Substance and Sexual Activity   Alcohol use: No   Drug use: No   Sexual activity: Not on  file  Other Topics Concern   Not on file  Social History Narrative   Fun: walks, listening to music   Denies abuse and feels safe at home.   Social Determinants of Health   Financial Resource Strain: Not on file  Food Insecurity: Not on file  Transportation Needs: Not on file  Physical Activity: Not on file  Stress: Not on file  Social Connections: Not on file    MEDICATIONS:  Current Outpatient Medications  Medication Sig Dispense Refill   acetaminophen (TYLENOL) 325 MG tablet Take 650 mg by mouth every 6 (six) hours as needed.     aspirin 325 MG EC tablet Take 325 mg by mouth daily.     benzonatate (TESSALON) 100 MG capsule Take 1 capsule (100 mg total) by mouth every 8 (eight) hours. 21 capsule 0   cetirizine (ZYRTEC) 10 MG tablet Take 1 tablet (10 mg total) by mouth daily. 30 tablet 1   Continuous Glucose Receiver (DEXCOM G7 RECEIVER) DEVI To display CGM data 1 each 0   Continuous Glucose Sensor (FREESTYLE LIBRE 3 SENSOR) MISC 1 Device by Does not apply route every 14 (fourteen) days. Apply 1 sensor on upper arm every 14 days for continuous glucose monitoring 2 each 2   DULoxetine (CYMBALTA) 60 MG capsule Take 1 capsule (60 mg total) by mouth daily. For chronic pain 90 capsule 1   ezetimibe (ZETIA) 10 MG tablet Take 1 tablet (10 mg total) by mouth daily. 90 tablet 3   famotidine (PEPCID) 20 MG tablet Take 1 tablet (20 mg total) by mouth 2 (two) times daily. 30 tablet 0   fluticasone (FLONASE) 50 MCG/ACT nasal spray Place 2 sprays into both nostrils daily. 16 g 1   gabapentin (NEURONTIN) 300 MG capsule Take 1 capsule (300 mg total) by mouth at bedtime. 90 capsule 1   Glucagon (BAQSIMI ONE PACK) 3 MG/DOSE POWD Use in nostril for sever low sugar 1 each 1   glucose blood (ONETOUCH VERIO) test strip USE 1 STRIP TO CHECK GLUCOSE THREE TIMES DAILY 300 each 1   Insulin Syringe-Needle U-100 (INSULIN SYRINGE .5CC/31GX5/16") 31G X 5/16" 0.5 ML MISC Use to inject insulin 90 each 3    loratadine (CLARITIN) 10 MG tablet Take 1 tablet (10 mg total) by mouth daily. 90 tablet 0   meloxicam (MOBIC) 7.5 MG tablet Take 1 tablet (7.5 mg total) by mouth daily as needed for pain. 30 tablet 0   OneTouch Delica Lancets 30G MISC 1 each by Does not apply route in the morning, at noon, and at bedtime. Use onetouch delica lancets to check blood sugar 3 times daily. 100 each 2   predniSONE (DELTASONE) 20 MG tablet Take 1 tablet (20 mg total) by mouth daily with breakfast. 5 tablet 0  rosuvastatin (CRESTOR) 40 MG tablet Take 1 tablet (40 mg total) by mouth daily. 90 tablet 1   telmisartan (MICARDIS) 20 MG tablet Take 1 tablet (20 mg total) by mouth daily. 90 tablet 1   traZODone (DESYREL) 50 MG tablet Take 1 tablet (50 mg total) by mouth at bedtime as needed for sleep. 90 tablet 1   Vitamin D, Ergocalciferol, (DRISDOL) 50000 units CAPS capsule Take 50,000 Units by mouth every 7 (seven) days.     insulin aspart (NOVOLOG FLEXPEN) 100 UNIT/ML FlexPen Take 15 units with meals 2-3 times a day. 30 mL 4   TOUJEO SOLOSTAR 300 UNIT/ML Solostar Pen Inject 20 Units into the skin in the morning and at bedtime. 30 mL 4   No current facility-administered medications for this visit.    PHYSICAL EXAM: Vitals:   09/13/23 1022  BP: 122/62  Pulse: (!) 102  Resp: 20  SpO2: 98%  Weight: 107 lb 12.8 oz (48.9 kg)  Height: 5\' 4"  (1.626 m)   Body mass index is 18.5 kg/m.  Wt Readings from Last 3 Encounters:  09/13/23 107 lb 12.8 oz (48.9 kg)  08/07/23 109 lb (49.4 kg)  07/17/23 113 lb 12.8 oz (51.6 kg)    General: Well developed, well nourished female in no apparent distress.  HEENT: AT/[place], no external lesions.  Eyes: Conjunctiva clear and no icterus. Neck: Neck supple  Lungs: Respirations not labored Neurologic: Alert, oriented, normal speech Extremities / Skin: Dry.  Psychiatric: Does not appear depressed or anxious  Diabetic Foot Exam - Simple   No data filed    LABS Reviewed Lab Results   Component Value Date   HGBA1C 9.3 (H) 09/05/2023   HGBA1C 9.0 (H) 06/03/2023   HGBA1C 8.6 (A) 02/25/2023   Lab Results  Component Value Date   FRUCTOSAMINE 375 (H) 11/09/2016   FRUCTOSAMINE 323 (H) 07/01/2015   FRUCTOSAMINE 381 (H) 02/10/2015   Lab Results  Component Value Date   CHOL 290 (H) 08/07/2023   HDL 48 08/07/2023   LDLCALC 162 (H) 08/07/2023   LDLDIRECT 98.0 06/03/2023   TRIG 415 (H) 08/07/2023   CHOLHDL 6.0 (H) 08/07/2023   Lab Results  Component Value Date   MICRALBCREAT 18.9 06/03/2023   MICRALBCREAT 7.6 11/20/2021   Lab Results  Component Value Date   CREATININE 0.83 09/05/2023   Lab Results  Component Value Date   GFR 78.40 09/05/2023    ASSESSMENT / PLAN  1. Uncontrolled type 1 diabetes mellitus with hyperglycemia (HCC)     Diabetes Mellitus type 1, complicated by diabetic retinopathy and neuropathy. - Diabetic status / severity: Uncontrolled.  Lab Results  Component Value Date   HGBA1C 9.3 (H) 09/05/2023    - Hemoglobin A1c goal : <7%  Patient has confusion on insulin and insulin doses.  She is currently taking Toujeo, NovoLog and Novolin R.  - Medications: See below.  Patient is asked to stop ReliOn Novolin R.  She does not need to take Novolin R when taking NovoLog.  Asked to continue Toujeo and NovoLog only.  Patient was explained by showing pictures on her phone and also explained to her son.  I) increase Toujeo to 20 units 2 times a day.  She is currently taking 18 units 2 times a day. II) adjust NovoLog 15 units in the morning food and 15 unit with evening meal.  She is currently taking around 18 units with meals 2 times a day. III) stop Novolin R.  - Home glucose testing: Before  meals and at bedtime and as needed. - Discussed/ Gave Hypoglycemia treatment plan.  Patient has glucagon emergency kit at home.  # Consult : not required at this time.   # Annual urine for microalbuminuria/ creatinine ratio, no microalbuminuria  currently, continue ACE/ARB /telmisartan. Last  Lab Results  Component Value Date   MICRALBCREAT 18.9 06/03/2023    # Foot check nightly / neuropathy, continue gabapentin.  # She has diabetic retinopathy, following with ophthalmology.  - Diet: Make healthy diabetic food choices - Life style / activity / exercise: Discussed.  2. Blood pressure  -  BP Readings from Last 1 Encounters:  09/13/23 122/62    - Control is in target.  - No change in current plans.  3. Lipid status / Hyperlipidemia - Last  Lab Results  Component Value Date   LDLCALC 162 (H) 08/07/2023   - Continue rosuvastatin 40 mg daily.  Zetia 10 mg daily.  Managed by primary care provider.  Diagnoses and all orders for this visit:  Uncontrolled type 1 diabetes mellitus with hyperglycemia (HCC)  Other orders -     insulin aspart (NOVOLOG FLEXPEN) 100 UNIT/ML FlexPen; Take 15 units with meals 2-3 times a day. -     TOUJEO SOLOSTAR 300 UNIT/ML Solostar Pen; Inject 20 Units into the skin in the morning and at bedtime.    DISPOSITION Follow up in clinic in 6 weeks suggested.   All questions answered and patient verbalized understanding of the plan.  [place] Malvina Schadler, MD [facility] Endocrinology [facility] [address] [place], [address] [place], [place] [address] Phone # [phone]  At least part of this note was generated using voice recognition software. Inadvertent word errors may have occurred, which were not recognized during the proofreading process.

## 2023-09-13 NOTE — Telephone Encounter (Signed)
Patient NO SHOWED echocardiogram on 09/09/23. Please see below:  09/09/23 NO SHOW- SELF PAY Insurance out of network Order Reviewed [facility] 08/30/23 LVM OF APPT @ 10:49/LBW  08/19/2023 [employer] PATIENT'S POLICY ONLY COVERS GENETIC TESTING. SENT MESSAGE TO SCHEDULER, NURSE AND DOCTOR JB  Echo    Order will be removed from the echo WQ. IF patient calls back to schedule we will reinstate the order.

## 2023-09-18 ENCOUNTER — Other Ambulatory Visit: Payer: Self-pay

## 2023-09-18 MED ORDER — METOPROLOL TARTRATE 25 MG PO TABS: 25.0000 mg | ORAL_TABLET | Freq: Two times a day (BID) | ORAL | 3 refills | Status: DC

## 2023-10-14 NOTE — Progress Notes (Unsigned)
Cardiology Office Note    Date:  10/14/2023  ID:  Gabrielle Waller, DOB 08-22-1966, MRN [id number] PCP:  Hoy Register, MD  Cardiologist:  None  Electrophysiologist:  None   Chief Complaint: ***  History of Present Illness: Marland Kitchen    Gabrielle Waller is a 57 y.o. female with visit-pertinent history of type 1 diabetes, hyperlipidemia.  She was first evaluated by Dr. Gaynelle Waller on 08/07/2023 for palpitations.  She reported having palpitations for the prior 6 months occurred a few times a week and felt like her heart was racing.  She also became short of breath during these episodes, typically lasting 1 to 2 minutes.  She denied chest pain.  She wore a 7-day cardiac monitor that indicated an average heart rate of 101 bpm, minimum heart rate 69 bpm, max 152.  Predominant underlying rhythm was sinus rhythm.  PVCs were frequent at 9.7%, VE couplets were rare and VE triplets were rare.  Echocardiogram was recommended however patient is self-pay and no showed her appointment.  Today she presents for follow-up.  She reports that she  Palpitations: Cardiac monitor indicated an average heart rate of 101 bpm, minimum heart rate 69 bpm, max 152.  Predominant underlying rhythm was sinus rhythm.  PVCs were frequent at 9.7%, VE couplets were rare and VE triplets were rare.  Echocardiogram was recommended however patient is self-pay and no showed her appointment.  She has been started on metoprolol Today she reports  Hyperlipidemia: Last lipid profile on 08/07/2023 indicated total cholesterol 290, triglycerides 415, HDL 48 and LDL 162.  On rosuvastatin.  Dr. Gaynelle Waller started her on Zetia 10 mg daily.  Hypertension: Blood pressure today On telmisartan 20 mg daily  Type 2 diabetes mellitus: Hemoglobin A1c 9.3 on 09/05/2023.  Monitored and managed per PCP.    Labwork independently reviewed: 09/05/2023: Sodium 140, potassium 4.4, creatinine 0.83  ROS: .   *** denies chest pain, shortness of breath, lower extremity edema,  fatigue, palpitations, melena, hematuria, hemoptysis, diaphoresis, weakness, presyncope, syncope, orthopnea, and PND.  All other systems are reviewed and otherwise negative.  Studies Reviewed: Marland Kitchen    EKG:  EKG is ordered today, personally reviewed, demonstrating ***  CV Studies:  Cardiac Studies & Procedures         MONITORS  LONG TERM MONITOR (3-14 DAYS) 08/22/2023  Narrative   Frequent PVCs (9.7% of beats)   Patch Wear Time:  7 days and 3 hours (2024-10-03T14:33:23-0400 to 2024-10-10T18:21:32-0400)  Patient had a min HR of 69 bpm, max HR of 152 bpm, and avg HR of 101 bpm. Predominant underlying rhythm was Sinus Rhythm. Bundle Branch Block/IVCD was present. Isolated SVEs were rare (<1.0%), and no SVE Couplets or SVE Triplets were present. Isolated VEs were frequent (9.7%, 91689), VE Couplets were rare (<1.0%, 1), and VE Triplets were rare (<1.0%, 1). Ventricular Bigeminy and Trigeminy were present.             Current Reported Medications:.    No outpatient medications have been marked as taking for the 10/15/23 encounter (Appointment) with Rip Harbour, NP.    Physical Exam:    VS:  There were no vitals taken for this visit.   Wt Readings from Last 3 Encounters:  09/13/23 107 lb 12.8 oz (48.9 kg)  08/07/23 109 lb (49.4 kg)  07/17/23 113 lb 12.8 oz (51.6 kg)    GEN: Well nourished, well developed in no acute distress NECK: No JVD; No carotid bruits CARDIAC: ***RRR, no murmurs, rubs,  gallops RESPIRATORY:  Clear to auscultation without rales, wheezing or rhonchi  ABDOMEN: Soft, non-tender, non-distended EXTREMITIES:  No edema; No acute deformity   Asessement and Plan:.     ***     Disposition: F/u with ***  Signed, Rip Harbour, NP

## 2023-10-15 ENCOUNTER — Encounter: Payer: Self-pay | Admitting: Cardiology

## 2023-10-15 ENCOUNTER — Ambulatory Visit: Payer: BLUE CROSS/BLUE SHIELD | Attending: Cardiology | Admitting: Cardiology

## 2023-10-15 VITALS — BP 118/70 | HR 74 | Ht 64.0 in | Wt 109.0 lb

## 2023-10-15 DIAGNOSIS — Z758 Other problems related to medical facilities and other health care: Secondary | ICD-10-CM

## 2023-10-15 DIAGNOSIS — E782 Mixed hyperlipidemia: Secondary | ICD-10-CM

## 2023-10-15 DIAGNOSIS — I493 Ventricular premature depolarization: Secondary | ICD-10-CM

## 2023-10-15 DIAGNOSIS — Z603 Acculturation difficulty: Secondary | ICD-10-CM

## 2023-10-15 DIAGNOSIS — R002 Palpitations: Secondary | ICD-10-CM

## 2023-10-15 DIAGNOSIS — R0602 Shortness of breath: Secondary | ICD-10-CM | POA: Diagnosis not present

## 2023-10-15 NOTE — Patient Instructions (Signed)
Medication Instructions:  No changes *If you need a refill on your cardiac medications before your next appointment, please call your pharmacy*   Lab Work: Get fasting Lipid panel done in 2 months If you have labs (blood work) drawn today and your tests are completely normal, you will receive your results only by: MyChart Message (if you have MyChart) OR A paper copy in the mail If you have any lab test that is abnormal or we need to change your treatment, we will call you to review the results.   Testing/Procedures: Your physician has requested that you have an echocardiogram. Echocardiography is a painless test that uses sound waves to create images of your heart. It provides your doctor with information about the size and shape of your heart and how well your heart's chambers and valves are working. This procedure takes approximately one hour. There are no restrictions for this procedure. Please do NOT wear cologne, perfume, aftershave, or lotions (deodorant is allowed). Please arrive 15 minutes prior to your appointment time.  Please note: We ask at that you not bring children with you during ultrasound (echo/ vascular) testing. Due to room size and safety concerns, children are not allowed in the ultrasound rooms during exams. Our front office staff cannot provide observation of children in our lobby area while testing is being conducted. An adult accompanying a patient to their appointment will only be allowed in the ultrasound room at the discretion of the ultrasound technician under special circumstances. We apologize for any inconvenience.    Follow-Up: At [facility], you and your health needs are our priority.  As part of our continuing mission to provide you with exceptional heart care, we have created designated Provider Care Teams.  These Care Teams include your primary Cardiologist (physician) and Advanced Practice Providers (APPs -  Physician Assistants and Nurse  Practitioners) who all work together to provide you with the care you need, when you need it.  We recommend signing up for the patient portal called "MyChart".  Sign up information is provided on this After Visit Summary.  MyChart is used to connect with patients for Virtual Visits (Telemedicine).  Patients are able to view lab/test results, encounter notes, upcoming appointments, etc.  Non-urgent messages can be sent to your provider as well.   To learn more about what you can do with MyChart, go to [email or web address].    Your next appointment:   3 month(s)  Provider:   Little Ishikawa, MD

## 2023-11-15 ENCOUNTER — Ambulatory Visit: Payer: BLUE CROSS/BLUE SHIELD | Admitting: Endocrinology

## 2023-11-18 ENCOUNTER — Encounter (HOSPITAL_COMMUNITY): Payer: Self-pay | Admitting: Cardiology

## 2023-11-18 ENCOUNTER — Ambulatory Visit (HOSPITAL_COMMUNITY): Payer: BLUE CROSS/BLUE SHIELD | Attending: Cardiology

## 2023-12-18 ENCOUNTER — Ambulatory Visit: Payer: BLUE CROSS/BLUE SHIELD | Admitting: Endocrinology

## 2023-12-18 ENCOUNTER — Encounter: Payer: Self-pay | Admitting: Endocrinology

## 2023-12-18 VITALS — BP 126/80 | HR 79 | Resp 20 | Ht 64.0 in | Wt 110.0 lb

## 2023-12-18 DIAGNOSIS — E1065 Type 1 diabetes mellitus with hyperglycemia: Secondary | ICD-10-CM | POA: Diagnosis not present

## 2023-12-18 LAB — POCT GLYCOSYLATED HEMOGLOBIN (HGB A1C): Hemoglobin A1C: 10.2 % — AB (ref 4.0–5.6)

## 2023-12-18 MED ORDER — NOVOLOG FLEXPEN 100 UNIT/ML ~~LOC~~ SOPN: PEN_INJECTOR | SUBCUTANEOUS | 4 refills | Status: DC

## 2023-12-18 NOTE — Patient Instructions (Signed)
Diabetes regimen:  Toujeo 20 units two times a day.  Novolog 18 units in the morning food and 18 units with evening meal. Take this insulin 10-15 minutes before eating.

## 2023-12-18 NOTE — Progress Notes (Signed)
Outpatient Endocrinology Note Gabrielle Nakoa Ganus, MD  12/18/23  Patient's Name: Gabrielle Waller    DOB: 01/27/1966    MRN: [id number]                                                    REASON OF VISIT: Follow up of type 1 diabetes mellitus  PCP: Hoy Register, MD  HISTORY OF PRESENT ILLNESS:   Gabrielle Waller is a 58 y.o. old female with past medical history listed below, is here for follow up for type 1 diabetes mellitus.   Pertinent Diabetes History: Patient was diagnosed with type 1 diabetes mellitus in 2001.  Patient has uncontrolled type 1 diabetes mellitus.  Diabetes management has challenges with cost of medication and compliance.  She sometimes has difficulty understanding instructions for day-to-day management for diabetes.  She has labile blood sugars.  Chronic Diabetes Complications : Retinopathy: Yes. Last ophthalmology exam was done on annually at [facility], following with ophthalmology regularly.  Nephropathy: no, on ACE/ARB /telmisartan Peripheral neuropathy: yes, feet pain, no numbness, on gabapentin. Coronary artery disease: no Stroke: no  Relevant comorbidities and cardiovascular risk factors: Obesity: no Body mass index is 18.88 kg/m.  Hypertension: Yes  Hyperlipidemia : Yes, on statin   Current / Home Diabetic regimen includes: Toujeo 20 units in the morning and  20 units in the evening. NovoLog mostly 15 units with meals, 2 times a day.   Prior diabetic medications: Novolin R was stopped in November 2024.  Glycemic data:   One Touch Verio flex glucometer data from January 29 to December 18, 2023 reviewed, average blood sugar 228.  Highest blood sugar 299 and lowest 107.  She has been checking occasionally at different times of the day.  Morning fasting blood sugar 107, 139, 292, 284, blood sugar in the evening/afternoon 223, 219.  No hypoglycemia.  CGM was planned in the past was not cost effective.  Hypoglycemia: Patient has no hypoglycemic episodes.  Patient has hypoglycemia awareness.  Factors modifying glucose control: 1.  Diabetic diet assessment: 2 meals a day.  2.  Staying active or exercising: No formal exercise.  3.  Medication compliance: compliant some of the time.  # She has a prior history of hyperthyroidism treated with Tapazole in 2001.  TSH level is back to normal  Interval history  Diabetes regimen reviewed and as noted above, she reports compliance with taking both Toujeo and NovoLog insulin.  She eats 2 meals a day.  She denies snacks and bedtime snacking.  Glucometer data as reviewed above.  Hemoglobin A1c worsening 10.2% today.  No other complaints today.  REVIEW OF SYSTEMS As per history of present illness.   PAST MEDICAL HISTORY: Past Medical History:  Diagnosis Date   Diabetes mellitus without complication (HCC)    Kidney stones     PAST SURGICAL HISTORY: Past Surgical History:  Procedure Laterality Date   APPENDECTOMY      ALLERGIES: Allergies  Allergen Reactions   Penicillins Itching    FAMILY HISTORY:  Family History  Problem Relation Age of Onset   Diabetes Mother     SOCIAL HISTORY: Social History   Socioeconomic History   Marital status: Married    Spouse name: Not on file   Number of children: 4   Years of education: 12   Highest education  level: Not on file  Occupational History   Occupation: Cleaning  Tobacco Use   Smoking status: Never   Smokeless tobacco: Never  Vaping Use   Vaping status: Never Used  Substance and Sexual Activity   Alcohol use: No   Drug use: No   Sexual activity: Not on file  Other Topics Concern   Not on file  Social History Narrative   Fun: walks, listening to music   Denies abuse and feels safe at home.   Social Drivers of Corporate investment banker Strain: Not on file  Food Insecurity: Not on file  Transportation Needs: Not on file  Physical Activity: Not on file  Stress: Not on file  Social Connections: Not on file     MEDICATIONS:  Current Outpatient Medications  Medication Sig Dispense Refill   acetaminophen (TYLENOL) 325 MG tablet Take 650 mg by mouth every 6 (six) hours as needed.     benzonatate (TESSALON) 100 MG capsule Take 1 capsule (100 mg total) by mouth every 8 (eight) hours. 21 capsule 0   cetirizine (ZYRTEC) 10 MG tablet Take 1 tablet (10 mg total) by mouth daily. 30 tablet 1   Continuous Glucose Receiver (DEXCOM G7 RECEIVER) DEVI To display CGM data 1 each 0   Continuous Glucose Sensor (FREESTYLE LIBRE 3 SENSOR) MISC 1 Device by Does not apply route every 14 (fourteen) days. Apply 1 sensor on upper arm every 14 days for continuous glucose monitoring 2 each 2   DULoxetine (CYMBALTA) 60 MG capsule Take 1 capsule (60 mg total) by mouth daily. For chronic pain 90 capsule 1   famotidine (PEPCID) 20 MG tablet Take 1 tablet (20 mg total) by mouth 2 (two) times daily. 30 tablet 0   fluticasone (FLONASE) 50 MCG/ACT nasal spray Place 2 sprays into both nostrils daily. 16 g 1   gabapentin (NEURONTIN) 300 MG capsule Take 1 capsule (300 mg total) by mouth at bedtime. 90 capsule 1   Glucagon (BAQSIMI ONE PACK) 3 MG/DOSE POWD Use in nostril for sever low sugar 1 each 1   glucose blood (ONETOUCH VERIO) test strip USE 1 STRIP TO CHECK GLUCOSE THREE TIMES DAILY 300 each 1   Insulin Syringe-Needle U-100 (INSULIN SYRINGE .5CC/31GX5/16") 31G X 5/16" 0.5 ML MISC Use to inject insulin 90 each 3   loratadine (CLARITIN) 10 MG tablet Take 1 tablet (10 mg total) by mouth daily. 90 tablet 0   meloxicam (MOBIC) 7.5 MG tablet Take 1 tablet (7.5 mg total) by mouth daily as needed for pain. 30 tablet 0   OneTouch Delica Lancets 30G MISC 1 each by Does not apply route in the morning, at noon, and at bedtime. Use onetouch delica lancets to check blood sugar 3 times daily. 100 each 2   predniSONE (DELTASONE) 20 MG tablet Take 1 tablet (20 mg total) by mouth daily with breakfast. 5 tablet 0   rosuvastatin (CRESTOR) 40 MG tablet  Take 1 tablet (40 mg total) by mouth daily. 90 tablet 1   TOUJEO SOLOSTAR 300 UNIT/ML Solostar Pen Inject 20 Units into the skin in the morning and at bedtime. 30 mL 4   traZODone (DESYREL) 50 MG tablet Take 1 tablet (50 mg total) by mouth at bedtime as needed for sleep. 90 tablet 1   Vitamin D, Ergocalciferol, (DRISDOL) 50000 units CAPS capsule Take 50,000 Units by mouth every 7 (seven) days.     ezetimibe (ZETIA) 10 MG tablet Take 1 tablet (10 mg total) by mouth daily.  90 tablet 3   insulin aspart (NOVOLOG FLEXPEN) 100 UNIT/ML FlexPen Take 18 units with meals 2-3 times a day. 30 mL 4   metoprolol tartrate (LOPRESSOR) 25 MG tablet Take 1 tablet (25 mg total) by mouth 2 (two) times daily. 180 tablet 3   No current facility-administered medications for this visit.    PHYSICAL EXAM: Vitals:   12/18/23 1024  BP: 126/80  Pulse: 79  Resp: 20  SpO2: 98%  Weight: 110 lb (49.9 kg)  Height: 5\' 4"  (1.626 m)    Body mass index is 18.88 kg/m.  Wt Readings from Last 3 Encounters:  12/18/23 110 lb (49.9 kg)  10/15/23 109 lb (49.4 kg)  09/13/23 107 lb 12.8 oz (48.9 kg)    General: Well developed, well nourished female in no apparent distress.  HEENT: AT/[place], no external lesions.  Eyes: Conjunctiva clear and no icterus. Neck: Neck supple  Lungs: Respirations not labored Neurologic: Alert, oriented, normal speech Extremities / Skin: Dry.  Psychiatric: Does not appear depressed or anxious  Diabetic Foot Exam - Simple   No data filed    LABS Reviewed Lab Results  Component Value Date   HGBA1C 10.2 (A) 12/18/2023   HGBA1C 9.3 (H) 09/05/2023   HGBA1C 9.0 (H) 06/03/2023   Lab Results  Component Value Date   FRUCTOSAMINE 375 (H) 11/09/2016   FRUCTOSAMINE 323 (H) 07/01/2015   FRUCTOSAMINE 381 (H) 02/10/2015   Lab Results  Component Value Date   CHOL 290 (H) 08/07/2023   HDL 48 08/07/2023   LDLCALC 162 (H) 08/07/2023   LDLDIRECT 98.0 06/03/2023   TRIG 415 (H) 08/07/2023   CHOLHDL  6.0 (H) 08/07/2023   Lab Results  Component Value Date   MICRALBCREAT 18.9 06/03/2023   MICRALBCREAT 7.6 11/20/2021   Lab Results  Component Value Date   CREATININE 0.83 09/05/2023   Lab Results  Component Value Date   GFR 78.40 09/05/2023    ASSESSMENT / PLAN  1. Uncontrolled type 1 diabetes mellitus with hyperglycemia (HCC)     Diabetes Mellitus type 1, complicated by diabetic retinopathy and neuropathy. - Diabetic status / severity: Uncontrolled.  Lab Results  Component Value Date   HGBA1C 10.2 (A) 12/18/2023    - Hemoglobin A1c goal : <7%  Discussed about importance of controlling diabetes mellitus.  Discussed about compliance with monitoring blood sugar and also all the insulins.  She reports compliance with taking Toujeo and NovoLog.  Glucometer data, not much blood sugar data however has blood sugar mostly in 200s, no hypoglycemia.  Will keep the basal insulin same and increase the dose of NovoLog slightly.  Patient has labile blood sugars.  Patient is asked to take NovoLog before eating.  Asked to avoid snacks and high carbohydrate meals.  - Medications: See below.  Patient is asked to stop ReliOn Novolin R.  She does not need to take Novolin R when taking NovoLog.  Asked to continue Toujeo and NovoLog only.  Patient was explained by showing pictures on her phone and also explained to her son.  I) continue Toujeo 20 units 2 times a day.    II) increase NovoLog from 15 units to 18 units in the morning food and from 15 unit to 18 units with evening meal.    - Home glucose testing: Before meals and at bedtime and as needed.  Asked to check more often before each meal and at bedtime.  - Discussed/ Gave Hypoglycemia treatment plan.  Patient has glucagon emergency kit at  home.  # Consult : not required at this time.   # Annual urine for microalbuminuria/ creatinine ratio, no microalbuminuria currently, continue ACE/ARB /telmisartan. Last  Lab Results  Component  Value Date   MICRALBCREAT 18.9 06/03/2023    # Foot check nightly / neuropathy, continue gabapentin.  # She has diabetic retinopathy, following with ophthalmology.  - Diet: Make healthy diabetic food choices - Life style / activity / exercise: Discussed.  2. Blood pressure  -  BP Readings from Last 1 Encounters:  12/18/23 126/80    - Control is in target.  - No change in current plans.  3. Lipid status / Hyperlipidemia - Last  Lab Results  Component Value Date   LDLCALC 162 (H) 08/07/2023   - Continue rosuvastatin 40 mg daily.  Zetia 10 mg daily.  Managed by primary care provider.  Diagnoses and all orders for this visit:  Uncontrolled type 1 diabetes mellitus with hyperglycemia (HCC) -     POCT glycosylated hemoglobin (Hb A1C) -     insulin aspart (NOVOLOG FLEXPEN) 100 UNIT/ML FlexPen; Take 18 units with meals 2-3 times a day.     DISPOSITION Follow up in clinic in 3 months suggested.   All questions answered and patient verbalized understanding of the plan.  Gabrielle Vallie Teters, MD [facility] Endocrinology [facility] [address] [place], [address] [place], [place] [address] Phone # [phone]  At least part of this note was generated using voice recognition software. Inadvertent word errors may have occurred, which were not recognized during the proofreading process.

## 2024-01-03 ENCOUNTER — Ambulatory Visit: Payer: BLUE CROSS/BLUE SHIELD | Admitting: Cardiology

## 2024-01-06 ENCOUNTER — Encounter: Payer: Self-pay | Admitting: Family Medicine

## 2024-01-06 ENCOUNTER — Ambulatory Visit: Payer: BLUE CROSS/BLUE SHIELD | Attending: Family Medicine | Admitting: Family Medicine

## 2024-01-06 VITALS — BP 153/78 | HR 73 | Ht 64.0 in | Wt 111.0 lb

## 2024-01-06 DIAGNOSIS — E1069 Type 1 diabetes mellitus with other specified complication: Secondary | ICD-10-CM | POA: Diagnosis not present

## 2024-01-06 DIAGNOSIS — R7989 Other specified abnormal findings of blood chemistry: Secondary | ICD-10-CM

## 2024-01-06 DIAGNOSIS — E785 Hyperlipidemia, unspecified: Secondary | ICD-10-CM

## 2024-01-06 DIAGNOSIS — Z794 Long term (current) use of insulin: Secondary | ICD-10-CM

## 2024-01-06 DIAGNOSIS — M79652 Pain in left thigh: Secondary | ICD-10-CM

## 2024-01-06 DIAGNOSIS — I10 Essential (primary) hypertension: Secondary | ICD-10-CM | POA: Diagnosis not present

## 2024-01-06 DIAGNOSIS — R058 Other specified cough: Secondary | ICD-10-CM

## 2024-01-06 DIAGNOSIS — E1159 Type 2 diabetes mellitus with other circulatory complications: Secondary | ICD-10-CM

## 2024-01-06 DIAGNOSIS — E1042 Type 1 diabetes mellitus with diabetic polyneuropathy: Secondary | ICD-10-CM | POA: Diagnosis not present

## 2024-01-06 MED ORDER — CETIRIZINE HCL 10 MG PO TABS: 10.0000 mg | ORAL_TABLET | Freq: Every day | ORAL | 1 refills | Status: AC

## 2024-01-06 MED ORDER — DULOXETINE HCL 60 MG PO CPEP
60.0000 mg | ORAL_CAPSULE | Freq: Every day | ORAL | 1 refills | Status: DC
Start: 2024-01-06 — End: 2024-01-14

## 2024-01-06 MED ORDER — ROSUVASTATIN CALCIUM 40 MG PO TABS
40.0000 mg | ORAL_TABLET | Freq: Every day | ORAL | 1 refills | Status: DC
Start: 2024-01-06 — End: 2024-07-31

## 2024-01-06 MED ORDER — GABAPENTIN 300 MG PO CAPS
600.0000 mg | ORAL_CAPSULE | Freq: Every day | ORAL | 1 refills | Status: DC
Start: 2024-01-06 — End: 2024-09-01

## 2024-01-06 MED ORDER — FLUTICASONE PROPIONATE 50 MCG/ACT NA SUSP: 2.0000 | Freq: Every day | NASAL | 1 refills | Status: DC

## 2024-01-06 NOTE — Progress Notes (Signed)
 Subjective:  Patient ID: Gabrielle Waller, female    DOB: 11/23/1965  Age: 58 y.o. MRN: [id number]  CC: Medical Management of Chronic Issues (Dry cough/)     Discussed the use of AI scribe software for clinical note transcription with the patient, who gave verbal consent to proceed.  History of Present Illness Gabrielle Waller, a 58 year old female with a history of hypertension, hyperlipidemia, and type 1 diabetes mellitus, presents with elevated blood pressure despite adherence to her antihypertensive regimen. She also reports a chronic cough with phlegm production that has persisted for approximately two years.  Her med list reveals the presence of Flonase and an antihistamine which she states she has not been using.  In addition to her primary complaints, Gabrielle Waller describes a persistent burning pain in her thigh that worsens with walking. Despite taking gabapentin and Cymbalta, she reports no significant relief from her symptoms. She also mentions a recent visit to the endocrinologist for diabetes management and an upcoming appointment with an eye doctor in [place].  Her diabetes is uncontrolled with an A1c of 10.2 from 1 month ago.  She is adherent with her regimen per her endocrinologist. Her lipid panel revealed uncontrolled cholesterol and she remains on Crestor and Zetia which she has been adherent with.    Past Medical History:  Diagnosis Date   Diabetes mellitus without complication (HCC)    Kidney stones     Past Surgical History:  Procedure Laterality Date   APPENDECTOMY      Family History  Problem Relation Age of Onset   Diabetes Mother     Social History   Socioeconomic History   Marital status: Married    Spouse name: Not on file   Number of children: 4   Years of education: 12   Highest education level: Not on file  Occupational History   Occupation: Cleaning  Tobacco Use   Smoking status: Never   Smokeless tobacco: Never  Vaping Use   Vaping status: Never  Used  Substance and Sexual Activity   Alcohol use: No   Drug use: No   Sexual activity: Not on file  Other Topics Concern   Not on file  Social History Narrative   Fun: walks, listening to music   Denies abuse and feels safe at home.   Social Drivers of Corporate investment banker Strain: Not on file  Food Insecurity: Not on file  Transportation Needs: Not on file  Physical Activity: Not on file  Stress: Not on file  Social Connections: Not on file    Allergies  Allergen Reactions   Penicillins Itching    Outpatient Medications Prior to Visit  Medication Sig Dispense Refill   acetaminophen (TYLENOL) 325 MG tablet Take 650 mg by mouth every 6 (six) hours as needed.     cetirizine (ZYRTEC) 10 MG tablet Take 1 tablet (10 mg total) by mouth daily. 30 tablet 1   Continuous Glucose Receiver (DEXCOM G7 RECEIVER) DEVI To display CGM data 1 each 0   Continuous Glucose Sensor (FREESTYLE LIBRE 3 SENSOR) MISC 1 Device by Does not apply route every 14 (fourteen) days. Apply 1 sensor on upper arm every 14 days for continuous glucose monitoring 2 each 2   DULoxetine (CYMBALTA) 60 MG capsule Take 1 capsule (60 mg total) by mouth daily. For chronic pain 90 capsule 1   famotidine (PEPCID) 20 MG tablet Take 1 tablet (20 mg total) by mouth 2 (two) times daily. 30 tablet 0  fluticasone (FLONASE) 50 MCG/ACT nasal spray Place 2 sprays into both nostrils daily. 16 g 1   gabapentin (NEURONTIN) 300 MG capsule Take 1 capsule (300 mg total) by mouth at bedtime. 90 capsule 1   Glucagon (BAQSIMI ONE PACK) 3 MG/DOSE POWD Use in nostril for sever low sugar 1 each 1   glucose blood (ONETOUCH VERIO) test strip USE 1 STRIP TO CHECK GLUCOSE THREE TIMES DAILY 300 each 1   insulin aspart (NOVOLOG FLEXPEN) 100 UNIT/ML FlexPen Take 18 units with meals 2-3 times a day. 30 mL 4   Insulin Syringe-Needle U-100 (INSULIN SYRINGE .5CC/31GX5/16") 31G X 5/16" 0.5 ML MISC Use to inject insulin 90 each 3   loratadine  (CLARITIN) 10 MG tablet Take 1 tablet (10 mg total) by mouth daily. 90 tablet 0   meloxicam (MOBIC) 7.5 MG tablet Take 1 tablet (7.5 mg total) by mouth daily as needed for pain. 30 tablet 0   OneTouch Delica Lancets 30G MISC 1 each by Does not apply route in the morning, at noon, and at bedtime. Use onetouch delica lancets to check blood sugar 3 times daily. 100 each 2   rosuvastatin (CRESTOR) 40 MG tablet Take 1 tablet (40 mg total) by mouth daily. 90 tablet 1   TOUJEO SOLOSTAR 300 UNIT/ML Solostar Pen Inject 20 Units into the skin in the morning and at bedtime. 30 mL 4   traZODone (DESYREL) 50 MG tablet Take 1 tablet (50 mg total) by mouth at bedtime as needed for sleep. 90 tablet 1   Vitamin D, Ergocalciferol, (DRISDOL) 50000 units CAPS capsule Take 50,000 Units by mouth every 7 (seven) days.     benzonatate (TESSALON) 100 MG capsule Take 1 capsule (100 mg total) by mouth every 8 (eight) hours. (Patient not taking: Reported on 01/06/2024) 21 capsule 0   ezetimibe (ZETIA) 10 MG tablet Take 1 tablet (10 mg total) by mouth daily. 90 tablet 3   metoprolol tartrate (LOPRESSOR) 25 MG tablet Take 1 tablet (25 mg total) by mouth 2 (two) times daily. 180 tablet 3   predniSONE (DELTASONE) 20 MG tablet Take 1 tablet (20 mg total) by mouth daily with breakfast. (Patient not taking: Reported on 01/06/2024) 5 tablet 0   No facility-administered medications prior to visit.     ROS Review of Systems  Constitutional:  Negative for activity change and appetite change.  HENT:  Positive for postnasal drip. Negative for sinus pressure and sore throat.   Respiratory:  Positive for cough. Negative for chest tightness, shortness of breath and wheezing.   Cardiovascular:  Negative for chest pain and palpitations.  Gastrointestinal:  Negative for abdominal distention, abdominal pain and constipation.  Genitourinary: Negative.   Musculoskeletal: Negative.   Psychiatric/Behavioral:  Negative for behavioral problems and  dysphoric mood.     Objective:  BP (!) 153/78   Pulse 73   Ht 5\' 4"  (1.626 m)   Wt 111 lb (50.3 kg)   SpO2 99%   BMI 19.05 kg/m      01/06/2024   11:36 AM 12/18/2023   10:24 AM 10/15/2023    1:47 PM  BP/Weight  Systolic BP 153 126 118  Diastolic BP 78 80 70  Wt. (Lbs) 111 110 109  BMI 19.05 kg/m2 18.88 kg/m2 18.71 kg/m2      Physical Exam Constitutional:      Appearance: She is well-developed.  Cardiovascular:     Rate and Rhythm: Normal rate.     Heart sounds: Normal heart sounds. No murmur  heard. Pulmonary:     Effort: Pulmonary effort is normal.     Breath sounds: Normal breath sounds. No wheezing or rales.  Chest:     Chest wall: No tenderness.  Abdominal:     General: Bowel sounds are normal. There is no distension.     Palpations: Abdomen is soft. There is no mass.     Tenderness: There is no abdominal tenderness.  Musculoskeletal:        General: Normal range of motion.     Right lower leg: No edema.     Left lower leg: No edema.  Neurological:     Mental Status: She is alert and oriented to person, place, and time.  Psychiatric:        Mood and Affect: Mood normal.        Latest Ref Rng & Units 09/05/2023   10:00 AM 08/07/2023    2:35 PM 08/01/2023    8:44 PM  CMP  Glucose 70 - 99 mg/dL 161  096  045   BUN 6 - 23 mg/dL 14  22  21    Creatinine 0.40 - 1.20 mg/dL 4.09  8.11  9.14   Sodium 135 - 145 mEq/L 140  137  140   Potassium 3.5 - 5.1 mEq/L 4.4  4.5  3.7   Chloride 96 - 112 mEq/L 103  100  109   CO2 19 - 32 mEq/L 29  19  20    Calcium 8.4 - 10.5 mg/dL 9.7  9.5  8.6   Total Protein 6.0 - 8.5 g/dL  6.7    Total Bilirubin 0.0 - 1.2 mg/dL  <7.8    Alkaline Phos 44 - 121 IU/L  158    AST 0 - 40 IU/L  56    ALT 0 - 32 IU/L  49      Lipid Panel     Component Value Date/Time   CHOL 290 (H) 08/07/2023 1435   TRIG 415 (H) 08/07/2023 1435   HDL 48 08/07/2023 1435   CHOLHDL 6.0 (H) 08/07/2023 1435   CHOLHDL 4 11/20/2021 1036   VLDL 37.0  11/20/2021 1036   LDLCALC 162 (H) 08/07/2023 1435   LDLDIRECT 98.0 06/03/2023 1052    CBC    Component Value Date/Time   WBC 9.3 08/01/2023 1620   RBC 4.02 08/01/2023 1620   HGB 13.3 08/01/2023 1642   HCT 39.0 08/01/2023 1642   PLT 363 08/01/2023 1620   MCV 94.8 08/01/2023 1620   MCH 30.3 08/01/2023 1620   MCHC 32.0 08/01/2023 1620   RDW 15.8 (H) 08/01/2023 1620   LYMPHSABS 1.8 08/01/2023 1620   MONOABS 0.5 08/01/2023 1620   EOSABS 0.0 08/01/2023 1620   BASOSABS 0.1 08/01/2023 1620    Lab Results  Component Value Date   HGBA1C 10.2 (A) 12/18/2023       Assessment & Plan Hypertension Blood pressure remains elevated despite medication.  - Recheck blood pressure before departure still reveals elevated blood pressure but surprisingly at her endocrine visit her blood pressure was controlled. -I will see her back in 1 month to reassess her blood pressure regimen and if still uncontrolled adjust her regimen - Counseled on blood pressure goal of less than 130/80, low-sodium, DASH diet, medication compliance, 150 minutes of moderate intensity exercise per week. Discussed medication compliance, adverse effects. - Follow up in one month.   Elevated LFT -Will recheck levels today.   Peripheral diabetic neuropathy Persistent burning thigh pain despite current medication regimen. -  Increase gabapentin to 600 mg twice daily.  Chronic Cough Chronic productive cough likely sinus-related. Not currently using prescribed medications. - Prescribe Flonase. - Prescribe Zyrtec. - Evaluate response to medications.  Type 1 Diabetes Mellitus -Uncontrolled with A1c of 10.2 Managed by endocrinologist.  Hyperlipidemia High cholesterol levels necessitate fasting lipid panel for accurate assessment. - Order fasting lipid panel. -Continue Crestor and Zetia - Return fasting for blood draw.      No orders of the defined types were placed in this encounter.   Follow-up: No  follow-ups on file.       Hoy Register, MD, FAAFP. [facility] [place], [place] [phone]   01/06/2024, 11:37 AM

## 2024-01-06 NOTE — Patient Instructions (Signed)
Cmo controlar su hipertensin Managing Your Hypertension La hipertensin, tambin conocida como presin arterial alta, se produce cuando la sangre ejerce presin contra las paredes de las arterias con demasiada fuerza. Las arterias son los vasos sanguneos que transportan la sangre desde el corazn hacia todas las partes del cuerpo. La hipertensin hace que el corazn haga ms esfuerzo para bombear la sangre y puede provocar que las arterias se estrechen o endurezcan. Qu significan las lecturas de la presin arterial Una lectura de la presin arterial consta de un nmero ms alto sobre un nmero ms bajo. El primer nmero, o nmero superior, es la presin sistlica. Es la medida de la presin de las arterias cuando el corazn late. El segundo nmero, o nmero inferior, es la presin diastlica. Es la medida de la presin en las arterias cuando el corazn se relaja. Para la mayora de las personas, una presin arterial normal est por debajo de 120/80. La presin arterial deseada puede variar en funcin de las enfermedades, la edad y otros factores personales. La presin arterial se clasifica en cuatro etapas. Sobre la base de la lectura de su presin arterial, el mdico puede usar las siguientes etapas para determinar si necesita tratamiento y de qu tipo. La presin sistlica y la presin diastlica se miden en una unidad llamada milmetros de mercurio (mm Hg). Normal Presin sistlica: por debajo de 120. Presin diastlica: por debajo de 80. Elevada Presin sistlica: 120-129. Presin diastlica: por debajo de 80. Etapa 1 de hipertensin Presin sistlica: 130-139. Presin diastlica: 80-89. Etapa 2 de hipertensin Presin sistlica: 140 o ms. Presin diastlica: 90 o ms. Cmo puede afectarme esta enfermedad? Controlar la hipertensin es muy importante. Con el transcurso del tiempo, la hipertensin puede daar las arterias y disminuir el flujo de sangre hacia partes del cuerpo que  incluyen el cerebro, el corazn y los riones. Tener hipertensin no tratada o no controlada puede causar: Infarto de miocardio. Accidente cerebrovascular. Debilitamiento de los vasos sanguneos (aneurisma). Insuficiencia cardaca. Dao renal. Dao ocular. Problemas de memoria y concentracin. Demencia vascular. Qu medidas puedo tomar para controlar esta afeccin? La hipertensin se puede controlar haciendo cambios en el estilo de vida y, posiblemente, tomando medicamentos. Su mdico le ayudar a crear un plan para bajar la presin arterial al rango normal. Es posible que lo deriven para que reciba asesoramiento sobre una dieta saludable y actividad fsica. Nutricin  Siga una dieta con alto contenido de fibras y potasio, y con bajo contenido de sal (sodio), azcar agregada y grasas. Un ejemplo de plan de alimentacin se denomina dieta DASH. DASH es la sigla en ingls de "Enfoques alimentarios para detener la hipertensin". Para alimentarse de esta manera: Coma mucha fruta y verdura fresca. Trate de que la mitad del plato de cada comida sea de frutas y verduras. Coma cereales integrales, como pasta integral, arroz integral o pan integral. Llene aproximadamente un cuarto del plato con cereales integrales. Consuma productos lcteos descremados. Evite la ingesta de cortes de carne grasa, carne procesada o curada, y carne de ave con piel. Llene aproximadamente un cuarto del plato con protenas magras, como pescado, pollo sin piel, frijoles, huevos o tofu. Evite ingerir alimentos prehechos y procesados. En general, estos tienen mayor cantidad de sodio, azcar agregada y grasa. Reduzca su ingesta diaria de sodio. Muchas personas que tienen hipertensin deben comer menos de 1500 mg de sodio por da. Estilo de vida  Trabaje con su mdico para mantener un peso saludable o perder peso. Pregntele cul es el peso recomendado   para usted. Realice al menos 30 minutos de ejercicio que haga que se acelere  su corazn (ejercicio aerbico) la mayora de los das de la semana. Estas actividades pueden incluir caminar, nadar o andar en bicicleta. Incluya ejercicios para fortalecer sus msculos (ejercicios de resistencia), como levantamiento de pesas, como parte de su rutina semanal de ejercicios. Intente realizar 30 minutos de este tipo de ejercicios al menos tres das a la semana. No consuma ningn producto que contenga nicotina o tabaco. Estos productos incluyen cigarrillos, tabaco para mascar y aparatos de vapeo, como los cigarrillos electrnicos. Si necesita ayuda para dejar de consumir estos productos, consulte al mdico. Controle las enfermedades a largo plazo (crnicas), como el colesterol alto o la diabetes. Identifique sus causas de estrs y encuentre maneras de controlar el estrs. Esto puede incluir meditacin, respiracin profunda o hacerse tiempo para realizar actividades divertidas. Consumo de alcohol No beba alcohol si: Su mdico le indica no hacerlo. Est embarazada, puede estar embarazada o est tratando de quedar embarazada. Si bebe alcohol: Limite la cantidad que bebe a lo siguiente: De 0 a 1 medida por da para las mujeres. De 0 a 2 medidas por da para los hombres. Sepa cunta cantidad de alcohol hay en las bebidas que toma. En los [place], una medida equivale a una botella de cerveza de 12 oz (355 ml), un vaso de vino de 5 oz (148 ml) o un vaso de una bebida alcohlica de alta graduacin de 1 oz (44 ml). Medicamentos El mdico puede recetarle medicamentos si los cambios en el estilo de vida no son suficientes para lograr controlar la presin arterial y si: Su presin arterial sistlica es de 130 o ms. Su presin arterial diastlica es de 80 o ms. Use los medicamentos solamente como se lo haya indicado el mdico. Siga cuidadosamente las indicaciones. Los medicamentos para la presin arterial deben tomarse como se lo haya indicado el mdico. Los medicamentos pierden eficacia  al omitir las dosis. El hecho de omitir las dosis tambin aumenta el riesgo de otros problemas. Monitoreo Antes de controlarse la presin arterial: No fume, no consuma bebidas con cafena ni haga ejercicio dentro de los 30 minutos antes de tomar la medicin. Vaya al bao y vace la vejiga (orine). Permanezca sentado tranquilamente durante al menos 5 minutos antes de tomar las mediciones. Contrlese la presin arterial en su casa segn las indicaciones del mdico. Para hacer esto: Sintese con la espalda recta y con apoyo. Coloque los pies planos en el piso. No se cruce de piernas. Apoye el brazo sobre una superficie plana, como una mesa. Asegrese de que la parte superior del brazo est al nivel del corazn. Cada vez que tome una medicin, tome dos o tres lecturas con un minuto de separacin y anote los resultados. Posiblemente tambin sea necesario que el mdico le controle la presin arterial de manera regular. Informacin general Hable con su mdico acerca de la dieta, hbitos de ejercicio y otros factores del estilo de vida que pueden contribuir a la hipertensin. Revise con su mdico todos los medicamentos que toma ya que puede haber efectos secundarios o interacciones. Concurra a todas las visitas de seguimiento. El mdico puede ayudarle a crear y ajustar su plan para controlar la presin arterial alta. Dnde obtener ms informacin National Heart, Lung, and Blood Institute (Instituto Nacional del Corazn, los Pulmones y la Sangre): www.nhlbi.nih.gov American Heart Association (Asociacin Estadounidense del Corazn): www.heart.org Comunquese con un mdico si: Piensa que tiene una reaccin alrgica a los medicamentos   que ha tomado. Tiene dolores de cabeza frecuentes (recurrentes). Siente mareos. Tiene hinchazn en los tobillos. Tiene problemas de visin. Solicite ayuda de inmediato si: Siente un dolor de cabeza intenso o confusin. Siente debilidad inusual, adormecimiento o que se  desmayar. Siente un dolor intenso en el pecho o el abdomen. Vomita repetidas veces. Tiene dificultad para respirar. Estos sntomas pueden indicar una emergencia. Solicite ayuda de inmediato. Llame al 911. No espere a ver si los sntomas desaparecen. No conduzca por sus propios medios hasta el hospital. Resumen La hipertensin se produce cuando la sangre bombea en las arterias con mucha fuerza. Si esta afeccin no se controla, podra correr riesgo de tener complicaciones graves. La presin arterial deseada puede variar en funcin de las enfermedades, la edad y otros factores personales. Para la mayora de las personas, una presin arterial normal es menor que 120/80. La hipertensin se puede controlar mediante cambios en el estilo de vida, tomando medicamentos, o ambas cosas. Los cambios en el estilo de vida para ayudar a controlar la hipertensin incluyen prdida de peso, seguir una dieta saludable, con bajo contenido de sodio, hacer ms ejercicio, dejar de fumar y limitar el consumo de alcohol. Esta informacin no tiene como fin reemplazar el consejo del mdico. Asegrese de hacerle al mdico cualquier pregunta que tenga. Document Revised: 07/31/2021 Document Reviewed: 07/31/2021 Elsevier Patient Education  2024 Elsevier Inc.  

## 2024-01-07 ENCOUNTER — Ambulatory Visit: Attending: Family Medicine

## 2024-01-07 DIAGNOSIS — R7989 Other specified abnormal findings of blood chemistry: Secondary | ICD-10-CM

## 2024-01-07 DIAGNOSIS — E1069 Type 1 diabetes mellitus with other specified complication: Secondary | ICD-10-CM

## 2024-01-07 DIAGNOSIS — Z0001 Encounter for general adult medical examination with abnormal findings: Secondary | ICD-10-CM

## 2024-01-09 LAB — LP+NON-HDL CHOLESTEROL
Cholesterol, Total: 137 mg/dL (ref 100–199)
HDL: 53 mg/dL (ref >39)
LDL Chol Calc (NIH): 59 mg/dL (ref 0–99)
Total Non-HDL-Chol (LDL+VLDL): 84 mg/dL (ref 0–129)
Triglycerides: 145 mg/dL (ref 0–149)
VLDL Cholesterol Cal: 25 mg/dL (ref 5–40)

## 2024-01-09 LAB — CMP14+EGFR
ALT: 16 IU/L (ref 0–32)
AST: 21 IU/L (ref 0–40)
Albumin: 4.3 g/dL (ref 3.8–4.9)
Alkaline Phosphatase: 111 IU/L (ref 44–121)
BUN/Creatinine Ratio: 16 (ref 9–23)
BUN: 13 mg/dL (ref 6–24)
Bilirubin Total: 0.3 mg/dL (ref 0.0–1.2)
CO2: 25 mmol/L (ref 20–29)
Calcium: 9.8 mg/dL (ref 8.7–10.2)
Chloride: 106 mmol/L (ref 96–106)
Creatinine, Ser: 0.83 mg/dL (ref 0.57–1.00)
Globulin, Total: 2.4 g/dL (ref 1.5–4.5)
Glucose: 155 mg/dL — ABNORMAL HIGH (ref 70–99)
Potassium: 4.5 mmol/L (ref 3.5–5.2)
Sodium: 146 mmol/L — ABNORMAL HIGH (ref 134–144)
Total Protein: 6.7 g/dL (ref 6.0–8.5)
eGFR: 82 mL/min/{1.73_m2} (ref >59)

## 2024-01-09 LAB — MICROALBUMIN / CREATININE URINE RATIO
Creatinine, Urine: 95.6 mg/dL
Microalb/Creat Ratio: 26 mg/g{creat} (ref 0–29)
Microalbumin, Urine: 24.8 ug/mL

## 2024-01-09 LAB — GAMMA GT: GGT: 8 IU/L (ref 0–60)

## 2024-01-10 ENCOUNTER — Encounter: Payer: Self-pay | Admitting: Family Medicine

## 2024-01-14 ENCOUNTER — Other Ambulatory Visit: Payer: Self-pay | Admitting: Family Medicine

## 2024-01-14 DIAGNOSIS — M79652 Pain in left thigh: Secondary | ICD-10-CM

## 2024-02-18 ENCOUNTER — Ambulatory Visit: Admitting: Family Medicine

## 2024-02-19 ENCOUNTER — Telehealth: Payer: Self-pay

## 2024-02-19 NOTE — Telephone Encounter (Signed)
 complete

## 2024-03-11 ENCOUNTER — Other Ambulatory Visit: Payer: Self-pay | Admitting: Family Medicine

## 2024-03-17 ENCOUNTER — Ambulatory Visit: Payer: BLUE CROSS/BLUE SHIELD | Admitting: Endocrinology

## 2024-03-17 ENCOUNTER — Encounter: Payer: Self-pay | Admitting: Endocrinology

## 2024-03-17 ENCOUNTER — Ambulatory Visit: Payer: Self-pay | Admitting: Endocrinology

## 2024-03-17 VITALS — BP 138/70 | HR 95 | Resp 20 | Ht 64.0 in | Wt 112.6 lb

## 2024-03-17 DIAGNOSIS — E1065 Type 1 diabetes mellitus with hyperglycemia: Secondary | ICD-10-CM

## 2024-03-17 LAB — POCT GLYCOSYLATED HEMOGLOBIN (HGB A1C): Hemoglobin A1C: 9.6 % — AB (ref 4.0–5.6)

## 2024-03-17 MED ORDER — TOUJEO SOLOSTAR 300 UNIT/ML ~~LOC~~ SOPN
18.0000 [IU] | PEN_INJECTOR | Freq: Two times a day (BID) | SUBCUTANEOUS | 4 refills | Status: AC
Start: 2024-03-17 — End: ?

## 2024-03-17 NOTE — Progress Notes (Signed)
 Outpatient Endocrinology Note Gabrielle Granville Whitefield, MD  03/17/24  Patient's Name: Gabrielle Waller    DOB: 12-Jul-1966    MRN: [id number]                                                    REASON OF VISIT: Follow up of type 1 diabetes mellitus  PCP: Joaquin Mulberry, MD  HISTORY OF PRESENT ILLNESS:   Gabrielle CRISPI is a 58 y.o. old female with past medical history listed below, is here for follow up for type 1 diabetes mellitus.   Pertinent Diabetes History: Patient was diagnosed with type 1 diabetes mellitus in 2001.  Patient has uncontrolled type 1 diabetes mellitus.  Diabetes management has challenges with cost of medication and compliance.  She sometimes has difficulty understanding instructions for day-to-day management for diabetes.  She has labile blood sugars.  Chronic Diabetes Complications : Retinopathy: Yes. Last ophthalmology exam was done on annually at [facility], following with ophthalmology regularly.  Nephropathy: no, on ACE/ARB /telmisartan  Peripheral neuropathy: yes, feet pain, no numbness, on gabapentin . Coronary artery disease: no Stroke: no  Relevant comorbidities and cardiovascular risk factors: Obesity: no Body mass index is 19.33 kg/m.  Hypertension: Yes  Hyperlipidemia : Yes, on statin   Current / Home Diabetic regimen includes: Toujeo  20 units in the morning and  20 units in the evening. NovoLog  mostly 15 units with meals, 2 times a day.  Sometimes it takes NovoLog  10 units only.  Prior diabetic medications: Novolin R was stopped in November 2024.  Glycemic data:   One Touch Verio flex glucometer data from April 29 to Mar 17, 2024 reviewed, average blood sugar 147.  Highest blood sugar 268 and lowest 59.  She has been checking blood sugar occasionally at different times of the day fasting, in the afternoon, at bedtime..  She has random hyperglycemia with blood sugar 182, 268, 189, 250.  Some of the blood sugar normal 130, 121.  She had occasional  hypoglycemia with blood sugar 59 around bedtime, 68, 60 overnight.  She has variable blood sugar.  CGM was planned in the past was not cost effective.  Hypoglycemia: Patient has minor hypoglycemic episodes. Patient has hypoglycemia awareness.  Factors modifying glucose control: 1.  Diabetic diet assessment: 2 meals a day.  2.  Staying active or exercising: No formal exercise.  3.  Medication compliance: compliant some of the time.  # She has a prior history of hyperthyroidism treated with Tapazole in 2001.  TSH level is back to normal.  She had mildly low TSH in October 2024.  Currently managed by primary care provider.    Interval history  Diabetes regimen is reviewed and noted above.  She reports taking NovoLog  before eating, she sometimes takes NovoLog  10 units if blood sugar is low at the time of eating, usually she takes NovoLog  15 units.  Denies hypoglycemic symptoms.  Glucometer data as reviewed above with mostly hyperglycemia and occasional hypoglycemia.  Hemoglobin A1c improved to 9.6% today.  No other complaints today.  REVIEW OF SYSTEMS As per history of present illness.   PAST MEDICAL HISTORY: Past Medical History:  Diagnosis Date   Diabetes mellitus without complication (HCC)    Kidney stones     PAST SURGICAL HISTORY: Past Surgical History:  Procedure Laterality Date   APPENDECTOMY  ALLERGIES: Allergies  Allergen Reactions   Penicillins Itching    FAMILY HISTORY:  Family History  Problem Relation Age of Onset   Diabetes Mother     SOCIAL HISTORY: Social History   Socioeconomic History   Marital status: Married    Spouse name: Not on file   Number of children: 4   Years of education: 12   Highest education level: Not on file  Occupational History   Occupation: Cleaning  Tobacco Use   Smoking status: Never   Smokeless tobacco: Never  Vaping Use   Vaping status: Never Used  Substance and Sexual Activity   Alcohol use: No   Drug use: No    Sexual activity: Not on file  Other Topics Concern   Not on file  Social History Narrative   Fun: walks, listening to music   Denies abuse and feels safe at home.   Social Drivers of Corporate investment banker Strain: Not on file  Food Insecurity: Not on file  Transportation Needs: Not on file  Physical Activity: Not on file  Stress: Not on file  Social Connections: Not on file    MEDICATIONS:  Current Outpatient Medications  Medication Sig Dispense Refill   acetaminophen  (TYLENOL ) 325 MG tablet Take 650 mg by mouth every 6 (six) hours as needed.     cetirizine  (ZYRTEC ) 10 MG tablet Take 1 tablet (10 mg total) by mouth daily. 90 tablet 1   Continuous Glucose Receiver (DEXCOM G7 RECEIVER) DEVI To display CGM data 1 each 0   Continuous Glucose Sensor (FREESTYLE LIBRE 3 SENSOR) MISC 1 Device by Does not apply route every 14 (fourteen) days. Apply 1 sensor on upper arm every 14 days for continuous glucose monitoring 2 each 2   DULoxetine  (CYMBALTA ) 60 MG capsule TAKE 1 CAPSULE(60 MG) BY MOUTH DAILY FOR CHRONIC PAIN 90 capsule 0   famotidine  (PEPCID ) 20 MG tablet Take 1 tablet (20 mg total) by mouth 2 (two) times daily. 30 tablet 0   fluticasone  (FLONASE ) 50 MCG/ACT nasal spray SHAKE LIQUID AND USE 2 SPRAYS IN EACH NOSTRIL DAILY 48 g 0   gabapentin  (NEURONTIN ) 300 MG capsule Take 2 capsules (600 mg total) by mouth at bedtime. 180 capsule 1   Glucagon  (BAQSIMI  ONE PACK) 3 MG/DOSE POWD Use in nostril for sever low sugar 1 each 1   glucose blood (ONETOUCH VERIO) test strip USE 1 STRIP TO CHECK GLUCOSE THREE TIMES DAILY 300 each 1   insulin  aspart (NOVOLOG  FLEXPEN) 100 UNIT/ML FlexPen Take 18 units with meals 2-3 times a day. 30 mL 4   Insulin  Syringe-Needle U-100 (INSULIN  SYRINGE .5CC/31GX5/16") 31G X 5/16" 0.5 ML MISC Use to inject insulin  90 each 3   meloxicam  (MOBIC ) 7.5 MG tablet Take 1 tablet (7.5 mg total) by mouth daily as needed for pain. 30 tablet 0   OneTouch Delica Lancets 30G  MISC 1 each by Does not apply route in the morning, at noon, and at bedtime. Use onetouch delica lancets to check blood sugar 3 times daily. 100 each 2   predniSONE  (DELTASONE ) 20 MG tablet Take 1 tablet (20 mg total) by mouth daily with breakfast. 5 tablet 0   rosuvastatin  (CRESTOR ) 40 MG tablet Take 1 tablet (40 mg total) by mouth daily. 90 tablet 1   traZODone  (DESYREL ) 50 MG tablet Take 1 tablet (50 mg total) by mouth at bedtime as needed for sleep. 90 tablet 1   Vitamin D, Ergocalciferol, (DRISDOL) 50000 units CAPS capsule Take  50,000 Units by mouth every 7 (seven) days.     benzonatate  (TESSALON ) 100 MG capsule Take 1 capsule (100 mg total) by mouth every 8 (eight) hours. (Patient not taking: Reported on 03/17/2024) 21 capsule 0   ezetimibe  (ZETIA ) 10 MG tablet Take 1 tablet (10 mg total) by mouth daily. 90 tablet 3   metoprolol  tartrate (LOPRESSOR ) 25 MG tablet Take 1 tablet (25 mg total) by mouth 2 (two) times daily. 180 tablet 3   TOUJEO  SOLOSTAR 300 UNIT/ML Solostar Pen Inject 18 Units into the skin in the morning and at bedtime. 30 mL 4   No current facility-administered medications for this visit.    PHYSICAL EXAM: Vitals:   03/17/24 0909  BP: 138/70  Pulse: 95  Resp: 20  SpO2: 95%  Weight: 112 lb 9.6 oz (51.1 kg)  Height: 5\' 4"  (1.626 m)     Body mass index is 19.33 kg/m.  Wt Readings from Last 3 Encounters:  03/17/24 112 lb 9.6 oz (51.1 kg)  01/06/24 111 lb (50.3 kg)  12/18/23 110 lb (49.9 kg)    General: Well developed, well nourished female in no apparent distress.  HEENT: AT/[place], no external lesions.  Eyes: Conjunctiva clear and no icterus. Neck: Neck supple  Lungs: Respirations not labored Neurologic: Alert, oriented, normal speech Extremities / Skin: Dry.  Psychiatric: Does not appear depressed or anxious  Diabetic Foot Exam - Simple   No data filed    LABS Reviewed Lab Results  Component Value Date   HGBA1C 9.6 (A) 03/17/2024   HGBA1C 10.2 (A)  12/18/2023   HGBA1C 9.3 (H) 09/05/2023   Lab Results  Component Value Date   FRUCTOSAMINE 375 (H) 11/09/2016   FRUCTOSAMINE 323 (H) 07/01/2015   FRUCTOSAMINE 381 (H) 02/10/2015   Lab Results  Component Value Date   CHOL 137 01/07/2024   HDL 53 01/07/2024   LDLCALC 59 01/07/2024   LDLDIRECT 98.0 06/03/2023   TRIG 145 01/07/2024   CHOLHDL 6.0 (H) 08/07/2023   Lab Results  Component Value Date   MICRALBCREAT 26 01/07/2024   MICRALBCREAT 18.9 06/03/2023   Lab Results  Component Value Date   CREATININE 0.83 01/07/2024   Lab Results  Component Value Date   GFR 78.40 09/05/2023    ASSESSMENT / PLAN  1. Uncontrolled type 1 diabetes mellitus with hyperglycemia (HCC)    Diabetes Mellitus type 1, complicated by diabetic retinopathy and neuropathy. - Diabetic status / severity: Uncontrolled.  Improving.  Lab Results  Component Value Date   HGBA1C 9.6 (A) 03/17/2024    - Hemoglobin A1c goal : <7%  Discussed about importance of controlling diabetes mellitus.  Discussed about compliance with monitoring blood sugar and also all the insulins.  She reports compliance with taking Toujeo  and NovoLog .  Glucometer data as reviewed above.  Due to occasional hypoglycemia especially overnight I would like to decrease the dose of basal insulin  Toujeo .  He has hyperglycemia related to meals I would like to increase the dose of NovoLog .  Adjusted diabetes regimen as follows.  Patient has labile blood sugars.  Patient is asked to take NovoLog  before eating.  Asked to avoid snacks and high carbohydrate meals.  - Medications: See below.  -Decrease Toujeo  from 20 units 2 times a day to 18 units 2 times a day. -Increase NovoLog  from 15 units to 18 units in the morning food and from 15 unit to 18 units with evening meal.  Discussed about taking less dose of NovoLog  in case of  smaller meal or blood sugar on the lower side at the time of eating can take 10 units of NovoLog  in those conditions.   She can adjust her NovoLog  between 10 to 18 units for meal.  - Home glucose testing: Before meals and at bedtime and as needed.  Asked to check more often before each meal and at bedtime.  - Discussed/ Gave Hypoglycemia treatment plan.  Patient has glucagon  emergency kit at home.  # Consult : not required at this time.   # Annual urine for microalbuminuria/ creatinine ratio, no microalbuminuria currently, continue ACE/ARB /telmisartan . Last  Lab Results  Component Value Date   MICRALBCREAT 26 01/07/2024    # Foot check nightly / neuropathy, continue gabapentin .  # She has diabetic retinopathy, following with ophthalmology.  - Diet: Make healthy diabetic food choices - Life style / activity / exercise: Discussed.  2. Blood pressure  -  BP Readings from Last 1 Encounters:  03/17/24 138/70    - Control is in target.  - No change in current plans.  3. Lipid status / Hyperlipidemia - Last  Lab Results  Component Value Date   LDLCALC 59 01/07/2024   - Continue rosuvastatin  40 mg daily.  Zetia  10 mg daily.  Managed by primary care provider.  # Noted to have mildly low TSH in October 2024, will consider to repeat thyroid  function test with next set of lab.  Being monitored by primary care provider.  Diagnoses and all orders for this visit:  Uncontrolled type 1 diabetes mellitus with hyperglycemia (HCC) -     POCT glycosylated hemoglobin (Hb A1C) -     TOUJEO  SOLOSTAR 300 UNIT/ML Solostar Pen; Inject 18 Units into the skin in the morning and at bedtime.    DISPOSITION Follow up in clinic in 3 months suggested.   All questions answered and patient verbalized understanding of the plan.  Gabrielle Kyera Felan, MD [facility] Endocrinology [facility] [address] [place], [address] [place], [place] [address] Phone # [phone]  At least part of this note was generated using voice recognition software. Inadvertent word errors may have occurred, which were not recognized  during the proofreading process.

## 2024-03-17 NOTE — Patient Instructions (Signed)
 Diabetes regimen:  Toujeo  18 units two times a day.  Novolog  18 units in the morning food and 18 units with evening meal. Take this insulin  10-15 minutes before eating. Okay to take less novolog  if small meal or glucose is at the time of eating.

## 2024-03-23 ENCOUNTER — Encounter: Payer: Self-pay | Admitting: Cardiology

## 2024-04-13 ENCOUNTER — Other Ambulatory Visit: Payer: Self-pay

## 2024-04-13 DIAGNOSIS — E1065 Type 1 diabetes mellitus with hyperglycemia: Secondary | ICD-10-CM

## 2024-04-13 MED ORDER — ONETOUCH VERIO VI STRP
ORAL_STRIP | 1 refills | Status: AC
Start: 2024-04-13 — End: ?

## 2024-05-20 ENCOUNTER — Telehealth: Payer: Self-pay

## 2024-05-20 NOTE — Telephone Encounter (Signed)
 Orthotics have been thrown away/ recycled 10/30/2022-10/31/2023 Balance: $0 Attempted to contact 1x

## 2024-06-18 ENCOUNTER — Encounter: Payer: Self-pay | Admitting: Endocrinology

## 2024-06-18 ENCOUNTER — Ambulatory Visit: Admitting: Endocrinology

## 2024-06-18 ENCOUNTER — Ambulatory Visit: Payer: Self-pay | Admitting: Endocrinology

## 2024-06-18 VITALS — BP 122/78 | HR 92 | Resp 20 | Ht 64.0 in | Wt 113.4 lb

## 2024-06-18 DIAGNOSIS — E1065 Type 1 diabetes mellitus with hyperglycemia: Secondary | ICD-10-CM

## 2024-06-18 LAB — POCT GLYCOSYLATED HEMOGLOBIN (HGB A1C): Hemoglobin A1C: 10.2 % — AB (ref 4.0–5.6)

## 2024-06-18 NOTE — Progress Notes (Signed)
 Outpatient Endocrinology Note Gabrielle Jarred Purtee, MD  06/18/24  Patient's Name: Gabrielle Waller    DOB: [birth year]    MRN: [id number]                                                    REASON OF VISIT: Follow up of type 1 diabetes mellitus  PCP: Delbert Clam, MD  HISTORY OF PRESENT ILLNESS:   Gabrielle Waller is a 58 y.o. old female with past medical history listed below, is here for follow up for type 1 diabetes mellitus.   Pertinent Diabetes History: Patient was diagnosed with type 1 diabetes mellitus in 2001.  Patient has uncontrolled type 1 diabetes mellitus.  Diabetes management has challenges with cost of medication and compliance.  She sometimes has difficulty understanding instructions for day-to-day management for diabetes.  She has labile blood sugars.  Chronic Diabetes Complications : Retinopathy: Yes. Last ophthalmology exam was done on annually at [facility], following with ophthalmology regularly.  Nephropathy: no, on ACE/ARB /telmisartan  Peripheral neuropathy: yes, feet pain, no numbness, on gabapentin . Coronary artery disease: no Stroke: no  Relevant comorbidities and cardiovascular risk factors: Obesity: no Body mass index is 19.47 kg/m.  Hypertension: Yes  Hyperlipidemia : Yes, on statin   Current / Home Diabetic regimen includes: Toujeo  18 units in the morning and  18 units in the evening. NovoLog  mostly 18 units with meals, 2-3 times a day.  Sometimes it takes NovoLog  10 units only.  Taking after eating.  Prior diabetic medications: Novolin R was stopped in November 2024.  Glycemic data:   One Touch Verio flex glucometer data from July 31 to August 14 , 2025 reviewed, average blood sugar 320.  She has been checking blood sugar rarely at bedtime some of the blood sugar 464, 253, 244.  CGM was planned in the past was not cost effective.  Hypoglycemia: Patient has ? hypoglycemic episodes. Patient has hypoglycemia awareness.  Factors modifying glucose  control: 1.  Diabetic diet assessment: 2 meals a day.  2.  Staying active or exercising: No formal exercise.  3.  Medication compliance: compliant some of the time.  # She has a prior history of hyperthyroidism treated with Tapazole in 2001.  TSH level is back to normal.  She had mildly low TSH in October 2024.  Currently managed by primary care provider.    Interval history  Not much glucose data to review.  She has been checking blood sugars rarely.  Diabetes regimen as reviewed above.  She reports compliance with taking both Toujeo  and NovoLog .  She however taking NovoLog  after eating.  Hemoglobin A1c worsened to 10.2% today.  She complains of occasional sciatic pain in the left side otherwise no complaints today.  Advised to discuss with primary care provider regarding her sciatic pain.  Patient is accompanied by Spanish language interpreter in person.  REVIEW OF SYSTEMS As per history of present illness.   PAST MEDICAL HISTORY: Past Medical History:  Diagnosis Date   Diabetes mellitus without complication (HCC)    Kidney stones     PAST SURGICAL HISTORY: Past Surgical History:  Procedure Laterality Date   APPENDECTOMY      ALLERGIES: Allergies  Allergen Reactions   Penicillins Itching    FAMILY HISTORY:  Family History  Problem Relation Age of Onset   Diabetes  Mother     SOCIAL HISTORY: Social History   Socioeconomic History   Marital status: Married    Spouse name: Not on file   Number of children: 4   Years of education: 12   Highest education level: Not on file  Occupational History   Occupation: Cleaning  Tobacco Use   Smoking status: Never   Smokeless tobacco: Never  Vaping Use   Vaping status: Never Used  Substance and Sexual Activity   Alcohol use: No   Drug use: No   Sexual activity: Not on file  Other Topics Concern   Not on file  Social History Narrative   Fun: walks, listening to music   Denies abuse and feels safe at home.   Social  Drivers of Corporate investment banker Strain: Not on file  Food Insecurity: Not on file  Transportation Needs: Not on file  Physical Activity: Not on file  Stress: Not on file  Social Connections: Not on file    MEDICATIONS:  Current Outpatient Medications  Medication Sig Dispense Refill   acetaminophen  (TYLENOL ) 325 MG tablet Take 650 mg by mouth every 6 (six) hours as needed.     cetirizine  (ZYRTEC ) 10 MG tablet Take 1 tablet (10 mg total) by mouth daily. 90 tablet 1   gabapentin  (NEURONTIN ) 300 MG capsule Take 2 capsules (600 mg total) by mouth at bedtime. 180 capsule 1   Glucagon  (BAQSIMI  ONE PACK) 3 MG/DOSE POWD Use in nostril for sever low sugar 1 each 1   glucose blood (ONETOUCH VERIO) test strip USE 1 STRIP TO CHECK GLUCOSE THREE TIMES DAILY 300 each 1   insulin  aspart (NOVOLOG  FLEXPEN) 100 UNIT/ML FlexPen Take 18 units with meals 2-3 times a day. 30 mL 4   Insulin  Syringe-Needle U-100 (INSULIN  SYRINGE .5CC/31GX5/16) 31G X 5/16 0.5 ML MISC Use to inject insulin  90 each 3   OneTouch Delica Lancets 30G MISC 1 each by Does not apply route in the morning, at noon, and at bedtime. Use onetouch delica lancets to check blood sugar 3 times daily. 100 each 2   rosuvastatin  (CRESTOR ) 40 MG tablet Take 1 tablet (40 mg total) by mouth daily. 90 tablet 1   TOUJEO  SOLOSTAR 300 UNIT/ML Solostar Pen Inject 18 Units into the skin in the morning and at bedtime. 30 mL 4   Vitamin D, Ergocalciferol, (DRISDOL) 50000 units CAPS capsule Take 50,000 Units by mouth every 7 (seven) days.     famotidine  (PEPCID ) 20 MG tablet Take 1 tablet (20 mg total) by mouth 2 (two) times daily. (Patient not taking: Reported on 06/18/2024) 30 tablet 0   fluticasone  (FLONASE ) 50 MCG/ACT nasal spray SHAKE LIQUID AND USE 2 SPRAYS IN EACH NOSTRIL DAILY (Patient not taking: Reported on 06/18/2024) 48 g 0   meloxicam  (MOBIC ) 7.5 MG tablet Take 1 tablet (7.5 mg total) by mouth daily as needed for pain. (Patient not taking:  Reported on 06/18/2024) 30 tablet 0   metoprolol  tartrate (LOPRESSOR ) 25 MG tablet Take 1 tablet (25 mg total) by mouth 2 (two) times daily. 180 tablet 3   predniSONE  (DELTASONE ) 20 MG tablet Take 1 tablet (20 mg total) by mouth daily with breakfast. (Patient not taking: Reported on 06/18/2024) 5 tablet 0   traZODone  (DESYREL ) 50 MG tablet Take 1 tablet (50 mg total) by mouth at bedtime as needed for sleep. (Patient not taking: Reported on 06/18/2024) 90 tablet 1   No current facility-administered medications for this visit.    PHYSICAL  EXAM: Vitals:   06/18/24 0856  BP: 122/78  Pulse: 92  Resp: 20  SpO2: 97%  Weight: 113 lb 6.4 oz (51.4 kg)  Height: 5' 4 (1.626 m)     Body mass index is 19.47 kg/m.  Wt Readings from Last 3 Encounters:  06/18/24 113 lb 6.4 oz (51.4 kg)  03/17/24 112 lb 9.6 oz (51.1 kg)  01/06/24 111 lb (50.3 kg)    General: Well developed, well nourished female in no apparent distress.  HEENT: AT/[place], no external lesions.  Eyes: Conjunctiva clear and no icterus. Neck: Neck supple  Lungs: Respirations not labored Neurologic: Alert, oriented, normal speech Extremities / Skin: Dry.  Psychiatric: Does not appear depressed or anxious  Diabetic Foot Exam - Simple   Simple Foot Form Diabetic Foot exam was performed with the following findings: Yes 06/18/2024  9:18 AM  Visual Inspection See comments: Yes Sensation Testing See comments: Yes Pulse Check Posterior Tibialis and Dorsalis pulse intact bilaterally: Yes Comments Right great dystrophic nail. Monofilament exam right intact, left mildly diminished on toes.     LABS Reviewed Lab Results  Component Value Date   HGBA1C 10.2 (A) 06/18/2024   HGBA1C 9.6 (A) 03/17/2024   HGBA1C 10.2 (A) 12/18/2023   Lab Results  Component Value Date   FRUCTOSAMINE 375 (H) 11/09/2016   FRUCTOSAMINE 323 (H) 07/01/2015   FRUCTOSAMINE 381 (H) 02/10/2015   Lab Results  Component Value Date   CHOL 137 01/07/2024   HDL  53 01/07/2024   LDLCALC 59 01/07/2024   LDLDIRECT 98.0 06/03/2023   TRIG 145 01/07/2024   CHOLHDL 6.0 (H) 08/07/2023   Lab Results  Component Value Date   MICRALBCREAT 26 01/07/2024   Lab Results  Component Value Date   CREATININE 0.83 01/07/2024   Lab Results  Component Value Date   GFR 78.40 09/05/2023    ASSESSMENT / PLAN  1. Uncontrolled type 1 diabetes mellitus with hyperglycemia (HCC)     Diabetes Mellitus type 1, complicated by diabetic retinopathy and neuropathy. - Diabetic status / severity: Uncontrolled.  Worsening.  Lab Results  Component Value Date   HGBA1C 10.2 (A) 06/18/2024    - Hemoglobin A1c goal : <7%  Not much glucose data to review.  She has been checking blood sugar frequently at bedtime only.  Patient has labile blood sugars.  Patient is asked to take NovoLog  before eating.  Asked to avoid snacks and high carbohydrate meals.  Patient confirms understanding with Spanish interpreter.  - Medications: See below.  No changes today.  She is relatively on high dose of insulin .  Emphasized on taking NovoLog  before eating.  Continue Toujeo  18 units 2 times a day. Continue NovoLog  18 units with meals 2-3 times a day.  Discussed about taking less dose of NovoLog  in case of smaller meal or blood sugar on the lower side at the time of eating can take 10 units of NovoLog  in those conditions.  She can adjust her NovoLog  between 10 to 18 units for meal.  - Home glucose testing: Before meals and at bedtime and as needed.  Asked to check more often before each meal and at bedtime.  - Discussed/ Gave Hypoglycemia treatment plan.  Patient has glucagon  emergency kit at home.  # Consult : not required at this time.   # Annual urine for microalbuminuria/ creatinine ratio, no microalbuminuria currently, continue ACE/ARB /telmisartan . Last  Lab Results  Component Value Date   MICRALBCREAT 26 01/07/2024    # Foot check  nightly / neuropathy, continue  gabapentin .  # She has diabetic retinopathy, following with ophthalmology.  - Diet: Make healthy diabetic food choices - Life style / activity / exercise: Discussed.  2. Blood pressure  -  BP Readings from Last 1 Encounters:  06/18/24 122/78    - Control is in target.  - No change in current plans.  3. Lipid status / Hyperlipidemia - Last  Lab Results  Component Value Date   LDLCALC 59 01/07/2024   - Continue rosuvastatin  40 mg daily.  Zetia  10 mg daily.  Managed by primary care provider.  # Noted to have mildly low TSH in October 2024, will consider to repeat thyroid  function test with next set of lab.  Being monitored by primary care provider.  Diagnoses and all orders for this visit:  Uncontrolled type 1 diabetes mellitus with hyperglycemia (HCC) -     POCT glycosylated hemoglobin (Hb A1C)   DISPOSITION Follow up in clinic in 3 months suggested.   All questions answered and patient verbalized understanding of the plan.  Gabrielle Teneisha Gignac, MD [facility] Endocrinology [facility] [address] [place], [address] [place], [place] [address] Phone # [phone]  At least part of this note was generated using voice recognition software. Inadvertent word errors may have occurred, which were not recognized during the proofreading process.

## 2024-06-19 ENCOUNTER — Encounter: Payer: Self-pay | Admitting: Cardiology

## 2024-07-31 ENCOUNTER — Other Ambulatory Visit: Payer: Self-pay | Admitting: Family Medicine

## 2024-07-31 DIAGNOSIS — E1069 Type 1 diabetes mellitus with other specified complication: Secondary | ICD-10-CM

## 2024-07-31 NOTE — Telephone Encounter (Signed)
 Requested Prescriptions  Pending Prescriptions Disp Refills   rosuvastatin  (CRESTOR ) 40 MG tablet [Pharmacy Med Name: ROSUVASTATIN  40MG  TABLETS] 90 tablet 0    Sig: TAKE 1 TABLET(40 MG) BY MOUTH DAILY     Cardiovascular:  Antilipid - Statins 2 Failed - 07/31/2024  4:17 PM      Failed - Lipid Panel in normal range within the last 12 months    Cholesterol, Total  Date Value Ref Range Status  01/07/2024 137 100 - 199 mg/dL Final   LDL Chol Calc (NIH)  Date Value Ref Range Status  01/07/2024 59 0 - 99 mg/dL Final   Direct LDL  Date Value Ref Range Status  06/03/2023 98.0 mg/dL Final    Comment:    Optimal:  <100 mg/dLNear or Above Optimal:  100-129 mg/dLBorderline High:  130-159 mg/dLHigh:  160-189 mg/dLVery High:  >190 mg/dL   HDL  Date Value Ref Range Status  01/07/2024 53 >39 mg/dL Final   Triglycerides  Date Value Ref Range Status  01/07/2024 145 0 - 149 mg/dL Final         Passed - Cr in normal range and within 360 days    Creatinine, Ser  Date Value Ref Range Status  01/07/2024 0.83 0.57 - 1.00 mg/dL Final         Passed - Patient is not pregnant      Passed - Valid encounter within last 12 months    Recent Outpatient Visits           6 months ago Type 1 diabetes mellitus with other specified complication (HCC)   [facility] [place] Delbert Clam, MD   1 year ago Other cough   [facility] Of [place]. [facility] Delbert Clam, MD   1 year ago Screening for cervical cancer   [facility] Of [place]. [facility] Delbert Clam, MD   1 year ago Pain of left thigh   [facility] A Dept Of [place]. [facility] Delbert Clam, MD   1 year ago Other insomnia   [facility] A Dept Of [place]. [facility] Delbert Clam, MD

## 2024-08-30 ENCOUNTER — Other Ambulatory Visit: Payer: Self-pay | Admitting: Family Medicine

## 2024-08-30 DIAGNOSIS — E1042 Type 1 diabetes mellitus with diabetic polyneuropathy: Secondary | ICD-10-CM

## 2024-09-21 ENCOUNTER — Ambulatory Visit: Payer: Self-pay | Admitting: Endocrinology

## 2024-09-21 ENCOUNTER — Encounter: Payer: Self-pay | Admitting: Endocrinology

## 2024-09-21 ENCOUNTER — Ambulatory Visit: Admitting: Endocrinology

## 2024-09-21 ENCOUNTER — Other Ambulatory Visit

## 2024-09-21 VITALS — BP 124/66 | HR 88 | Resp 16 | Ht 64.0 in | Wt 113.0 lb

## 2024-09-21 DIAGNOSIS — E1065 Type 1 diabetes mellitus with hyperglycemia: Secondary | ICD-10-CM | POA: Diagnosis not present

## 2024-09-21 DIAGNOSIS — R7989 Other specified abnormal findings of blood chemistry: Secondary | ICD-10-CM | POA: Diagnosis not present

## 2024-09-21 DIAGNOSIS — E0590 Thyrotoxicosis, unspecified without thyrotoxic crisis or storm: Secondary | ICD-10-CM

## 2024-09-21 LAB — POCT GLYCOSYLATED HEMOGLOBIN (HGB A1C): Hemoglobin A1C: 10 % — AB (ref 4.0–5.6)

## 2024-09-21 NOTE — Progress Notes (Addendum)
 Outpatient Endocrinology Note Ayerim Berquist, MD  09/21/24  Patient's Name: Gabrielle Waller    DOB: 1965/12/21    MRN: [id number]                                                    REASON OF VISIT: Follow up of type 1 diabetes mellitus  PCP: Delbert Clam, MD  HISTORY OF PRESENT ILLNESS:   Gabrielle Waller is a 58 y.o. old female with past medical history listed below, is here for follow up for type 1 diabetes mellitus. Low TSH.   Pertinent Diabetes History: Patient was diagnosed with type 1 diabetes mellitus in 2001.  Patient has uncontrolled type 1 diabetes mellitus.  Diabetes management has challenges with cost of medication and compliance.  She sometimes has difficulty understanding instructions for day-to-day management for diabetes.  She has labile blood sugars.  Chronic Diabetes Complications : Retinopathy: Yes. Last ophthalmology exam was done on annually at [facility], following with ophthalmology regularly.  Nephropathy: no, on ACE/ARB /telmisartan  Peripheral neuropathy: yes, feet pain, no numbness, on gabapentin . Coronary artery disease: no Stroke: no  Relevant comorbidities and cardiovascular risk factors: Obesity: no Body mass index is 19.4 kg/m.  Hypertension: Yes  Hyperlipidemia : Yes, on statin   Current / Home Diabetic regimen includes: Toujeo  18 units in the morning and  18 units in the evening. NovoLog  mostly 18 units with meals, 2-3 times a day.  Sometimes it takes NovoLog  10 units only.  Reports taking before eating.  Prior diabetic medications: Novolin R was stopped in November 2024.  Glycemic data:   One Touch Verio flex glucometer data.  She forgot to bring glucometer in the clinic today.  She reports blood sugar variable 200 range, 147, 88.  Denies hypoglycemia.    CGM was planned in the past was not cost effective.  Hypoglycemia: Patient has ? hypoglycemic episodes. Patient has hypoglycemia awareness.  Factors modifying glucose control: 1.   Diabetic diet assessment: 2 meals a day.  2.  Staying active or exercising: No formal exercise.  3.  Medication compliance: compliant some of the time.  # She has a prior history of hyperthyroidism treated with Tapazole in 2001.  TSH level was back to normal.  She had mildly low TSH intermittently, was last time in October 2024, TSH was 0.366.   No thyroid  imagings done in the past available to review.  Interval history  She forgot to bring glucometer today.  Hemoglobin A1c 10%, slightly improved.  Diabetes is been as reviewed as noted above.  She reports he mostly takes both insulins 18 units usually 2 times a day each.  Denies hypoglycemic symptoms.  She reports she is taking insulin  before eating.  She denies palpitation and heat intolerance.  She sometimes felt throat irritation and something stuck in the throat.  No increased sweating.  She had intermittently mildly low TSH in the past.  No other complaints today.  REVIEW OF SYSTEMS As per history of present illness.   PAST MEDICAL HISTORY: Past Medical History:  Diagnosis Date   Diabetes mellitus without complication (HCC)    Kidney stones     PAST SURGICAL HISTORY: Past Surgical History:  Procedure Laterality Date   APPENDECTOMY      ALLERGIES: Allergies  Allergen Reactions   Penicillins Itching  FAMILY HISTORY:  Family History  Problem Relation Age of Onset   Diabetes Mother     SOCIAL HISTORY: Social History   Socioeconomic History   Marital status: Married    Spouse name: Not on file   Number of children: 4   Years of education: 12   Highest education level: Not on file  Occupational History   Occupation: Cleaning  Tobacco Use   Smoking status: Never   Smokeless tobacco: Never  Vaping Use   Vaping status: Never Used  Substance and Sexual Activity   Alcohol use: No   Drug use: No   Sexual activity: Not on file  Other Topics Concern   Not on file  Social History Narrative   Fun: walks,  listening to music   Denies abuse and feels safe at home.   Social Drivers of Corporate Investment Banker Strain: Not on file  Food Insecurity: Not on file  Transportation Needs: Not on file  Physical Activity: Not on file  Stress: Not on file  Social Connections: Not on file    MEDICATIONS:  Current Outpatient Medications  Medication Sig Dispense Refill   acetaminophen  (TYLENOL ) 325 MG tablet Take 650 mg by mouth every 6 (six) hours as needed.     cetirizine  (ZYRTEC ) 10 MG tablet Take 1 tablet (10 mg total) by mouth daily. 90 tablet 1   famotidine  (PEPCID ) 20 MG tablet Take 1 tablet (20 mg total) by mouth 2 (two) times daily. 30 tablet 0   fluticasone  (FLONASE ) 50 MCG/ACT nasal spray SHAKE LIQUID AND USE 2 SPRAYS IN EACH NOSTRIL DAILY 48 g 0   gabapentin  (NEURONTIN ) 300 MG capsule TAKE 2 CAPSULES(600 MG) BY MOUTH AT BEDTIME 180 capsule 1   Glucagon  (BAQSIMI  ONE PACK) 3 MG/DOSE POWD Use in nostril for sever low sugar 1 each 1   glucose blood (ONETOUCH VERIO) test strip USE 1 STRIP TO CHECK GLUCOSE THREE TIMES DAILY 300 each 1   insulin  aspart (NOVOLOG  FLEXPEN) 100 UNIT/ML FlexPen Take 18 units with meals 2-3 times a day. 30 mL 4   Insulin  Syringe-Needle U-100 (INSULIN  SYRINGE .5CC/31GX5/16) 31G X 5/16 0.5 ML MISC Use to inject insulin  90 each 3   meloxicam  (MOBIC ) 7.5 MG tablet Take 1 tablet (7.5 mg total) by mouth daily as needed for pain. 30 tablet 0   metoprolol  tartrate (LOPRESSOR ) 25 MG tablet Take 1 tablet (25 mg total) by mouth 2 (two) times daily. 180 tablet 3   OneTouch Delica Lancets 30G MISC 1 each by Does not apply route in the morning, at noon, and at bedtime. Use onetouch delica lancets to check blood sugar 3 times daily. 100 each 2   predniSONE  (DELTASONE ) 20 MG tablet Take 1 tablet (20 mg total) by mouth daily with breakfast. 5 tablet 0   rosuvastatin  (CRESTOR ) 40 MG tablet TAKE 1 TABLET(40 MG) BY MOUTH DAILY 90 tablet 0   TOUJEO  SOLOSTAR 300 UNIT/ML Solostar Pen  Inject 18 Units into the skin in the morning and at bedtime. 30 mL 4   traZODone  (DESYREL ) 50 MG tablet Take 1 tablet (50 mg total) by mouth at bedtime as needed for sleep. 90 tablet 1   Vitamin D, Ergocalciferol, (DRISDOL) 50000 units CAPS capsule Take 50,000 Units by mouth every 7 (seven) days.     No current facility-administered medications for this visit.    PHYSICAL EXAM: Vitals:   09/21/24 1008  BP: 124/66  Pulse: 88  Resp: 16  SpO2: 97%  Weight:  113 lb (51.3 kg)  Height: 5' 4 (1.626 m)      Body mass index is 19.4 kg/m.  Wt Readings from Last 3 Encounters:  09/21/24 113 lb (51.3 kg)  06/18/24 113 lb 6.4 oz (51.4 kg)  03/17/24 112 lb 9.6 oz (51.1 kg)    General: Well developed, well nourished female in no apparent distress.  HEENT: AT/[place], no external lesions.  Eyes: Conjunctiva clear and no icterus. Neck: Neck supple, mild thyromegaly + Lungs: Respirations not labored Neurologic: Alert, oriented, normal speech, DTR 2+ Extremities / Skin: No pedal edema.  No hand tremors. Psychiatric: Does not appear depressed or anxious  Diabetic Foot Exam - Simple   No data filed    LABS Reviewed Lab Results  Component Value Date   HGBA1C 10.0 (A) 09/21/2024   HGBA1C 10.2 (A) 06/18/2024   HGBA1C 9.6 (A) 03/17/2024   Lab Results  Component Value Date   FRUCTOSAMINE 375 (H) 11/09/2016   FRUCTOSAMINE 323 (H) 07/01/2015   FRUCTOSAMINE 381 (H) 02/10/2015   Lab Results  Component Value Date   CHOL 137 01/07/2024   HDL 53 01/07/2024   LDLCALC 59 01/07/2024   LDLDIRECT 98.0 06/03/2023   TRIG 145 01/07/2024   CHOLHDL 6.0 (H) 08/07/2023   Lab Results  Component Value Date   MICRALBCREAT 26 01/07/2024   Lab Results  Component Value Date   CREATININE 0.83 01/07/2024   Lab Results  Component Value Date   GFR 78.40 09/05/2023    ASSESSMENT / PLAN  1. Uncontrolled type 1 diabetes mellitus with hyperglycemia (HCC)   2. Abnormal TSH   3. Subclinical  hyperthyroidism     Diabetes Mellitus type 1, complicated by diabetic retinopathy and neuropathy. - Diabetic status / severity: Uncontrolled.   Lab Results  Component Value Date   HGBA1C 10.0 (A) 09/21/2024    - Hemoglobin A1c goal : <7%  Not much glucose data to review.   Patient has labile blood sugars.  Patient is asked to take NovoLog  before eating.  Asked to avoid snacks and high carbohydrate meals.    - Medications: See below.  No changes today.  She is relatively on high dose of insulin .  Again emphasized on taking NovoLog  before eating.  Continue Toujeo  18 units 2 times a day. Continue NovoLog  18 units with meals 2-3 times a day.  Asked to take before eating.  Discussed about taking less dose of NovoLog  in case of smaller meal or blood sugar on the lower side at the time of eating can take 10 units of NovoLog  in those conditions.  She can adjust her NovoLog  between 10 to 18 units for meal.  - Home glucose testing: Before meals and at bedtime and as needed.  Asked to check more often before each meal and at bedtime.  Asked to bring glucometer in the follow-up visit.  - Discussed/ Gave Hypoglycemia treatment plan.  Patient has glucagon  emergency kit at home.  # Consult : not required at this time.   # Annual urine for microalbuminuria/ creatinine ratio, no microalbuminuria currently, continue ACE/ARB /telmisartan . Last  Lab Results  Component Value Date   MICRALBCREAT 26 01/07/2024    # Foot check nightly / neuropathy, continue gabapentin .  # She has diabetic retinopathy, following with ophthalmology.  - Diet: Make healthy diabetic food choices - Life style / activity / exercise: Discussed.  2. Blood pressure  -  BP Readings from Last 1 Encounters:  09/21/24 124/66    - Control is  in target.  - No change in current plans.  3. Lipid status / Hyperlipidemia - Last  Lab Results  Component Value Date   LDLCALC 59 01/07/2024   - Continue rosuvastatin  40 mg  daily.  Zetia  10 mg daily.  Managed by primary care provider.  # Low TSH -Patient has history of hyperthyroidism, was treated with methimazole long time ago, 2001.  Lately she is having intermittently mildly low TSH.  TSH was 0.366 in October 2024.  She complains of occasional neck discomfort and something stuck in the throat. -She is clinically euthyroid. -Check thyroid  function test along with thyroid  autoantibodies for hyperthyroidism including TRAb and TSI. -If she continues to have low TSH, will consider ultrasound thyroid .  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  She has no hyperthyroid symptoms.   Will continue to monitor thyroid  function test at this time.  If she remained persistently low TSH with symptoms we will plan for antithyroid medication.   Latest Reference Range & Units 09/21/24 10:35  TSH 0.40 - 4.50 mIU/L 0.31 (L)  Triiodothyronine,Free,Serum 2.3 - 4.2 pg/mL 3.0  T4,Free(Direct) 0.8 - 1.8 ng/dL 0.9  Thyroid  stimulating immunoglobulin  Rpt  TSI <140 % baseline <89  (L): Data is abnormally low Rpt: View report in Results Review for more information   Latest Reference Range & Units 09/21/24 10:35  TRAB <=2.00 IU/L 16.20 (H)  (H): Data is abnormally high  DISPOSITION Follow up in clinic in 3 months suggested.  Labs today as ordered.   All questions answered and patient verbalized understanding of  the plan.  Cresencia Asmus, MD [facility] Endocrinology [facility] [address] [place], [address] [place], [place] [address] Phone # [phone]  At least part of this note was generated using voice recognition software. Inadvertent word errors may have occurred, which were not recognized during the proofreading process.

## 2024-09-24 LAB — T4, FREE: Free T4: 0.9 ng/dL (ref 0.8–1.8)

## 2024-09-24 LAB — THYROID STIMULATING IMMUNOGLOBULIN: TSI: <89 %{baseline} (ref <140)

## 2024-09-24 LAB — TRAB (TSH RECEPTOR BINDING ANTIBODY): TRAB: 16.2 IU/L — ABNORMAL HIGH (ref <=2.00)

## 2024-09-24 LAB — T3, FREE: T3, Free: 3 pg/mL (ref 2.3–4.2)

## 2024-09-24 LAB — TSH: TSH: 0.31 m[IU]/L — ABNORMAL LOW (ref 0.40–4.50)

## 2024-09-29 ENCOUNTER — Other Ambulatory Visit: Payer: Self-pay | Admitting: Cardiology

## 2024-10-03 ENCOUNTER — Other Ambulatory Visit: Payer: Self-pay | Admitting: Endocrinology

## 2024-10-03 DIAGNOSIS — E1065 Type 1 diabetes mellitus with hyperglycemia: Secondary | ICD-10-CM

## 2024-10-05 ENCOUNTER — Other Ambulatory Visit: Payer: Self-pay | Admitting: Cardiology

## 2024-10-23 ENCOUNTER — Ambulatory Visit: Admission: EM | Admit: 2024-10-23 | Discharge: 2024-10-23 | Disposition: A | Discharge status: Home / Self Care

## 2024-10-23 ENCOUNTER — Encounter: Payer: Self-pay | Admitting: *Deleted

## 2024-10-23 DIAGNOSIS — J029 Acute pharyngitis, unspecified: Secondary | ICD-10-CM | POA: Diagnosis not present

## 2024-10-23 DIAGNOSIS — K2990 Gastroduodenitis, unspecified, without bleeding: Secondary | ICD-10-CM | POA: Insufficient documentation

## 2024-10-23 DIAGNOSIS — R634 Abnormal weight loss: Secondary | ICD-10-CM | POA: Insufficient documentation

## 2024-10-23 DIAGNOSIS — K2970 Gastritis, unspecified, without bleeding: Secondary | ICD-10-CM | POA: Insufficient documentation

## 2024-10-23 DIAGNOSIS — E1065 Type 1 diabetes mellitus with hyperglycemia: Secondary | ICD-10-CM | POA: Insufficient documentation

## 2024-10-23 DIAGNOSIS — R1310 Dysphagia, unspecified: Secondary | ICD-10-CM | POA: Insufficient documentation

## 2024-10-23 LAB — POCT RAPID STREP A (OFFICE): Rapid Strep A Screen: NEGATIVE

## 2024-10-23 MED ORDER — DEXAMETHASONE 6 MG PO TABS
10.0000 mg | ORAL_TABLET | Freq: Once | ORAL | Status: AC
  Administered 2024-10-23: 10 mg via ORAL

## 2024-10-23 NOTE — ED Provider Notes (Signed)
 " [facility]    CSN: [id number] Arrival date & time: 10/23/24  1318      History   Chief Complaint Chief Complaint  Patient presents with   Sore Throat    HPI Gabrielle Waller is a 58 y.o. female.  Patient with past history significant for type 1 diabetes, hypothyroidism, presents to urgent care today with concerns of a sore throat.  Reports that she has been experiencing sore throat, tonsillar swelling, and difficulty swallowing due to pain for the last day or so.  She believes that her symptoms started after she went outside in the cold.  Has been taking Tylenol  ibuprofen with minimal improvement in symptoms.  No reported fever, chills, or bodyaches.  Denies any nausea, vomiting, or diarrhea.  Spanish interpreter: Curlee [phone]  Sore Throat    Past Medical History:  Diagnosis Date   Diabetes mellitus without complication (HCC)    Kidney stones     Patient Active Problem List   Diagnosis Date Noted   Abnormal weight loss 10/23/2024   Gastritis, unspecified, without bleeding 10/23/2024   Dysphagia 10/23/2024   Gastroduodenitis 10/23/2024   Hyperglycemia due to type 1 diabetes mellitus (HCC) 10/23/2024   Allergic rhinitis 03/08/2016   Health care maintenance 03/08/2016   Elevated blood pressure 03/08/2016   Subclinical hyperthyroidism 07/16/2013   Mixed hyperlipidemia 07/14/2013   Type 1 diabetes mellitus (HCC) 07/10/2013    Past Surgical History:  Procedure Laterality Date   APPENDECTOMY      OB History   No obstetric history on file.      Home Medications    Prior to Admission medications  Medication Sig Start Date End Date Taking? Authorizing Provider  DULoxetine  (CYMBALTA ) 60 MG capsule Take 60 mg by mouth daily. 09/24/24  Yes [provider]  gabapentin  (NEURONTIN ) 300 MG capsule TAKE 2 CAPSULES(600 MG) BY MOUTH AT BEDTIME 09/01/24  Yes Newlin, Enobong, MD  Insulin  Aspart FlexPen (NOVOLOG ) 100 UNIT/ML ADMINISTER 18 UNITS UNDER THE  SKIN 2 TO 3 TIMES A DAY WITH MEALS 10/05/24  Yes Thapa, Sudan, MD  metoprolol  tartrate (LOPRESSOR ) 25 MG tablet TAKE 1 TABLET(25 MG) BY MOUTH TWICE DAILY 10/05/24  Yes Kate Lonni CROME, MD  rosuvastatin  (CRESTOR ) 40 MG tablet TAKE 1 TABLET(40 MG) BY MOUTH DAILY 07/31/24  Yes Newlin, Enobong, MD  TOUJEO  SOLOSTAR 300 UNIT/ML Solostar Pen Inject 18 Units into the skin in the morning and at bedtime. 03/17/24  Yes Thapa, Sudan, MD  acetaminophen  (TYLENOL ) 325 MG tablet Take 650 mg by mouth every 6 (six) hours as needed.    [provider]  cetirizine  (ZYRTEC ) 10 MG tablet Take 1 tablet (10 mg total) by mouth daily. 01/06/24   Newlin, Enobong, MD  famotidine  (PEPCID ) 20 MG tablet Take 1 tablet (20 mg total) by mouth 2 (two) times daily. 10/03/20   Mario Million, MD  fluticasone  (FLONASE ) 50 MCG/ACT nasal spray SHAKE LIQUID AND USE 2 SPRAYS IN EACH NOSTRIL DAILY 03/11/24   Newlin, Enobong, MD  Glucagon  (BAQSIMI  ONE PACK) 3 MG/DOSE POWD Use in nostril for sever low sugar 05/22/21   [place], MD  glucose blood (ONETOUCH VERIO) test strip USE 1 STRIP TO CHECK GLUCOSE THREE TIMES DAILY 04/13/24   Thapa, Sudan, MD  Insulin  Syringe-Needle U-100 (INSULIN  SYRINGE .5CC/31GX5/16) 31G X 5/16 0.5 ML MISC Use to inject insulin  08/21/21   [place], MD  meloxicam  (MOBIC ) 7.5 MG tablet Take 1 tablet (7.5 mg total) by mouth daily as needed for pain.  06/05/21   Gershon Donnice SAUNDERS, DPM  OneTouch Delica Lancets 30G MISC 1 each by Does not apply route in the morning, at noon, and at bedtime. Use onetouch delica lancets to check blood sugar 3 times daily. 02/04/20   [place], MD  predniSONE  (DELTASONE ) 20 MG tablet Take 1 tablet (20 mg total) by mouth daily with breakfast. 07/17/23   Delbert Clam, MD  traZODone  (DESYREL ) 50 MG tablet Take 1 tablet (50 mg total) by mouth at bedtime as needed for sleep. 01/17/23   Newlin, Enobong, MD  Vitamin D, Ergocalciferol, (DRISDOL) 50000 units CAPS capsule Take 50,000 Units by  mouth every 7 (seven) days.    [provider]    Family History Family History  Problem Relation Age of Onset   Diabetes Mother     Social History Social History[1]   Allergies   Penicillins   Review of Systems Review of Systems  HENT:  Positive for sore throat.   All other systems reviewed and are negative.    Physical Exam Triage Vital Signs ED Triage Vitals  Encounter Vitals Group     BP 10/23/24 1454 122/79     Girls Systolic BP Percentile --      Girls Diastolic BP Percentile --      Boys Systolic BP Percentile --      Boys Diastolic BP Percentile --      Pulse Rate 10/23/24 1454 82     Resp 10/23/24 1454 18     Temp 10/23/24 1454 98.3 F (36.8 C)     Temp Source 10/23/24 1454 Oral     SpO2 10/23/24 1454 95 %     Weight --      Height --      Head Circumference --      Peak Flow --      Pain Score 10/23/24 1450 5     Pain Loc --      Pain Education --      Exclude from Growth Chart --    No data found.  Updated Vital Signs BP 122/79 (BP Location: Left Arm)   Pulse 82   Temp 98.3 F (36.8 C) (Oral)   Resp 18   SpO2 95%   Visual Acuity Right Eye Distance:   Left Eye Distance:   Bilateral Distance:    Right Eye Near:   Left Eye Near:    Bilateral Near:     Physical Exam Vitals and nursing note reviewed.  Constitutional:      General: She is not in acute distress.    Appearance: She is well-developed.  HENT:     Head: Normocephalic and atraumatic.     Mouth/Throat:     Mouth: No oral lesions.     Pharynx: Uvula midline. Posterior oropharyngeal erythema present. No pharyngeal swelling, oropharyngeal exudate or uvula swelling.     Comments: No appreciable tonsillar swelling or exudate.  The oropharynx is slightly erythematous. Eyes:     Conjunctiva/sclera: Conjunctivae normal.  Cardiovascular:     Rate and Rhythm: Normal rate and regular rhythm.     Heart sounds: No murmur heard. Pulmonary:     Effort: Pulmonary effort is  normal. No respiratory distress.     Breath sounds: Normal breath sounds.  Abdominal:     Palpations: Abdomen is soft.     Tenderness: There is no abdominal tenderness.  Musculoskeletal:        General: No swelling.     Cervical back: Neck supple.  Skin:    General: Skin is warm and dry.     Capillary Refill: Capillary refill takes less than 2 seconds.  Neurological:     Mental Status: She is alert.  Psychiatric:        Mood and Affect: Mood normal.      UC Treatments / Results  Labs (all labs ordered are listed, but only abnormal results are displayed) Labs Reviewed  POCT RAPID STREP A (OFFICE) - Normal    EKG   Radiology No results found.  Procedures Procedures (including critical care time)  Medications Ordered in UC Medications  dexamethasone (DECADRON) tablet 10 mg (has no administration in time range)    Initial Impression / Assessment and Plan / UC Course  I have reviewed the triage vital signs and the nursing notes.  Pertinent labs & imaging results that were available during my care of the patient were reviewed by me and considered in my medical decision making (see chart for details).     This patient presents to the UC for concern of sore throat.  Differential diagnosis includes strep pharyngitis, viral URI, viral pharyngitis    Additional history obtained:  Additional history obtained from chart review   Lab Tests:  I Ordered, and personally interpreted labs.  The pertinent results include: Rapid strep negative   Problem List / UC Course:  Patient presents to urgent care today with concerns of a sore throat.  Reports that this has been ongoing for the last day or so without any obvious cough, congestion, fever, or bodyaches.  Denies any nausea, vomiting.  No clear sick contacts as far she can recall. On exam, there is appreciable erythema in the oropharynx but no tonsillar swelling or exudate.  Uvula is midline.  There is some anterior  cervical chain lymphadenopathy. Given exam findings, added on strep test for further evaluation.  Lack of cough, congestion, makes viral URI less likely although viral pharyngitis not fully excluded. Rapid strep is negative.  Suspect symptoms are likely due to viral pharyngitis.  Will minister a one-time dose of Decadron here to help with sore throat.  Advise continue symptom management at home for the next 2 days.  Return precautions discussed.  She is otherwise stable for outpatient follow-up and discharged home.   Social Determinants of Health:  None  Final Clinical Impressions(s) / UC Diagnoses   Final diagnoses:  Acute viral pharyngitis     Discharge Instructions      [place] acudi a la clnica de urgencias por dolor de advertising copywriter. La prueba de estreptococos dio negativa, lo que descarta una infeccin bacteriana como causa de sus sntomas. Sospecho que la infeccin es de origen viral y que se beneficiar de educational psychologist controlando los sntomas durante los prximos 4 [place]. Se le administr una dosis nica de esteroides antes del alta. Asegrese de tomar Tylenol  e ibuprofeno segn sea necesario para el dolor o los dolores corporales. Si presenta sntomas nuevos o un empeoramiento de los sntomas, regrese a la clnica de urgencias o acuda al servicio de urgencias si no puede ser atendido chief of staff. Comunquese con su mdico de cabecera para una cita de seguimiento dentro de las prximas 1 a 2 semanas.  You were seen in urgent care today for concerns of a sore throat.  You were negative for strep which is a bacterial cause of your symptoms.  I suspect you likely have a viral source of your infection and would benefit from continued symptom control over the next  4 days.  You are given a one-time dose of steroids here prior to being discharged.  Make sure you take Tylenol  and ibuprofen as needed for pain or bodyaches.  For any concerns of new or worsening symptoms, return to urgent care or seek  evaluation at the emergency department if you are unable to be seen elsewhere.  Reach out to your primary care provider for a follow-up visit within the next 1 to 2 weeks.     ED Prescriptions   None    PDMP not reviewed this encounter.     [1]  Social History Tobacco Use   Smoking status: Never   Smokeless tobacco: Never  Vaping Use   Vaping status: Never Used  Substance Use Topics   Alcohol use: No   Drug use: No     Morgen Linebaugh A, PA-C 10/23/24 1607  "

## 2024-10-23 NOTE — ED Triage Notes (Signed)
 Spanish interpreter used for clinical intake: Curlee [phone]  I came in because my tonsils are swollen and I am having difficulty swallowing saliva I also have a cough. States she went outside yesterday and it was cold and that's when her symptoms started. She is taking tylenol  and ibuprofen, she took both today around 1pm.

## 2024-10-23 NOTE — Discharge Instructions (Addendum)
 [place] acudi a la clnica de urgencias por dolor de advertising copywriter. La prueba de estreptococos dio negativa, lo que descarta una infeccin bacteriana como causa de sus sntomas. Sospecho que la infeccin es de origen viral y que se beneficiar de educational psychologist controlando los sntomas durante los prximos 4 [place]. Se le administr una dosis nica de esteroides antes del alta. Asegrese de tomar Tylenol  e ibuprofeno segn sea necesario para el dolor o los dolores corporales. Si presenta sntomas nuevos o un empeoramiento de los sntomas, regrese a la clnica de urgencias o acuda al servicio de urgencias si no puede ser atendido chief of staff. Comunquese con su mdico de cabecera para una cita de seguimiento dentro de las prximas 1 a 2 semanas.  You were seen in urgent care today for concerns of a sore throat.  You were negative for strep which is a bacterial cause of your symptoms.  I suspect you likely have a viral source of your infection and would benefit from continued symptom control over the next 4 days.  You are given a one-time dose of steroids here prior to being discharged.  Make sure you take Tylenol  and ibuprofen as needed for pain or bodyaches.  For any concerns of new or worsening symptoms, return to urgent care or seek evaluation at the emergency department if you are unable to be seen elsewhere.  Reach out to your primary care provider for a follow-up visit within the next 1 to 2 weeks.

## 2024-11-02 ENCOUNTER — Encounter: Payer: Self-pay | Admitting: Internal Medicine

## 2024-11-02 ENCOUNTER — Ambulatory Visit: Attending: Internal Medicine | Admitting: Internal Medicine

## 2024-11-02 VITALS — BP 130/70 | HR 83 | Ht 64.0 in | Wt 115.6 lb

## 2024-11-02 DIAGNOSIS — G4489 Other headache syndrome: Secondary | ICD-10-CM

## 2024-11-02 DIAGNOSIS — J069 Acute upper respiratory infection, unspecified: Secondary | ICD-10-CM | POA: Diagnosis not present

## 2024-11-02 MED ORDER — BENZONATATE 100 MG PO CAPS: 100.0000 mg | ORAL_CAPSULE | Freq: Two times a day (BID) | ORAL | 0 refills | Status: AC | PRN

## 2024-11-02 NOTE — Patient Instructions (Signed)
" °  VISIT SUMMARY: Today, you were seen for a cough and nasal congestion that you have been experiencing for the past three weeks. You also reported headaches that occur when you cough. Your rapid strep test was negative, and you have been taking ibuprofen for symptom relief.  YOUR PLAN: -ACUTE NASOPHARYNGITIS (COMMON COLD): You have a common cold, which is likely caused by a virus. Your symptoms include a cough with green phlegm, nasal congestion, and headaches when coughing. You have been prescribed Tessalon  Perles to help with your cough, and you can use Vicks Vaporub for nasal congestion. For your headaches, you can take Tylenol  as needed. If your headaches persist after your cough and congestion have resolved, please follow up with Doctor Newland.  -ESSENTIAL HYPERTENSION: You have high blood pressure, which is well-controlled with your current medication, metoprolol  25 mg taken twice daily. Continue taking this medication as prescribed.  INSTRUCTIONS: Please follow up with Doctor Sherley if your headaches persist after your cough and congestion have resolved.                      Contains text generated by Abridge.                                 Contains text generated by Abridge.   "

## 2024-11-02 NOTE — Progress Notes (Signed)
 "   Patient ID: Gabrielle Waller, female    DOB: [birth year]  MRN: [id number]  CC: Cough ( Worst Cough  at night X 3 weeks /Headache X 3 weeks  )   Subjective: Gabrielle Waller is a 58 y.o. female who presents for UC visit. Son, Erla, is with her and interprets. Reports he has signed wavier in the past with our office to interpret. Pt speaks some english. Her concerns today include:  hypertension, hyperlipidemia, and type 1 diabetes mellitus,   Discussed the use of AI scribe software for clinical note transcription with the patient, who gave verbal consent to proceed.  History of Present Illness Gabrielle Waller is a 58 year old female who presents with a cough and nasal congestion.  She has been experiencing a cough productive of green phlegm for the past three weeks. There is no shortness of breath, but she notes associated nasal congestion. Ten days ago, she visited urgent care due to a sore throat and difficulty swallowing. A rapid strep test was negative, and she received a one-time dose of steroids. She has been taking ibuprofen once daily for symptom relief.  No fever, chills, or body aches. However, she experiences headaches, particularly when coughing, occurring two to three times a day and lasting about an hour. These headaches are located at the top of her head. No problems with swallowing or pain when swallowing.  One of her current medications include metoprolol  25 mg taken twice daily. She is unsure if this medication is for blood pressure or heart-related issues.    Patient Active Problem List   Diagnosis Date Noted   Abnormal weight loss 10/23/2024   Gastritis, unspecified, without bleeding 10/23/2024   Dysphagia 10/23/2024   Gastroduodenitis 10/23/2024   Hyperglycemia due to type 1 diabetes mellitus (HCC) 10/23/2024   Allergic rhinitis 03/08/2016   Health care maintenance 03/08/2016   Elevated blood pressure 03/08/2016   Subclinical hyperthyroidism 07/16/2013   Mixed  hyperlipidemia 07/14/2013   Type 1 diabetes mellitus (HCC) 07/10/2013     Medications Ordered Prior to Encounter[1]  Allergies[2]  Social History   Socioeconomic History   Marital status: Married    Spouse name: Not on file   Number of children: 4   Years of education: 12   Highest education level: Not on file  Occupational History   Occupation: Cleaning  Tobacco Use   Smoking status: Never   Smokeless tobacco: Never  Vaping Use   Vaping status: Never Used  Substance and Sexual Activity   Alcohol use: No   Drug use: No   Sexual activity: Not on file  Other Topics Concern   Not on file  Social History Narrative   Fun: walks, listening to music   Denies abuse and feels safe at home.   Social Drivers of Health   Tobacco Use: Low Risk (10/23/2024)   Patient History    Smoking Tobacco Use: Never    Smokeless Tobacco Use: Never    Passive Exposure: Not on file  Financial Resource Strain: Not on file  Food Insecurity: Not on file  Transportation Needs: Not on file  Physical Activity: Not on file  Stress: Not on file  Social Connections: Not on file  Intimate Partner Violence: Not on file  Depression (PHQ2-9): Low Risk (05/06/2023)   Depression (PHQ2-9)    PHQ-2 Score: 0  Alcohol Screen: Not on file  Housing: Not on file  Utilities: Not on file  Health Literacy: Not on file  Family History  Problem Relation Age of Onset   Diabetes Mother     Past Surgical History:  Procedure Laterality Date   APPENDECTOMY      ROS: Review of Systems Negative except as stated above  PHYSICAL EXAM: BP 130/70   Pulse 83   Ht 5' 4 (1.626 m)   Wt 115 lb 9.6 oz (52.4 kg)   SpO2 98%   BMI 19.84 kg/m   Physical Exam  General appearance - alert, well appearing, older Hispanic female and in no distress Mental status - normal mood, behavior, speech, dress, motor activity, and thought processes Nose - normal and patent, no erythema, discharge or polyps Mouth -is without  erythema or exudates Neck - supple, no significant adenopathy Chest - clear to auscultation, no wheezes, rales or rhonchi, symmetric air entry Heart - normal rate, regular rhythm, normal S1, S2, no murmurs, rubs, clicks or gallops Neurological - cranial nerves II through XII intact, motor and sensory grossly normal bilaterally, Romberg sign negative, normal gait and station      Latest Ref Rng & Units 01/07/2024    9:57 AM 09/05/2023   10:00 AM 08/07/2023    2:35 PM  CMP  Glucose 70 - 99 mg/dL 844  740  676   BUN 6 - 24 mg/dL 13  14  22    Creatinine 0.57 - 1.00 mg/dL 9.16  9.16  9.00   Sodium 134 - 144 mmol/L 146  140  137   Potassium 3.5 - 5.2 mmol/L 4.5  4.4  4.5   Chloride 96 - 106 mmol/L 106  103  100   CO2 20 - 29 mmol/L 25  29  19    Calcium  8.7 - 10.2 mg/dL 9.8  9.7  9.5   Total Protein 6.0 - 8.5 g/dL 6.7   6.7   Total Bilirubin 0.0 - 1.2 mg/dL 0.3   <9.7   Alkaline Phos 44 - 121 IU/L 111   158   AST 0 - 40 IU/L 21   56   ALT 0 - 32 IU/L 16   49    Lipid Panel     Component Value Date/Time   CHOL 137 01/07/2024 0957   TRIG 145 01/07/2024 0957   HDL 53 01/07/2024 0957   CHOLHDL 6.0 (H) 08/07/2023 1435   CHOLHDL 4 11/20/2021 1036   VLDL 37.0 11/20/2021 1036   LDLCALC 59 01/07/2024 0957   LDLDIRECT 98.0 06/03/2023 1052    CBC    Component Value Date/Time   WBC 9.3 08/01/2023 1620   RBC 4.02 08/01/2023 1620   HGB 13.3 08/01/2023 1642   HCT 39.0 08/01/2023 1642   PLT 363 08/01/2023 1620   MCV 94.8 08/01/2023 1620   MCH 30.3 08/01/2023 1620   MCHC 32.0 08/01/2023 1620   RDW 15.8 (H) 08/01/2023 1620   LYMPHSABS 1.8 08/01/2023 1620   MONOABS 0.5 08/01/2023 1620   EOSABS 0.0 08/01/2023 1620   BASOSABS 0.1 08/01/2023 1620    ASSESSMENT AND PLAN: 1. Viral upper respiratory tract infection (Primary) - Prescribed Tessalon  Perles for cough as needed. - Recommended over-the-counter Vicks Vaporub for nasal congestion, to be applied under the nose and chest, especially  at night. - Advised use of Tylenol  for headache as needed. - Instructed to follow up with Doctor Sherley if headache persists after resolution of cough and congestion. - benzonatate  (TESSALON ) 100 MG capsule; Take 1 capsule (100 mg total) by mouth 2 (two) times daily as needed for cough.  Dispense:  20 capsule; Refill: 0  2. Headache syndrome See #1 above  Patient was given the opportunity to ask questions.  Patient verbalized understanding of the plan and was able to repeat key elements of the plan.   This documentation was completed using Paediatric nurse.  Any transcriptional errors are unintentional.  No orders of the defined types were placed in this encounter.    Requested Prescriptions   Signed Prescriptions Disp Refills   benzonatate  (TESSALON ) 100 MG capsule 20 capsule 0    Sig: Take 1 capsule (100 mg total) by mouth 2 (two) times daily as needed for cough.    Return if symptoms worsen or fail to improve.  Barnie Louder, MD, FACP     [1]  Current Outpatient Medications on File Prior to Visit  Medication Sig Dispense Refill   acetaminophen  (TYLENOL ) 325 MG tablet Take 650 mg by mouth every 6 (six) hours as needed.     cetirizine  (ZYRTEC ) 10 MG tablet Take 1 tablet (10 mg total) by mouth daily. 90 tablet 1   DULoxetine  (CYMBALTA ) 60 MG capsule Take 60 mg by mouth daily.     famotidine  (PEPCID ) 20 MG tablet Take 1 tablet (20 mg total) by mouth 2 (two) times daily. 30 tablet 0   fluticasone  (FLONASE ) 50 MCG/ACT nasal spray SHAKE LIQUID AND USE 2 SPRAYS IN EACH NOSTRIL DAILY 48 g 0   gabapentin  (NEURONTIN ) 300 MG capsule TAKE 2 CAPSULES(600 MG) BY MOUTH AT BEDTIME 180 capsule 1   Glucagon  (BAQSIMI  ONE PACK) 3 MG/DOSE POWD Use in nostril for sever low sugar 1 each 1   glucose blood (ONETOUCH VERIO) test strip USE 1 STRIP TO CHECK GLUCOSE THREE TIMES DAILY 300 each 1   Insulin  Aspart FlexPen (NOVOLOG ) 100 UNIT/ML ADMINISTER 18 UNITS UNDER THE SKIN 2 TO 3  TIMES A DAY WITH MEALS 30 mL 4   Insulin  Syringe-Needle U-100 (INSULIN  SYRINGE .5CC/31GX5/16) 31G X 5/16 0.5 ML MISC Use to inject insulin  90 each 3   meloxicam  (MOBIC ) 7.5 MG tablet Take 1 tablet (7.5 mg total) by mouth daily as needed for pain. 30 tablet 0   metoprolol  tartrate (LOPRESSOR ) 25 MG tablet TAKE 1 TABLET(25 MG) BY MOUTH TWICE DAILY 60 tablet 0   OneTouch Delica Lancets 30G MISC 1 each by Does not apply route in the morning, at noon, and at bedtime. Use onetouch delica lancets to check blood sugar 3 times daily. 100 each 2   predniSONE  (DELTASONE ) 20 MG tablet Take 1 tablet (20 mg total) by mouth daily with breakfast. 5 tablet 0   rosuvastatin  (CRESTOR ) 40 MG tablet TAKE 1 TABLET(40 MG) BY MOUTH DAILY 90 tablet 0   TOUJEO  SOLOSTAR 300 UNIT/ML Solostar Pen Inject 18 Units into the skin in the morning and at bedtime. 30 mL 4   traZODone  (DESYREL ) 50 MG tablet Take 1 tablet (50 mg total) by mouth at bedtime as needed for sleep. 90 tablet 1   Vitamin D, Ergocalciferol, (DRISDOL) 50000 units CAPS capsule Take 50,000 Units by mouth every 7 (seven) days.     No current facility-administered medications on file prior to visit.  [2]  Allergies Allergen Reactions   Penicillins Itching   "

## 2024-11-06 ENCOUNTER — Other Ambulatory Visit: Payer: Self-pay | Admitting: Cardiology

## 2024-11-13 ENCOUNTER — Other Ambulatory Visit: Payer: Self-pay | Admitting: Family Medicine

## 2024-11-13 DIAGNOSIS — E1069 Type 1 diabetes mellitus with other specified complication: Secondary | ICD-10-CM

## 2024-11-13 NOTE — Telephone Encounter (Unsigned)
 Copied from CRM #[id number]. Topic: Clinical - Medication Refill >> Nov 13, 2024 11:40 AM Gattis SQUIBB wrote: Medication: Rosuvastatin  40 mg  Has the patient contacted their pharmacy? No  [id number] [address], [place] - [address] AT [address] [place] [address] Phone: [phone] Fax: [phone]   Is this the correct pharmacy for this prescription? Yes If no, delete pharmacy and type the correct one.   Has the prescription been filled recently? Yes  Is the patient out of the medication? Yes  Has the patient been seen for an appointment in the last year OR does the patient have an upcoming appointment? Yes  Can we respond through MyChart? No  Agent: Please be advised that Rx refills may take up to 3 business days. We ask that you follow-up with your pharmacy.

## 2024-11-16 MED ORDER — ROSUVASTATIN CALCIUM 40 MG PO TABS: 40.0000 mg | ORAL_TABLET | Freq: Every day | ORAL | 0 refills | Status: AC

## 2024-11-16 NOTE — Telephone Encounter (Signed)
 Requested medications are due for refill today.  yes  Requested medications are on the active medications list.  yes  Last refill. 07/31/2024 #90 0 rf  Future visit scheduled.   no  Notes to clinic.  Pt was to have a follow up appt  - did not schedule.    Requested Prescriptions  Pending Prescriptions Disp Refills   rosuvastatin  (CRESTOR ) 40 MG tablet 90 tablet 0     Cardiovascular:  Antilipid - Statins 2 Failed - 11/16/2024 11:16 AM      Failed - Lipid Panel in normal range within the last 12 months    Cholesterol, Total  Date Value Ref Range Status  01/07/2024 137 100 - 199 mg/dL Final   LDL Chol Calc (NIH)  Date Value Ref Range Status  01/07/2024 59 0 - 99 mg/dL Final   Direct LDL  Date Value Ref Range Status  06/03/2023 98.0 mg/dL Final    Comment:    Optimal:  <100 mg/dLNear or Above Optimal:  100-129 mg/dLBorderline High:  130-159 mg/dLHigh:  160-189 mg/dLVery High:  >190 mg/dL   HDL  Date Value Ref Range Status  01/07/2024 53 >39 mg/dL Final   Triglycerides  Date Value Ref Range Status  01/07/2024 145 0 - 149 mg/dL Final         Passed - Cr in normal range and within 360 days    Creatinine, Ser  Date Value Ref Range Status  01/07/2024 0.83 0.57 - 1.00 mg/dL Final         Passed - Patient is not pregnant      Passed - Valid encounter within last 12 months    Recent Outpatient Visits           2 weeks ago Viral upper respiratory tract infection   [facility] [place]. [facility] Vicci Sober B, MD   10 months ago Type 1 diabetes mellitus with other specified complication [facility])   [facility] [place]. [facility] Delbert Clam, MD   1 year ago Other cough   [facility] [place]. [facility] Delbert Clam, MD   1 year ago Screening for cervical cancer   [facility] [place]. [facility] Delbert Clam, MD   1 year ago Pain of left thigh   [facility] [place]. [facility] Delbert Clam, MD

## 2024-12-14 ENCOUNTER — Ambulatory Visit: Admitting: Endocrinology
